# Patient Record
Sex: Female | Born: 1935 | Race: White | Hispanic: No | Smoking: Former smoker
Health system: Southern US, Community
[De-identification: ages and names within clinical notes are randomized; demographics above are authoritative.]

## PROBLEM LIST (undated history)

## (undated) DIAGNOSIS — Z853 Personal history of malignant neoplasm of breast: Secondary | ICD-10-CM

## (undated) DIAGNOSIS — F32A Depression, unspecified: Secondary | ICD-10-CM

## (undated) DIAGNOSIS — E785 Hyperlipidemia, unspecified: Secondary | ICD-10-CM

## (undated) DIAGNOSIS — F419 Anxiety disorder, unspecified: Secondary | ICD-10-CM

## (undated) DIAGNOSIS — H04123 Dry eye syndrome of bilateral lacrimal glands: Secondary | ICD-10-CM

## (undated) DIAGNOSIS — I7300 Raynaud's syndrome without gangrene: Secondary | ICD-10-CM

## (undated) DIAGNOSIS — D689 Coagulation defect, unspecified: Secondary | ICD-10-CM

## (undated) DIAGNOSIS — J439 Emphysema, unspecified: Secondary | ICD-10-CM

## (undated) DIAGNOSIS — I252 Old myocardial infarction: Secondary | ICD-10-CM

## (undated) DIAGNOSIS — R55 Syncope and collapse: Secondary | ICD-10-CM

## (undated) DIAGNOSIS — R0600 Dyspnea, unspecified: Secondary | ICD-10-CM

## (undated) DIAGNOSIS — Z955 Presence of coronary angioplasty implant and graft: Secondary | ICD-10-CM

## (undated) DIAGNOSIS — R911 Solitary pulmonary nodule: Secondary | ICD-10-CM

## (undated) DIAGNOSIS — D369 Benign neoplasm, unspecified site: Secondary | ICD-10-CM

## (undated) DIAGNOSIS — F329 Major depressive disorder, single episode, unspecified: Secondary | ICD-10-CM

## (undated) DIAGNOSIS — C801 Malignant (primary) neoplasm, unspecified: Secondary | ICD-10-CM

## (undated) DIAGNOSIS — M1990 Unspecified osteoarthritis, unspecified site: Secondary | ICD-10-CM

## (undated) DIAGNOSIS — I2510 Atherosclerotic heart disease of native coronary artery without angina pectoris: Secondary | ICD-10-CM

## (undated) DIAGNOSIS — I219 Acute myocardial infarction, unspecified: Secondary | ICD-10-CM

## (undated) DIAGNOSIS — I6529 Occlusion and stenosis of unspecified carotid artery: Secondary | ICD-10-CM

## (undated) DIAGNOSIS — S9304XA Dislocation of right ankle joint, initial encounter: Secondary | ICD-10-CM

## (undated) DIAGNOSIS — I10 Essential (primary) hypertension: Secondary | ICD-10-CM

## (undated) DIAGNOSIS — K5790 Diverticulosis of intestine, part unspecified, without perforation or abscess without bleeding: Secondary | ICD-10-CM

## (undated) HISTORY — PX: TYMPANOSTOMY TUBE PLACEMENT: SHX32

## (undated) HISTORY — PX: CATARACT EXTRACTION W/ INTRAOCULAR LENS  IMPLANT, BILATERAL: SHX1307

## (undated) HISTORY — PX: OTHER SURGICAL HISTORY: SHX169

## (undated) HISTORY — DX: Benign neoplasm, unspecified site: D36.9

## (undated) HISTORY — DX: Syncope and collapse: R55

## (undated) HISTORY — DX: Atherosclerotic heart disease of native coronary artery without angina pectoris: I25.10

## (undated) HISTORY — DX: Raynaud's syndrome without gangrene: I73.00

## (undated) HISTORY — DX: Depression, unspecified: F32.A

## (undated) HISTORY — DX: Major depressive disorder, single episode, unspecified: F32.9

## (undated) HISTORY — DX: Coagulation defect, unspecified: D68.9

## (undated) HISTORY — DX: Diverticulosis of intestine, part unspecified, without perforation or abscess without bleeding: K57.90

## (undated) HISTORY — DX: Hyperlipidemia, unspecified: E78.5

## (undated) HISTORY — DX: Unspecified osteoarthritis, unspecified site: M19.90

## (undated) HISTORY — DX: Acute myocardial infarction, unspecified: I21.9

## (undated) HISTORY — DX: Essential (primary) hypertension: I10

## (undated) HISTORY — PX: CORONARY ANGIOPLASTY WITH STENT PLACEMENT: SHX49

## (undated) HISTORY — DX: Malignant (primary) neoplasm, unspecified: C80.1

## (undated) HISTORY — PX: INTRAUTERINE DEVICE INSERTION: SHX323

---

## 2004-03-12 DIAGNOSIS — D369 Benign neoplasm, unspecified site: Secondary | ICD-10-CM

## 2004-03-12 HISTORY — DX: Benign neoplasm, unspecified site: D36.9

## 2007-01-11 DIAGNOSIS — I252 Old myocardial infarction: Secondary | ICD-10-CM

## 2007-01-11 HISTORY — DX: Old myocardial infarction: I25.2

## 2007-08-10 ENCOUNTER — Encounter (HOSPITAL_COMMUNITY): Admission: RE | Admit: 2007-08-10 | Discharge: 2007-11-08 | Payer: Self-pay | Admitting: Cardiology

## 2007-11-10 ENCOUNTER — Encounter (HOSPITAL_COMMUNITY): Admission: RE | Admit: 2007-11-10 | Discharge: 2008-02-08 | Payer: Self-pay | Admitting: Cardiology

## 2008-02-10 ENCOUNTER — Encounter (HOSPITAL_COMMUNITY): Admission: RE | Admit: 2008-02-10 | Discharge: 2008-05-08 | Payer: Self-pay | Admitting: Cardiology

## 2008-05-12 ENCOUNTER — Encounter (HOSPITAL_COMMUNITY): Admission: RE | Admit: 2008-05-12 | Discharge: 2008-08-10 | Payer: Self-pay | Admitting: Cardiology

## 2008-06-27 ENCOUNTER — Encounter: Admission: RE | Admit: 2008-06-27 | Discharge: 2008-06-27 | Payer: Self-pay | Admitting: Internal Medicine

## 2008-07-31 ENCOUNTER — Ambulatory Visit: Payer: Self-pay | Admitting: Surgery

## 2008-08-11 ENCOUNTER — Encounter (HOSPITAL_COMMUNITY): Admission: RE | Admit: 2008-08-11 | Discharge: 2008-11-09 | Payer: Self-pay | Admitting: Cardiology

## 2008-11-10 ENCOUNTER — Encounter (HOSPITAL_COMMUNITY): Admission: RE | Admit: 2008-11-10 | Discharge: 2009-02-08 | Payer: Self-pay | Admitting: Cardiology

## 2009-02-09 ENCOUNTER — Encounter (HOSPITAL_COMMUNITY): Admission: RE | Admit: 2009-02-09 | Discharge: 2009-05-09 | Payer: Self-pay | Admitting: Cardiology

## 2009-04-09 ENCOUNTER — Encounter: Admission: RE | Admit: 2009-04-09 | Discharge: 2009-05-11 | Payer: Self-pay | Admitting: Internal Medicine

## 2009-04-13 ENCOUNTER — Encounter: Admission: RE | Admit: 2009-04-13 | Discharge: 2009-04-13 | Payer: Self-pay | Admitting: Cardiology

## 2009-04-19 ENCOUNTER — Inpatient Hospital Stay (HOSPITAL_COMMUNITY): Admission: AD | Admit: 2009-04-19 | Discharge: 2009-04-20 | Payer: Self-pay | Admitting: Cardiology

## 2009-05-17 ENCOUNTER — Encounter: Admission: RE | Admit: 2009-05-17 | Discharge: 2009-06-04 | Payer: Self-pay | Admitting: Internal Medicine

## 2009-06-07 ENCOUNTER — Encounter (HOSPITAL_COMMUNITY): Admission: RE | Admit: 2009-06-07 | Discharge: 2009-09-05 | Payer: Self-pay | Admitting: Cardiology

## 2009-07-31 ENCOUNTER — Encounter: Admission: RE | Admit: 2009-07-31 | Discharge: 2009-07-31 | Payer: Self-pay | Admitting: Internal Medicine

## 2009-08-02 ENCOUNTER — Encounter: Admission: RE | Admit: 2009-08-02 | Discharge: 2009-08-02 | Payer: Self-pay | Admitting: Internal Medicine

## 2009-08-14 ENCOUNTER — Encounter: Admission: RE | Admit: 2009-08-14 | Discharge: 2009-08-14 | Payer: Self-pay | Admitting: Internal Medicine

## 2009-08-21 ENCOUNTER — Encounter: Admission: RE | Admit: 2009-08-21 | Discharge: 2009-08-21 | Payer: Self-pay | Admitting: Internal Medicine

## 2009-09-04 ENCOUNTER — Encounter: Admission: RE | Admit: 2009-09-04 | Discharge: 2009-09-04 | Payer: Self-pay | Admitting: General Surgery

## 2009-09-04 ENCOUNTER — Ambulatory Visit (HOSPITAL_COMMUNITY): Admission: RE | Admit: 2009-09-04 | Discharge: 2009-09-04 | Payer: Self-pay | Admitting: General Surgery

## 2009-09-04 HISTORY — PX: OTHER SURGICAL HISTORY: SHX169

## 2009-09-05 ENCOUNTER — Ambulatory Visit: Payer: Self-pay | Admitting: Oncology

## 2009-09-20 ENCOUNTER — Encounter: Payer: Self-pay | Admitting: Pulmonary Disease

## 2009-09-20 ENCOUNTER — Ambulatory Visit (HOSPITAL_COMMUNITY): Admission: RE | Admit: 2009-09-20 | Discharge: 2009-09-20 | Payer: Self-pay | Admitting: Oncology

## 2009-09-28 ENCOUNTER — Ambulatory Visit: Admission: RE | Admit: 2009-09-28 | Discharge: 2009-11-05 | Payer: Self-pay | Admitting: Radiation Oncology

## 2009-10-02 ENCOUNTER — Encounter: Payer: Self-pay | Admitting: Pulmonary Disease

## 2009-11-16 ENCOUNTER — Ambulatory Visit: Payer: Self-pay | Admitting: Pulmonary Disease

## 2009-11-16 DIAGNOSIS — I10 Essential (primary) hypertension: Secondary | ICD-10-CM | POA: Insufficient documentation

## 2009-11-16 DIAGNOSIS — C50919 Malignant neoplasm of unspecified site of unspecified female breast: Secondary | ICD-10-CM | POA: Insufficient documentation

## 2009-11-16 DIAGNOSIS — J309 Allergic rhinitis, unspecified: Secondary | ICD-10-CM | POA: Insufficient documentation

## 2009-11-16 DIAGNOSIS — J438 Other emphysema: Secondary | ICD-10-CM

## 2009-11-16 DIAGNOSIS — I219 Acute myocardial infarction, unspecified: Secondary | ICD-10-CM | POA: Insufficient documentation

## 2009-11-16 DIAGNOSIS — J984 Other disorders of lung: Secondary | ICD-10-CM

## 2009-11-16 DIAGNOSIS — E785 Hyperlipidemia, unspecified: Secondary | ICD-10-CM

## 2009-12-14 ENCOUNTER — Ambulatory Visit: Payer: Self-pay | Admitting: Oncology

## 2009-12-18 LAB — COMPREHENSIVE METABOLIC PANEL
ALT: 25 U/L (ref 0–35)
AST: 19 U/L (ref 0–37)
Albumin: 4.1 g/dL (ref 3.5–5.2)
BUN: 11 mg/dL (ref 6–23)
Calcium: 10.1 mg/dL (ref 8.4–10.5)
Creatinine, Ser: 1.01 mg/dL (ref 0.40–1.20)
Glucose, Bld: 121 mg/dL — ABNORMAL HIGH (ref 70–99)
Potassium: 4 mEq/L (ref 3.5–5.3)
Sodium: 142 mEq/L (ref 135–145)
Total Bilirubin: 0.4 mg/dL (ref 0.3–1.2)
Total Protein: 6.9 g/dL (ref 6.0–8.3)

## 2009-12-18 LAB — CBC WITH DIFFERENTIAL/PLATELET
BASO%: 0.9 % (ref 0.0–2.0)
Basophils Absolute: 0.1 10*3/uL (ref 0.0–0.1)
HGB: 14.4 g/dL (ref 11.6–15.9)
MCV: 90.8 fL (ref 79.5–101.0)
MONO%: 7.1 % (ref 0.0–14.0)
NEUT#: 3.8 10*3/uL (ref 1.5–6.5)
Platelets: 269 10*3/uL (ref 145–400)
lymph#: 1.3 10*3/uL (ref 0.9–3.3)

## 2009-12-25 ENCOUNTER — Encounter: Payer: Self-pay | Admitting: Pulmonary Disease

## 2010-01-09 ENCOUNTER — Encounter (HOSPITAL_COMMUNITY): Admission: RE | Admit: 2010-01-09 | Discharge: 2010-02-08 | Payer: Self-pay | Admitting: Pulmonary Disease

## 2010-01-29 ENCOUNTER — Ambulatory Visit: Payer: Self-pay | Admitting: Cardiology

## 2010-02-09 ENCOUNTER — Encounter (HOSPITAL_COMMUNITY)
Admission: RE | Admit: 2010-02-09 | Discharge: 2010-05-10 | Payer: Self-pay | Source: Home / Self Care | Attending: Pulmonary Disease | Admitting: Pulmonary Disease

## 2010-04-25 ENCOUNTER — Encounter: Payer: Self-pay | Admitting: Pulmonary Disease

## 2010-05-14 ENCOUNTER — Ambulatory Visit: Payer: Self-pay | Admitting: Cardiology

## 2010-05-15 ENCOUNTER — Ambulatory Visit
Admission: RE | Admit: 2010-05-15 | Discharge: 2010-05-15 | Payer: Self-pay | Source: Home / Self Care | Attending: Pulmonary Disease | Admitting: Pulmonary Disease

## 2010-05-20 ENCOUNTER — Encounter (HOSPITAL_COMMUNITY)
Admission: RE | Admit: 2010-05-20 | Discharge: 2010-06-11 | Payer: Self-pay | Source: Home / Self Care | Attending: Cardiology | Admitting: Cardiology

## 2010-06-11 NOTE — Letter (Signed)
Summary:  Cancer Center  [facility]   Imported By: Sherian Rein 01/28/2010 09:01:36  _____________________________________________________________________  External Attachment:    Type:   Image     Comment:   External Document

## 2010-06-11 NOTE — Letter (Signed)
Summary: [facility]  [facility]   Imported By: Lester [place] 01/01/2010 09:23:16  _____________________________________________________________________  External Attachment:    Type:   Image     Comment:   External Document

## 2010-06-11 NOTE — Assessment & Plan Note (Signed)
Summary: copd/apc   Visit Type:  Initial Consult Copy to:  pcp Primary Provider/Referring Provider:  Dr. Timothy Lasso  CC:  Pt here for pulmonary consult.  History of Present Illness: [name] ex smoker for evaluation of dyspnea. Accompanied by daughter & sister who report that she seems to be breathing hard/ pursed lip breathing on exertion over short distances. She seems to be able to walk in a store or inthe parking lot but cannot climb stairs. She smoked upto 2 PP for > 60 Pys before quitting in 2009 when she another MI. She denies wheezing, nocturnal dyspnea, frequent colds. Spirometry showed FEV1 of 65% c/w mild obstruction (surprisingly better than expected). CTc hest obtained by dr Darnelle Catalan on 09/20/09 was reviewed & shows  moderate hiatal hernia & scattered < 5 mm nodules which are indeterminate. She is maintained ona  regimen of spiriva & ventolin HFA - poor technique  Preventive Screening-Counseling & Management  Alcohol-Tobacco     Smoking Status: quit     Packs/Day: 2.0     Year Started: 1955     Year Quit: 2009  Current Medications (verified): 1)  Multivitamins   Tabs (Multiple Vitamin) .... Take 1 Tablet By Mouth Once A Day 2)  Ecotrin 325 Mg Tbec (Aspirin) .... Take 1 Tablet By Mouth Once A Day 3)  Diovan Hct 80-12.5 Mg Tabs (Valsartan-Hydrochlorothiazide) .... Take 1/2 Tablet By Mouth Once A Day 4)  Plavix 75 Mg Tabs (Clopidogrel Bisulfate) .... Take 1 Tablet By Mouth Once A Day 5)  Amlodipine Besylate 5 Mg Tabs (Amlodipine Besylate) .... Take 1 Tablet By Mouth Once A Day 6)  Metoprolol Succinate 50 Mg Xr24h-Tab (Metoprolol Succinate) .... Take 1 Tablet By Mouth Two Times A Day 7)  Spiriva Handihaler 18 Mcg Caps (Tiotropium Bromide Monohydrate) .... Inhale Contents of One Capsule By Mouth Once Daily 8)  Pletal 50 Mg Tabs (Cilostazol) .... Take 1 Tablet By Mouth Two Times A Day 9)  Fish Oil 1200 Mg Caps (Omega-3 Fatty Acids) .... Take 1 Tablet By Mouth Two Times A Day 10)  Lipitor  40 Mg Tabs (Atorvastatin Calcium) .... Take 1 Tab By Mouth At Bedtime 11)  Calcium Pyruvate 750 Mg Caps (Misc Natural Products) .... Take 1 Tablet By Mouth Once A Day 12)  Alprazolam 1 Mg Tabs (Alprazolam) .... 2.5mg  Daily 13)  Remeron 30 Mg Tabs (Mirtazapine) .... Take 1 Tablet By Mouth Once A Day 14)  Fosamax 70 Mg Tabs (Alendronate Sodium) .... Take 1 Tablet By Mouth Once A Week 15)  Ventolin Hfa 108 (90 Base) Mcg/act Aers (Albuterol Sulfate) .... As Needed 16)  Nitrostat 0.4 Mg Subl (Nitroglycerin) .... As Needed  Allergies (verified): 1)  ! Pcn 2)  ! Codeine 3)  ! 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 (quit 2009) Smoking Status:  quit Packs/Day:  2.0  Review of Systems       The patient complains of shortness of breath with activity, shortness of breath at rest, non-productive cough, irregular heartbeats, acid heartburn, indigestion, sneezing, and hand/feet swelling.  The patient denies productive cough, coughing up blood,  chest pain, loss of appetite, weight change, abdominal pain, difficulty swallowing, sore throat, tooth/dental problems, headaches, nasal congestion/difficulty breathing through nose, itching, ear ache, anxiety, depression, joint stiffness or pain, rash, change in color of mucus, and fever.    Vital Signs:  Patient profile:   77 year old female Height:      59 inches Weight:      145 pounds BMI:      29.39 O2 Sat:      98 % on Room air Temp:     98.2 degrees F oral Pulse rate:   68 / minute BP sitting:   130 / 88  (right arm) Cuff size:   regular  Vitals Entered By: Zackery Barefoot CMA (November 16, 2009 1:36 PM)  O2 Flow:  Room air  Serial Vital Signs/Assessments:  Comments: Ambulatory Pulse Oximetry  Resting; HR__72___    02 Sat__92%RA___  Lap1 (185 feet)   HR__92___   02 Sat__90%RA___ Lap2 (185 feet)   HR__94___   02 Sat__92%RA___    Lap3 (185 feet)   HR__92___   02 Sat__92%RA___  _x__Test Completed without Difficulty ___Test Stopped due to:  Zackery Barefoot CMA  November 16, 2009 3:29 PM    By: Zackery Barefoot CMA   CC: Pt here for pulmonary consult Comments Medications reviewed with patient Verified contact number and pharmacy with patient Zackery Barefoot CMA  November 16, 2009 1:37 PM    Physical Exam  Additional Exam:  Gen. Pleasant, well-nourished, in no distress, normal affect, pursed lip breathing ENT - no lesions, no post nasal drip Neck: No JVD, no thyromegaly, no carotid bruits Lungs: no use of accessory muscles, no dullness to percussion, decreased BL without rales or rhonchi  Cardiovascular: Rhythm regular, heart sounds  normal, no murmurs or gallops, no peripheral edema Abdomen: soft and non-tender, no hepatosplenomegaly, BS normal. Musculoskeletal: No deformities, no cyanosis or clubbing Neuro:  alert, non focal     Pulmonary Function Test Date: 11/16/2009 2:46 PM Gender: Female  Pre-Spirometry FVC    Value: 2.14 L/min   % Pred: 94.80 % FEV1    Value: 1.69 L     Pred: 1.69 L     % Pred: 99.80 % FEV1/FVC  Value: 78.59 %     % Pred: 104.40 %  Impression & Recommendations:  Problem # 1:  EMPHYSEMA (ICD-492.8) Dyspnea out of proportion to degree of airway obstruction likley reflects some level of deconditioning. She did not desaturate on exertion. Stay on spiriva Use MDI with spacer for rescue only PU[facility] rehab Orders: Rehabilitation Referral  (Rehab) Consultation Level IV (16109) Spirometry w/Graph (94010) HFA Instruction [phone])  Problem # 2:  PULMONARY NODULE (ICD-518.89) U screening CT in 6 months with special attention to the nodules noted in 5/11 Orders: Radiology Referral (Radiology) Consultation Level IV (09811) Spirometry w/Graph (94010) HFA Instruction [phone])  Medications Added to Medication List This Visit: 1)  Multivitamins Tabs (Multiple vitamin) .... Take 1 tablet by mouth once a day 2)  Ecotrin 325 Mg Tbec (Aspirin) .... Take 1 tablet by mouth once a day 3)  Diovan Hct 80-12.5 Mg Tabs (Valsartan-hydrochlorothiazide) .... Take 1/2 tablet by mouth once a day 4)  Plavix 75 Mg Tabs (Clopidogrel bisulfate) .... Take 1 tablet by mouth once a day 5)  Amlodipine Besylate 5 Mg Tabs (Amlodipine besylate) .... Take 1 tablet by mouth once a day 6)  Metoprolol Succinate 50 Mg Xr24h-tab (Metoprolol succinate) .... Take 1 tablet by mouth two times a  day 7)  Spiriva Handihaler 18 Mcg Caps (Tiotropium bromide monohydrate) .... Inhale contents of one capsule by mouth once daily 8)  Pletal 50 Mg Tabs (Cilostazol) .... Take 1 tablet by mouth two times a day 9)  Fish Oil 1200 Mg Caps (Omega-3 fatty acids) .... Take 1 tablet by mouth two times a day 10)  Lipitor 40 Mg Tabs (Atorvastatin calcium) .... Take 1 tab by mouth at bedtime 11)  Calcium Pyruvate 750 Mg Caps (Misc natural products) .... Take 1 tablet by mouth once a day 12)  Alprazolam 1 Mg Tabs (Alprazolam) .... 2.5mg  daily 13)  Remeron 30 Mg Tabs (Mirtazapine) .... Take 1 tablet by mouth once a day 14)  Fosamax 70 Mg Tabs (Alendronate sodium) .... Take 1 tablet by mouth once a week 15)  Ventolin Hfa 108 (90 Base) Mcg/act Aers (Albuterol sulfate) .... As needed 16)  Nitrostat 0.4 Mg Subl (Nitroglycerin) .... As needed 17)  Aerochamber Plus Misc (Spacer/aero-holding chambers) .... Use with inhaler as directed  Patient Instructions: 1)  Breathing test shows slight decrease  in lung function 2)  Rehab program is recommended 3)  Spacer to use with rescue inhaler 4)  Stay on spiriva 5)  Please schedule a follow-up appointment in 6 months after CT scan 6)  CO[facility] - dr Timothy Lasso Prescriptions: AEROCHAMBER PLUS  MISC (SPACER/AERO-HOLDING CHAMBERS) use with inhaler as directed  #1 x 0   Entered by:   Zackery Barefoot CMA   Authorized by:   Comer Locket. [name] MD   Signed by:   Zackery Barefoot CMA on 11/16/2009   Method used:   Printed then faxed to ...       [facility] [address] [phone]* (retail)       [address]       [place], [place]  [address]       Ph: [phone] or [phone]       Fax: [phone]   RxID:   [id number]    Immunization History:  Influenza Immunization History:    Influenza:  historical (01/15/2009)    CardioPerfect Spirometry  ID: [id number] Patient: NAOMII, KREGER DOB: 10-20-1933 Age: 77 Years Old Sex: Female Race: White Physician: Kelechi Orgeron Height: 59 Weight: 145 PPD: 2.0 Status: Unconfirmed Recorded: 11/16/2009 2:46 PM  Parameter  Measured Predicted %Predicted FVC     2.14        2.26        94.80 FEV1     1.69        1.69        99.80 FEV1%   78.59        75.30        104.40 PEF    4.40        4.63        95.10   Interpretation: mild airway obstruction. FEV1 65% on good effort. The documented FEV1 of 99% does not reflect a good effort.

## 2010-06-12 ENCOUNTER — Encounter (HOSPITAL_COMMUNITY): Payer: Self-pay | Attending: Cardiology

## 2010-06-12 DIAGNOSIS — J984 Other disorders of lung: Secondary | ICD-10-CM | POA: Insufficient documentation

## 2010-06-12 DIAGNOSIS — E785 Hyperlipidemia, unspecified: Secondary | ICD-10-CM | POA: Insufficient documentation

## 2010-06-12 DIAGNOSIS — R0989 Other specified symptoms and signs involving the circulatory and respiratory systems: Secondary | ICD-10-CM | POA: Insufficient documentation

## 2010-06-12 DIAGNOSIS — I10 Essential (primary) hypertension: Secondary | ICD-10-CM | POA: Insufficient documentation

## 2010-06-12 DIAGNOSIS — I252 Old myocardial infarction: Secondary | ICD-10-CM | POA: Insufficient documentation

## 2010-06-12 DIAGNOSIS — Z853 Personal history of malignant neoplasm of breast: Secondary | ICD-10-CM | POA: Insufficient documentation

## 2010-06-12 DIAGNOSIS — R0609 Other forms of dyspnea: Secondary | ICD-10-CM | POA: Insufficient documentation

## 2010-06-12 DIAGNOSIS — J438 Other emphysema: Secondary | ICD-10-CM | POA: Insufficient documentation

## 2010-06-12 DIAGNOSIS — Z5189 Encounter for other specified aftercare: Secondary | ICD-10-CM | POA: Insufficient documentation

## 2010-06-12 DIAGNOSIS — Z87891 Personal history of nicotine dependence: Secondary | ICD-10-CM | POA: Insufficient documentation

## 2010-06-13 NOTE — Assessment & Plan Note (Signed)
Summary: 6 months/apc   Copy to:  pcp Primary Provider/Referring Provider:  Dr. Timothy Lasso  CC:  6 month followup.  Pt states that her dyspnea is some improved since last seen and completed pulm rehab.  Still has occ DOE.  No new complaints today.Melinda Mora  History of Present Illness: [name], breast CA survivor, ex smoker for FU of gold stg 2 COPD & stable pulm nodules since may'11. Accompanied by daughter & sister who report that she seems to be breathing hard/ pursed lip breathing on exertion over short distances. She seems to be able to walk in a store or inthe parking lot but cannot climb stairs. She smoked upto 2 PP for > 60 Pys before quitting in 2009 when she another MI. She denies wheezing, nocturnal dyspnea, frequent colds. Spirometry showed FEV1 of 65% c/w mild obstruction (surprisingly better than expected). CTc hest obtained by dr Darnelle Catalan on 09/20/09 was reviewed & shows  moderate hiatal hernia & scattered < 5 mm nodules which are indeterminate. She is maintained on a  regimen of spiriva & ventolin HFA - poor technique  May 15, 2010 2:20 PM  Completed pulm rehab, breathing is better FU CT chest - stable sub-cm nodules.  Current Medications (verified): 1)  Multivitamins   Tabs (Multiple Vitamin) .... Take 1 Tablet By Mouth Once A Day 2)  Ecotrin 325 Mg Tbec (Aspirin) .... Take 1 Tablet By Mouth Once A Day 3)  Diovan Hct 80-12.5 Mg Tabs (Valsartan-Hydrochlorothiazide) .... Take 1/2 Tablet By Mouth Once A Day 4)  Plavix 75 Mg Tabs (Clopidogrel Bisulfate) .... Take 1 Tablet By Mouth Once A Day 5)  Amlodipine Besylate 5 Mg Tabs (Amlodipine Besylate) .... Take 1 Tablet By Mouth Once A Day 6)  Metoprolol Succinate 50 Mg Xr24h-Tab (Metoprolol Succinate) .... Take 1 Tablet By Mouth Two Times A Day 7)  Spiriva Handihaler 18 Mcg Caps (Tiotropium Bromide Monohydrate) .... Inhale Contents of One Capsule By Mouth Once Daily 8)  Pletal 50 Mg Tabs (Cilostazol) .... Take 1 Tablet By Mouth Two Times A  Day 9)  Fish Oil 1200 Mg Caps (Omega-3 Fatty Acids) .... Take 1 Tablet By Mouth Two Times A Day 10)  Lipitor 40 Mg Tabs (Atorvastatin Calcium) .... Take 1 Tab By Mouth At Bedtime 11)  Calcium Pyruvate 750 Mg Caps (Misc Natural Products) .... Take 1 Tablet By Mouth Once A Day 12)  Alprazolam 1 Mg Tabs (Alprazolam) .Melinda Mora.. 1 Two Times A Day As Needed 13)  Remeron 30 Mg Tabs (Mirtazapine) .... Take 1 Tablet By Mouth Once A Day 14)  Fosamax 70 Mg Tabs (Alendronate Sodium) .... Take 1 Tablet By Mouth Once A Week 15)  Ventolin Hfa 108 (90 Base) Mcg/act Aers (Albuterol Sulfate) .... As Needed 16)  Nitrostat 0.4 Mg Subl (Nitroglycerin) .... As Needed  Allergies (verified): 1)  ! Pcn 2)  ! Codeine 3)  ! 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 (quit 2009)  Review of Systems       The patient complains of dyspnea on exertion.  The patient denies anorexia, fever, weight loss, weight gain, vision loss, decreased hearing, hoarseness, chest pain, syncope, peripheral edema, prolonged cough, headaches, hemoptysis, abdominal pain, melena, hematochezia, severe indigestion/heartburn, hematuria,  muscle weakness, suspicious skin lesions, transient blindness, difficulty walking, depression, unusual weight change, abnormal bleeding, enlarged lymph nodes, and angioedema.    Vital Signs:  Patient profile:   77 year old female Weight:      153 pounds O2 Sat:      96 % on Room air Temp:     97.5 degrees F oral Pulse rate:   59 / minute BP sitting:   116 / 70  (left arm)  Vitals Entered By: Vernie Murders (May 15, 2010 1:59  PM)  O2 Flow:  Room air  Physical Exam  Additional Exam:  Gen. Pleasant, well-nourished, in no distress, normal affect, pursed lip breathing ENT - no lesions, no post nasal drip Neck: No JVD, no thyromegaly, no carotid bruits Lungs: no use of accessory muscles, no dullness to percussion, decreased BL without rales or rhonchi  Cardiovascular: Rhythm regular, heart sounds  normal, no murmurs or gallops, no peripheral edema Musculoskeletal: No deformities, no cyanosis or clubbing      Impression & Recommendations:  Problem # 1:  EMPHYSEMA (ICD-492.8)  stay on spiriva completed pulm rehab with good results can FU with dr Timothy Lasso from here on for this. She desires to cut down on her doctor visits.  Orders: Est. Patient Level III (16109)  Problem # 2:  PULMONARY NODULE (ICD-518.89) Needs FU CT in 6 mnths - can FU with dr Darnelle Catalan Orders: Radiology Referral (Radiology) Est. Patient Level III [phone])  Medications Added to Medication List This Visit: 1)  Alprazolam 1 Mg Tabs (Alprazolam) .Melinda Mora.. 1 two times a day as needed  Patient Instructions: 1)  Copy sent to: Dr Timothy Lasso, Dr Darnelle Catalan 2)  Please schedule a follow-up appointment in 6 months. 3)  A Chest CT WITHOUT Contrast has been recommended in July. Your imaging study may require preauthorization.  4)  Stay on spiriva   Immunization History:  Influenza Immunization History:    Influenza:  historical (03/12/2010)

## 2010-06-14 ENCOUNTER — Encounter (HOSPITAL_COMMUNITY): Payer: Self-pay

## 2010-06-17 ENCOUNTER — Encounter (HOSPITAL_COMMUNITY): Payer: Self-pay

## 2010-06-18 ENCOUNTER — Other Ambulatory Visit: Payer: Self-pay | Admitting: Oncology

## 2010-06-18 ENCOUNTER — Encounter (HOSPITAL_BASED_OUTPATIENT_CLINIC_OR_DEPARTMENT_OTHER): Payer: Medicare Other | Admitting: Oncology

## 2010-06-18 DIAGNOSIS — C50919 Malignant neoplasm of unspecified site of unspecified female breast: Secondary | ICD-10-CM

## 2010-06-18 DIAGNOSIS — I2510 Atherosclerotic heart disease of native coronary artery without angina pectoris: Secondary | ICD-10-CM

## 2010-06-18 DIAGNOSIS — M810 Age-related osteoporosis without current pathological fracture: Secondary | ICD-10-CM

## 2010-06-18 DIAGNOSIS — C50419 Malignant neoplasm of upper-outer quadrant of unspecified female breast: Secondary | ICD-10-CM

## 2010-06-18 LAB — COMPREHENSIVE METABOLIC PANEL
ALT: 27 U/L (ref 0–35)
AST: 25 U/L (ref 0–37)
Albumin: 3.6 g/dL (ref 3.5–5.2)
Alkaline Phosphatase: 104 U/L (ref 39–117)
Chloride: 107 mEq/L (ref 96–112)
Creatinine, Ser: 1.06 mg/dL (ref 0.40–1.20)
Total Bilirubin: 0.7 mg/dL (ref 0.3–1.2)

## 2010-06-18 LAB — CBC WITH DIFFERENTIAL/PLATELET
Basophils Absolute: 0 10*3/uL (ref 0.0–0.1)
HCT: 43.5 % (ref 34.8–46.6)
MCH: 30.6 pg (ref 25.1–34.0)
Platelets: 263 10*3/uL (ref 145–400)
RBC: 4.77 10*6/uL (ref 3.70–5.45)
RDW: 14.9 % — ABNORMAL HIGH (ref 11.2–14.5)
WBC: 6.8 10*3/uL (ref 3.9–10.3)
lymph#: 1.4 10*3/uL (ref 0.9–3.3)

## 2010-06-19 ENCOUNTER — Encounter (HOSPITAL_COMMUNITY): Payer: Self-pay

## 2010-06-21 ENCOUNTER — Encounter (HOSPITAL_COMMUNITY): Payer: Self-pay

## 2010-06-24 ENCOUNTER — Encounter (HOSPITAL_COMMUNITY): Payer: Self-pay

## 2010-06-25 ENCOUNTER — Encounter (HOSPITAL_BASED_OUTPATIENT_CLINIC_OR_DEPARTMENT_OTHER): Payer: Medicare Other | Admitting: Oncology

## 2010-06-25 ENCOUNTER — Encounter: Payer: Self-pay | Admitting: Pulmonary Disease

## 2010-06-25 DIAGNOSIS — C50119 Malignant neoplasm of central portion of unspecified female breast: Secondary | ICD-10-CM

## 2010-06-25 DIAGNOSIS — J984 Other disorders of lung: Secondary | ICD-10-CM

## 2010-06-25 DIAGNOSIS — Z171 Estrogen receptor negative status [ER-]: Secondary | ICD-10-CM

## 2010-06-26 ENCOUNTER — Ambulatory Visit: Payer: PRIVATE HEALTH INSURANCE | Admitting: Radiation Oncology

## 2010-06-26 ENCOUNTER — Encounter (HOSPITAL_COMMUNITY): Payer: Self-pay

## 2010-06-26 ENCOUNTER — Other Ambulatory Visit: Payer: Self-pay | Admitting: Oncology

## 2010-06-26 DIAGNOSIS — Z9889 Other specified postprocedural states: Secondary | ICD-10-CM

## 2010-06-27 ENCOUNTER — Ambulatory Visit: Payer: Self-pay | Attending: Radiation Oncology | Admitting: Radiation Oncology

## 2010-06-28 ENCOUNTER — Encounter (HOSPITAL_COMMUNITY): Payer: Self-pay

## 2010-07-01 ENCOUNTER — Encounter: Payer: Self-pay | Admitting: Pulmonary Disease

## 2010-07-01 ENCOUNTER — Encounter (HOSPITAL_COMMUNITY): Payer: Self-pay

## 2010-07-03 ENCOUNTER — Encounter (HOSPITAL_COMMUNITY): Payer: Self-pay

## 2010-07-05 ENCOUNTER — Encounter (HOSPITAL_COMMUNITY): Payer: Self-pay

## 2010-07-08 ENCOUNTER — Encounter (HOSPITAL_COMMUNITY): Payer: Self-pay

## 2010-07-10 ENCOUNTER — Encounter (HOSPITAL_COMMUNITY): Payer: Self-pay

## 2010-07-12 ENCOUNTER — Encounter (HOSPITAL_COMMUNITY): Payer: Self-pay | Attending: Cardiology

## 2010-07-12 DIAGNOSIS — Z853 Personal history of malignant neoplasm of breast: Secondary | ICD-10-CM | POA: Insufficient documentation

## 2010-07-12 DIAGNOSIS — E785 Hyperlipidemia, unspecified: Secondary | ICD-10-CM | POA: Insufficient documentation

## 2010-07-12 DIAGNOSIS — I252 Old myocardial infarction: Secondary | ICD-10-CM | POA: Insufficient documentation

## 2010-07-12 DIAGNOSIS — I10 Essential (primary) hypertension: Secondary | ICD-10-CM | POA: Insufficient documentation

## 2010-07-12 DIAGNOSIS — R0609 Other forms of dyspnea: Secondary | ICD-10-CM | POA: Insufficient documentation

## 2010-07-12 DIAGNOSIS — Z5189 Encounter for other specified aftercare: Secondary | ICD-10-CM | POA: Insufficient documentation

## 2010-07-12 DIAGNOSIS — R0989 Other specified symptoms and signs involving the circulatory and respiratory systems: Secondary | ICD-10-CM | POA: Insufficient documentation

## 2010-07-12 DIAGNOSIS — J438 Other emphysema: Secondary | ICD-10-CM | POA: Insufficient documentation

## 2010-07-12 DIAGNOSIS — J984 Other disorders of lung: Secondary | ICD-10-CM | POA: Insufficient documentation

## 2010-07-12 DIAGNOSIS — Z87891 Personal history of nicotine dependence: Secondary | ICD-10-CM | POA: Insufficient documentation

## 2010-07-15 ENCOUNTER — Encounter (HOSPITAL_COMMUNITY): Payer: Self-pay

## 2010-07-17 ENCOUNTER — Encounter (HOSPITAL_COMMUNITY): Payer: Self-pay

## 2010-07-18 NOTE — Procedures (Signed)
Summary: 6MWT/[place]  6MWT/[place]   Imported By: Lester [place] 07/11/2010 10:03:23  _____________________________________________________________________  External Attachment:    Type:   Image     Comment:   External Document

## 2010-07-18 NOTE — Miscellaneous (Signed)
Summary: Progress note/Pulmonary/[facility]  Progress note/Pulmonary/[place]   Imported By: Lester [place] 07/11/2010 10:01:51  _____________________________________________________________________  External Attachment:    Type:   Image     Comment:   External Document

## 2010-07-19 ENCOUNTER — Encounter (HOSPITAL_COMMUNITY): Payer: Self-pay

## 2010-07-22 ENCOUNTER — Encounter (HOSPITAL_COMMUNITY): Payer: Self-pay

## 2010-07-23 NOTE — Letter (Signed)
Summary: [facility]   [facility]   Imported By: Kassie [place] 07/19/2010 10:59:00  _____________________________________________________________________  External Attachment:    Type:   Image     Comment:   External Document

## 2010-07-24 ENCOUNTER — Encounter (HOSPITAL_COMMUNITY): Payer: Self-pay

## 2010-07-26 ENCOUNTER — Encounter (HOSPITAL_COMMUNITY): Payer: Self-pay

## 2010-07-29 ENCOUNTER — Encounter (HOSPITAL_COMMUNITY): Payer: Self-pay

## 2010-07-30 LAB — COMPREHENSIVE METABOLIC PANEL
ALT: 28 U/L (ref 0–35)
AST: 26 U/L (ref 0–37)
Albumin: 3.6 g/dL (ref 3.5–5.2)
Alkaline Phosphatase: 93 U/L (ref 39–117)
BUN: 15 mg/dL (ref 6–23)
CO2: 28 mEq/L (ref 19–32)
Calcium: 10.1 mg/dL (ref 8.4–10.5)
Chloride: 105 mEq/L (ref 96–112)
Creatinine, Ser: 0.88 mg/dL (ref 0.4–1.2)
GFR calc non Af Amer: >60 mL/min (ref >60)
Glucose, Bld: 85 mg/dL (ref 70–99)
Total Bilirubin: 0.5 mg/dL (ref 0.3–1.2)
Total Protein: 6.7 g/dL (ref 6.0–8.3)

## 2010-07-30 LAB — URINALYSIS, ROUTINE W REFLEX MICROSCOPIC
Glucose, UA: NEGATIVE mg/dL
Nitrite: NEGATIVE
Specific Gravity, Urine: 1.014 (ref 1.005–1.030)

## 2010-07-30 LAB — CBC: MCV: 90.6 fL (ref 78.0–100.0)

## 2010-07-30 LAB — DIFFERENTIAL
Eosinophils Absolute: 0.1 10*3/uL (ref 0.0–0.7)
Eosinophils Relative: 2 % (ref 0–5)
Neutrophils Relative %: 59 % (ref 43–77)

## 2010-07-30 LAB — URINE MICROSCOPIC-ADD ON

## 2010-07-30 LAB — BUN: BUN: 15 mg/dL (ref 6–23)

## 2010-07-31 ENCOUNTER — Encounter (HOSPITAL_COMMUNITY): Payer: Self-pay

## 2010-08-02 ENCOUNTER — Encounter (HOSPITAL_COMMUNITY): Payer: Self-pay

## 2010-08-05 ENCOUNTER — Encounter (HOSPITAL_COMMUNITY): Payer: Self-pay

## 2010-08-06 ENCOUNTER — Ambulatory Visit
Admission: RE | Admit: 2010-08-06 | Discharge: 2010-08-06 | Disposition: A | Discharge status: Home / Self Care | Payer: PRIVATE HEALTH INSURANCE | Source: Ambulatory Visit | Attending: Oncology | Admitting: Oncology

## 2010-08-06 DIAGNOSIS — Z9889 Other specified postprocedural states: Secondary | ICD-10-CM

## 2010-08-07 ENCOUNTER — Encounter (HOSPITAL_COMMUNITY): Payer: Self-pay

## 2010-08-09 ENCOUNTER — Encounter (HOSPITAL_COMMUNITY): Payer: Self-pay

## 2010-08-12 ENCOUNTER — Encounter (HOSPITAL_COMMUNITY): Payer: Self-pay | Attending: Cardiology

## 2010-08-12 DIAGNOSIS — I10 Essential (primary) hypertension: Secondary | ICD-10-CM | POA: Insufficient documentation

## 2010-08-12 DIAGNOSIS — E785 Hyperlipidemia, unspecified: Secondary | ICD-10-CM | POA: Insufficient documentation

## 2010-08-12 DIAGNOSIS — I252 Old myocardial infarction: Secondary | ICD-10-CM | POA: Insufficient documentation

## 2010-08-12 DIAGNOSIS — Z87891 Personal history of nicotine dependence: Secondary | ICD-10-CM | POA: Insufficient documentation

## 2010-08-12 DIAGNOSIS — R0989 Other specified symptoms and signs involving the circulatory and respiratory systems: Secondary | ICD-10-CM | POA: Insufficient documentation

## 2010-08-12 DIAGNOSIS — R0609 Other forms of dyspnea: Secondary | ICD-10-CM | POA: Insufficient documentation

## 2010-08-12 DIAGNOSIS — J438 Other emphysema: Secondary | ICD-10-CM | POA: Insufficient documentation

## 2010-08-12 DIAGNOSIS — Z853 Personal history of malignant neoplasm of breast: Secondary | ICD-10-CM | POA: Insufficient documentation

## 2010-08-12 DIAGNOSIS — J984 Other disorders of lung: Secondary | ICD-10-CM | POA: Insufficient documentation

## 2010-08-12 DIAGNOSIS — Z5189 Encounter for other specified aftercare: Secondary | ICD-10-CM | POA: Insufficient documentation

## 2010-08-13 LAB — BASIC METABOLIC PANEL
BUN: 8 mg/dL (ref 6–23)
CO2: 22 mEq/L (ref 19–32)
Chloride: 111 mEq/L (ref 96–112)
Creatinine, Ser: 0.79 mg/dL (ref 0.4–1.2)
GFR calc Af Amer: >60 mL/min (ref >60)
GFR calc non Af Amer: >60 mL/min (ref >60)

## 2010-08-13 LAB — CBC
HCT: 41.1 % (ref 36.0–46.0)
Hemoglobin: 14 g/dL (ref 12.0–15.0)
MCV: 91.3 fL (ref 78.0–100.0)
Platelets: 248 10*3/uL (ref 150–400)
RBC: 4.5 MIL/uL (ref 3.87–5.11)
WBC: 7.6 10*3/uL (ref 4.0–10.5)

## 2010-08-14 ENCOUNTER — Encounter (HOSPITAL_COMMUNITY): Payer: Self-pay

## 2010-08-16 ENCOUNTER — Encounter (HOSPITAL_COMMUNITY): Payer: Self-pay

## 2010-08-19 ENCOUNTER — Encounter (HOSPITAL_COMMUNITY): Payer: Self-pay

## 2010-08-21 ENCOUNTER — Encounter (HOSPITAL_COMMUNITY): Payer: Self-pay

## 2010-08-23 ENCOUNTER — Encounter (HOSPITAL_COMMUNITY): Payer: Self-pay

## 2010-08-26 ENCOUNTER — Encounter (HOSPITAL_COMMUNITY): Payer: Self-pay

## 2010-08-27 ENCOUNTER — Other Ambulatory Visit: Payer: Self-pay | Admitting: Internal Medicine

## 2010-08-28 ENCOUNTER — Encounter (HOSPITAL_COMMUNITY): Payer: Self-pay

## 2010-08-30 ENCOUNTER — Encounter (HOSPITAL_COMMUNITY): Payer: Self-pay

## 2010-08-30 ENCOUNTER — Ambulatory Visit
Admission: RE | Admit: 2010-08-30 | Discharge: 2010-08-30 | Disposition: A | Discharge status: Home / Self Care | Payer: Medicare Other | Source: Ambulatory Visit | Attending: Internal Medicine | Admitting: Internal Medicine

## 2010-08-31 ENCOUNTER — Other Ambulatory Visit: Payer: Self-pay | Admitting: Cardiology

## 2010-08-31 DIAGNOSIS — E7800 Pure hypercholesterolemia, unspecified: Secondary | ICD-10-CM

## 2010-09-02 ENCOUNTER — Encounter (HOSPITAL_COMMUNITY): Payer: Self-pay

## 2010-09-02 ENCOUNTER — Other Ambulatory Visit: Payer: Self-pay | Admitting: Cardiology

## 2010-09-02 NOTE — Telephone Encounter (Signed)
Needs a refill of lipitor but would like to get the generic version of it. I can't find the file.

## 2010-09-02 NOTE — Telephone Encounter (Signed)
escribe medication per fax request  

## 2010-09-02 NOTE — Telephone Encounter (Signed)
Called requesting refill on gen Lipitor. LM that I had received refill request this AM and would refill generic.

## 2010-09-04 ENCOUNTER — Encounter (HOSPITAL_COMMUNITY): Payer: Self-pay

## 2010-09-06 ENCOUNTER — Encounter (HOSPITAL_COMMUNITY): Payer: Self-pay

## 2010-09-09 ENCOUNTER — Encounter (HOSPITAL_COMMUNITY): Payer: Self-pay

## 2010-09-11 ENCOUNTER — Encounter (HOSPITAL_COMMUNITY): Payer: Self-pay | Attending: Cardiology

## 2010-09-11 DIAGNOSIS — Z5189 Encounter for other specified aftercare: Secondary | ICD-10-CM | POA: Insufficient documentation

## 2010-09-11 DIAGNOSIS — R0609 Other forms of dyspnea: Secondary | ICD-10-CM | POA: Insufficient documentation

## 2010-09-11 DIAGNOSIS — Z87891 Personal history of nicotine dependence: Secondary | ICD-10-CM | POA: Insufficient documentation

## 2010-09-11 DIAGNOSIS — Z853 Personal history of malignant neoplasm of breast: Secondary | ICD-10-CM | POA: Insufficient documentation

## 2010-09-11 DIAGNOSIS — R0989 Other specified symptoms and signs involving the circulatory and respiratory systems: Secondary | ICD-10-CM | POA: Insufficient documentation

## 2010-09-11 DIAGNOSIS — J438 Other emphysema: Secondary | ICD-10-CM | POA: Insufficient documentation

## 2010-09-11 DIAGNOSIS — E785 Hyperlipidemia, unspecified: Secondary | ICD-10-CM | POA: Insufficient documentation

## 2010-09-11 DIAGNOSIS — I252 Old myocardial infarction: Secondary | ICD-10-CM | POA: Insufficient documentation

## 2010-09-11 DIAGNOSIS — J984 Other disorders of lung: Secondary | ICD-10-CM | POA: Insufficient documentation

## 2010-09-11 DIAGNOSIS — I10 Essential (primary) hypertension: Secondary | ICD-10-CM | POA: Insufficient documentation

## 2010-09-13 ENCOUNTER — Encounter (HOSPITAL_COMMUNITY): Payer: Self-pay

## 2010-09-16 ENCOUNTER — Encounter (HOSPITAL_COMMUNITY): Payer: Self-pay

## 2010-09-18 ENCOUNTER — Encounter (HOSPITAL_COMMUNITY): Payer: Self-pay

## 2010-09-20 ENCOUNTER — Encounter (HOSPITAL_COMMUNITY): Payer: Self-pay

## 2010-09-23 ENCOUNTER — Encounter (HOSPITAL_COMMUNITY): Payer: Self-pay

## 2010-09-24 NOTE — Assessment & Plan Note (Signed)
OFFICE VISIT   Melinda, Mora  DOB:  16-Nov-1933                                       07/31/2008  OQHUT#:[id number]   REASON FOR VISIT:  Evaluate leg pain.   HISTORY:  This is a 77 year old female that I am seeing for evaluation  of bilateral leg pain.  The patient states for the past several months  she has been increasing cramping in her legs with walking.  Her left leg  is more affected than her right and this occurs at approximately 1/4-  mile.  She denies having any rest pain.  She has no ulceration.  Pain is  relieved by rest and aggravated by walking, she is not taking pain  medicine for this.   The patient has a history of hypertension, hypercholesterolemia and  coronary artery disease.  She used to smoke but quit in 2008.   REVIEW OF SYSTEMS:  GENERAL:  Negative for fevers, chills, weight gain,  weight loss.  CARDIAC:  Positive for shortness of breath with exertion.  PULMONARY:  Negative.  GI:  Negative.  GU:  Negative.  VASCULAR:  Has pain in legs with walking.  NEURO:  Positive for dizziness.  ORTHO:  Positive for arthritis.  PSYCH:  Negative.  ENT:  Positive for recent change in eyesight.  HEME:  Negative.   PAST MEDICAL HISTORY:  Positive for hypertension, hypercholesterolemia,  history of MI, coronary artery disease, COPD.   PAST SURGICAL HISTORY:  Cardiac stent and breast biopsy.   FAMILY HISTORY:  Positive for cardiovascular disease at an early age in  her mother and father.   SOCIAL HISTORY:  She is widowed with 1 child.  She is retired.  She does  not smoke.  She has a history smoking but quit in 2008.  She does not  drink alcohol.   MEDICATIONS:  Centrum, Baby Aspirin, Diovan, Plavix, amlodipine,  metoprolol, Spiriva inhaler, Lipitor, mirtazapine.   ALLERGIES:  Penicillin and codeine.   PHYSICAL EXAMINATION:  Vital Signs:  Blood pressure 160/85, pulse 76.  General:  She is well-appearing, no distress.  HEENT:   Normocephalic,  atraumatic.  Pupils equal.  Cardiovascular:  Regular rate and rhythm,  respirations nonlabored.  Extremities:  Warm and well-perfused, there is  no ulceration, pedal pulses are not palpable.   DIAGNOSTIC STUDIES:  Vascular lab studies were performed today.  This  reveals an ankle brachial index of 0.86 on the right and 0.75 on the  left.   ASSESSMENT/PLAN:  Bilateral claudication.   Plan:  I do not feel that the patient's symptoms are lifestyle limiting  at this time and, therefore, I recommend continue with medical  management.  We discussed an exercise program.  We also need to make  sure that she has excellent blood pressure and cholesterol control.  I  have started her on cilostazol, although she is a little bit skeptical  of starting a new medicine.  I have encouraged her to continue walking  and to try cilostazol.  I have scheduled her to come back to see me in 6  months.  I do not think she is a candidate for intervention at this  time.   Jorge Ny, MD  Electronically Signed   VWB/MEDQ  D:  07/31/2008  T:  08/01/2008  Job:  [id number]   cc:   Jonny Ruiz  Bo Mcclintock, MD

## 2010-09-25 ENCOUNTER — Encounter (HOSPITAL_COMMUNITY): Payer: Self-pay

## 2010-09-27 ENCOUNTER — Encounter (HOSPITAL_COMMUNITY): Payer: Self-pay

## 2010-09-30 ENCOUNTER — Encounter (HOSPITAL_COMMUNITY): Payer: Self-pay

## 2010-10-02 ENCOUNTER — Encounter (HOSPITAL_COMMUNITY): Payer: Self-pay

## 2010-10-04 ENCOUNTER — Encounter (HOSPITAL_COMMUNITY): Payer: Self-pay

## 2010-10-07 ENCOUNTER — Encounter (HOSPITAL_COMMUNITY): Payer: Self-pay

## 2010-10-09 ENCOUNTER — Encounter (HOSPITAL_COMMUNITY): Payer: Self-pay

## 2010-10-11 ENCOUNTER — Encounter (HOSPITAL_COMMUNITY): Payer: Self-pay | Attending: Cardiology

## 2010-10-11 DIAGNOSIS — Z853 Personal history of malignant neoplasm of breast: Secondary | ICD-10-CM | POA: Insufficient documentation

## 2010-10-11 DIAGNOSIS — J984 Other disorders of lung: Secondary | ICD-10-CM | POA: Insufficient documentation

## 2010-10-11 DIAGNOSIS — Z5189 Encounter for other specified aftercare: Secondary | ICD-10-CM | POA: Insufficient documentation

## 2010-10-11 DIAGNOSIS — R0989 Other specified symptoms and signs involving the circulatory and respiratory systems: Secondary | ICD-10-CM | POA: Insufficient documentation

## 2010-10-11 DIAGNOSIS — I10 Essential (primary) hypertension: Secondary | ICD-10-CM | POA: Insufficient documentation

## 2010-10-11 DIAGNOSIS — Z87891 Personal history of nicotine dependence: Secondary | ICD-10-CM | POA: Insufficient documentation

## 2010-10-11 DIAGNOSIS — R0609 Other forms of dyspnea: Secondary | ICD-10-CM | POA: Insufficient documentation

## 2010-10-11 DIAGNOSIS — J438 Other emphysema: Secondary | ICD-10-CM | POA: Insufficient documentation

## 2010-10-11 DIAGNOSIS — E785 Hyperlipidemia, unspecified: Secondary | ICD-10-CM | POA: Insufficient documentation

## 2010-10-11 DIAGNOSIS — I252 Old myocardial infarction: Secondary | ICD-10-CM | POA: Insufficient documentation

## 2010-10-14 ENCOUNTER — Encounter (HOSPITAL_COMMUNITY): Payer: Self-pay

## 2010-10-15 ENCOUNTER — Encounter: Payer: Self-pay | Admitting: Gastroenterology

## 2010-10-15 ENCOUNTER — Ambulatory Visit (INDEPENDENT_AMBULATORY_CARE_PROVIDER_SITE_OTHER): Payer: Medicare Other | Admitting: Gastroenterology

## 2010-10-15 VITALS — BP 122/68 | HR 80 | Ht 59.0 in | Wt 152.0 lb

## 2010-10-15 DIAGNOSIS — R197 Diarrhea, unspecified: Secondary | ICD-10-CM

## 2010-10-15 DIAGNOSIS — Z86010 Personal history of colonic polyps: Secondary | ICD-10-CM

## 2010-10-15 MED ORDER — PEG-KCL-NACL-NASULF-NA ASC-C 100 G PO SOLR: 1.0000 | Freq: Once | ORAL | Status: DC

## 2010-10-15 NOTE — Progress Notes (Signed)
History of Present Illness: This is a 77 year old female here today with her daughter. She relates 3 separate episodes of urgent, watery, nonbloody diarrhea that have occurred over the past few months. In between this she is having normal to mildly constipated bowel movements which is her typical bowel pattern. She cannot relate any particular food or activity occurring around the time of her urgent bowel movements. This did lead to incontinence on one occasion. In addition she has a history of adenomatous colon polyps with last colonoscopy in November 2005 showing an adenomatous polyp in the cecum. She denies change in stool caliber, melena, hematochezia, weight loss, nausea, vomiting. She is maintained on Plavix, Pletal and ASA for coronary artery disease. She has COPD, hyperlipidemia, hypertension.  Past Medical History  Diagnosis Date  . COPD (chronic obstructive pulmonary disease)   . Hyperlipidemia   . Hypertension   . Depression   . Osteoarthritis   . Allergic rhinitis   . Breast cancer     LT1bN0  . Lung nodule   . Raynaud disease   . Adenomatous polyp 03/2004  . Heart attack   . Diverticulosis    Past Surgical History  Procedure Date  . Breast lumpectomy   . Cataract extraction   . Tympanostomy tube placement   . Angioplasty 9/09, 12/10    reports that she quit smoking about 3 years ago. She does not have any smokeless tobacco history on file. She reports that she does not drink alcohol or use illicit drugs. family history includes Breast cancer in an unspecified family member; Coronary artery disease in her father; Gout in her father; Heart attack in her father; Hypertension in her sister; Osteoarthritis in her mother; and Uterine cancer in her sister.  There is no history of Colon cancer. Allergies  Allergen Reactions  . Codeine     REACTION: numbing  . Penicillins     REACTION: hives  . Shellfish Allergy    Outpatient Encounter Prescriptions as of 10/15/2010  Medication  Sig Dispense Refill  . alendronate (FOSAMAX) 70 MG tablet Take 70 mg by mouth every 7 (seven) days. Take with a full glass of water on an empty stomach.       . ALPRAZolam (XANAX) 0.25 MG tablet Take 0.25 mg by mouth 2 (two) times daily as needed.        Marland Kitchen amLODipine (NORVASC) 5 MG tablet Take 5 mg by mouth daily.        Marland Kitchen aspirin 325 MG tablet Take 325 mg by mouth daily.        Marland Kitchen atorvastatin (LIPITOR) 40 MG tablet TAKE 1 TABLET DAILY  90 tablet  3  . Calcium Lactate 750 MG TABS Take 1 tablet by mouth daily.        . cilostazol (PLETAL) 50 MG tablet Take 50 mg by mouth 2 (two) times daily.        . clopidogrel (PLAVIX) 75 MG tablet Take 75 mg by mouth daily.        . cycloSPORINE (RESTASIS) 0.05 % ophthalmic emulsion 1 drop 2 (two) times daily.        . fish oil-omega-3 fatty acids 1000 MG capsule Take 2 g by mouth daily.        . metoprolol (LOPRESSOR) 50 MG tablet Take 50 mg by mouth 2 (two) times daily.        . mirtazapine (REMERON) 30 MG tablet Take 30 mg by mouth at bedtime.        Marland Kitchen  Multiple Vitamin (MULTIVITAMIN) tablet Take 1 tablet by mouth daily.        . nitroGLYCERIN (NITROSTAT) 0.4 MG SL tablet Place 0.4 mg under the tongue every 5 (five) minutes as needed.        . tiotropium (SPIRIVA) 18 MCG inhalation capsule Place 18 mcg into inhaler and inhale daily.        . valsartan-hydrochlorothiazide (DIOVAN-HCT) 160-12.5 MG per tablet Take 1 tablet by mouth daily.        Marland Kitchen albuterol (VENTOLIN HFA) 108 (90 BASE) MCG/ACT inhaler Inhale 2 puffs into the lungs every 6 (six) hours as needed.        . peg 3350 powder (MOVIPREP) 100 G SOLR Take 1 kit (100 g total) by mouth once.  1 kit  0    Review of Systems: Pertinent positive and negative review of systems were noted in the above HPI section. All other review of systems were otherwise negative.  Physical Exam: General: Well developed , well nourished, no acute distress Head: Normocephalic and atraumatic Eyes:  sclerae anicteric,  EOMI Ears: Normal auditory acuity Mouth: No deformity or lesions Neck: Supple, no masses or thyromegaly Lungs: Clear throughout to auscultation Heart: Regular rate and rhythm; no murmurs, rubs or bruits Abdomen: Soft, non tender and non distended. No masses, hepatosplenomegaly or hernias noted. Normal Bowel sounds Rectal: Deferred to colonoscopy Musculoskeletal: Symmetrical with no gross deformities  Skin: No lesions on visible extremities Pulses:  Normal pulses noted Extremities: No clubbing, cyanosis, edema or deformities noted Neurological: Alert oriented x 4, grossly nonfocal Cervical Nodes:  No significant cervical adenopathy Inguinal Nodes: No significant inguinal adenopathy Psychological:  Alert and cooperative. Normal mood and affect  Assessment and Recommendations:  1. Episodic diarrhea over the past few months normal bowel habits in between. She may have had a self limited problem such as an infection or food intolerance. It is possible that she has an underlying gastrointestinal disorder leading to episodic diarrhea but this seems less likely. Rule out inflammatory bowel disease and colorectal neoplasms with colonoscopy. The risks, benefits, and alternatives to colonoscopy with possible biopsy and possible polypectomy were discussed with the patient and they consent to proceed.   2. Personal history of his colon polyps. Colonoscopy as above.  3. Coronary artery disease. She is asked to hold her Pletal and Plavix for at least 5-7 days prior to her colonoscopy. Obtain clearance from her prescribing physicians.

## 2010-10-15 NOTE — Patient Instructions (Signed)
You have been scheduled for a Colonoscopy. Separate instructions given to patient. Pick up your prep from your pharmacy.  We will contact regarding your Plavix and Pletal. cc: [name], MD

## 2010-10-16 ENCOUNTER — Encounter (HOSPITAL_COMMUNITY): Payer: Self-pay

## 2010-10-17 ENCOUNTER — Telehealth: Payer: Self-pay

## 2010-10-17 NOTE — Telephone Encounter (Signed)
Spoke with patient and informed her that Dr. Timothy Lasso informed us that patient can come off Plavix and Pletal 5 days prior to her procedure. Pt agreed and verbalized understanding.

## 2010-10-18 ENCOUNTER — Encounter (HOSPITAL_COMMUNITY): Payer: Self-pay

## 2010-10-21 ENCOUNTER — Encounter (HOSPITAL_COMMUNITY): Payer: Self-pay

## 2010-10-23 ENCOUNTER — Encounter (HOSPITAL_COMMUNITY): Payer: Self-pay

## 2010-10-25 ENCOUNTER — Encounter (HOSPITAL_COMMUNITY): Payer: Self-pay

## 2010-10-28 ENCOUNTER — Encounter (HOSPITAL_COMMUNITY): Payer: Self-pay

## 2010-10-30 ENCOUNTER — Encounter (HOSPITAL_COMMUNITY): Payer: Self-pay

## 2010-11-01 ENCOUNTER — Encounter (HOSPITAL_COMMUNITY): Payer: Self-pay

## 2010-11-04 ENCOUNTER — Encounter (HOSPITAL_COMMUNITY): Payer: Self-pay

## 2010-11-06 ENCOUNTER — Encounter (HOSPITAL_COMMUNITY): Payer: Self-pay

## 2010-11-08 ENCOUNTER — Encounter (HOSPITAL_COMMUNITY): Payer: Self-pay

## 2010-11-11 ENCOUNTER — Encounter (HOSPITAL_COMMUNITY): Payer: Self-pay | Attending: Cardiology

## 2010-11-11 DIAGNOSIS — J438 Other emphysema: Secondary | ICD-10-CM | POA: Insufficient documentation

## 2010-11-11 DIAGNOSIS — Z853 Personal history of malignant neoplasm of breast: Secondary | ICD-10-CM | POA: Insufficient documentation

## 2010-11-11 DIAGNOSIS — Z87891 Personal history of nicotine dependence: Secondary | ICD-10-CM | POA: Insufficient documentation

## 2010-11-11 DIAGNOSIS — R0609 Other forms of dyspnea: Secondary | ICD-10-CM | POA: Insufficient documentation

## 2010-11-11 DIAGNOSIS — E785 Hyperlipidemia, unspecified: Secondary | ICD-10-CM | POA: Insufficient documentation

## 2010-11-11 DIAGNOSIS — R0989 Other specified symptoms and signs involving the circulatory and respiratory systems: Secondary | ICD-10-CM | POA: Insufficient documentation

## 2010-11-11 DIAGNOSIS — J984 Other disorders of lung: Secondary | ICD-10-CM | POA: Insufficient documentation

## 2010-11-11 DIAGNOSIS — Z5189 Encounter for other specified aftercare: Secondary | ICD-10-CM | POA: Insufficient documentation

## 2010-11-11 DIAGNOSIS — I252 Old myocardial infarction: Secondary | ICD-10-CM | POA: Insufficient documentation

## 2010-11-11 DIAGNOSIS — I10 Essential (primary) hypertension: Secondary | ICD-10-CM | POA: Insufficient documentation

## 2010-11-13 ENCOUNTER — Encounter (HOSPITAL_COMMUNITY): Payer: Self-pay

## 2010-11-15 ENCOUNTER — Encounter (HOSPITAL_COMMUNITY): Payer: Self-pay

## 2010-11-18 ENCOUNTER — Encounter (HOSPITAL_COMMUNITY): Payer: Self-pay

## 2010-11-20 ENCOUNTER — Encounter (HOSPITAL_COMMUNITY): Payer: Self-pay

## 2010-11-21 ENCOUNTER — Telehealth: Payer: Self-pay | Admitting: Gastroenterology

## 2010-11-21 NOTE — Telephone Encounter (Signed)
Instructed pt to drink the second half of her prep tomorrow as the instructions tell her to do, 5 hours before procedure. Pt daughter verbalized understanding. Pt daughter also expressed concern about her mothers electrolytes becoming "out of whack." Instructed daughter to monitor pt closely and call MD on call if she feels there is a need to.

## 2010-11-22 ENCOUNTER — Encounter (HOSPITAL_COMMUNITY): Payer: Self-pay

## 2010-11-22 ENCOUNTER — Encounter: Payer: Self-pay | Admitting: Gastroenterology

## 2010-11-22 ENCOUNTER — Ambulatory Visit (AMBULATORY_SURGERY_CENTER): Payer: Medicare Other | Admitting: Gastroenterology

## 2010-11-22 DIAGNOSIS — R197 Diarrhea, unspecified: Secondary | ICD-10-CM

## 2010-11-22 DIAGNOSIS — Z860100 Personal history of colon polyps, unspecified: Secondary | ICD-10-CM

## 2010-11-22 DIAGNOSIS — D126 Benign neoplasm of colon, unspecified: Secondary | ICD-10-CM

## 2010-11-22 DIAGNOSIS — K621 Rectal polyp: Secondary | ICD-10-CM

## 2010-11-22 DIAGNOSIS — K620 Anal polyp: Secondary | ICD-10-CM

## 2010-11-22 MED ORDER — SODIUM CHLORIDE 0.9 % IV SOLN: 500.0000 mL | INTRAVENOUS | Status: DC

## 2010-11-22 NOTE — Progress Notes (Signed)
PT TOOK HER PLAVIX THIS AM- HAS NOT HELD PLAVIX  PT REQUESTS THAT A COLON BX BE DONE DUE TO HER DIARRHEA

## 2010-11-22 NOTE — Patient Instructions (Signed)
Please review discharge instructions  Continue all medications  Review information on polyps, diverticulosis and high fiber diets  Push fluids

## 2010-11-25 ENCOUNTER — Telehealth: Payer: Self-pay

## 2010-11-25 ENCOUNTER — Encounter (HOSPITAL_COMMUNITY): Payer: Self-pay

## 2010-11-25 NOTE — Telephone Encounter (Signed)

## 2010-11-27 ENCOUNTER — Encounter (HOSPITAL_COMMUNITY): Payer: Self-pay

## 2010-11-27 ENCOUNTER — Encounter: Payer: Self-pay | Admitting: Gastroenterology

## 2010-11-29 ENCOUNTER — Encounter (HOSPITAL_COMMUNITY): Payer: Self-pay

## 2010-12-02 ENCOUNTER — Encounter (HOSPITAL_COMMUNITY): Payer: Self-pay

## 2010-12-04 ENCOUNTER — Encounter (HOSPITAL_COMMUNITY): Payer: Self-pay

## 2010-12-06 ENCOUNTER — Encounter (HOSPITAL_COMMUNITY): Payer: Self-pay

## 2010-12-09 ENCOUNTER — Encounter (HOSPITAL_COMMUNITY): Payer: Self-pay

## 2010-12-11 ENCOUNTER — Encounter (HOSPITAL_COMMUNITY): Payer: Self-pay | Attending: Cardiology

## 2010-12-11 DIAGNOSIS — J438 Other emphysema: Secondary | ICD-10-CM | POA: Insufficient documentation

## 2010-12-11 DIAGNOSIS — E785 Hyperlipidemia, unspecified: Secondary | ICD-10-CM | POA: Insufficient documentation

## 2010-12-11 DIAGNOSIS — Z5189 Encounter for other specified aftercare: Secondary | ICD-10-CM | POA: Insufficient documentation

## 2010-12-11 DIAGNOSIS — Z853 Personal history of malignant neoplasm of breast: Secondary | ICD-10-CM | POA: Insufficient documentation

## 2010-12-11 DIAGNOSIS — R0609 Other forms of dyspnea: Secondary | ICD-10-CM | POA: Insufficient documentation

## 2010-12-11 DIAGNOSIS — R0989 Other specified symptoms and signs involving the circulatory and respiratory systems: Secondary | ICD-10-CM | POA: Insufficient documentation

## 2010-12-11 DIAGNOSIS — I10 Essential (primary) hypertension: Secondary | ICD-10-CM | POA: Insufficient documentation

## 2010-12-11 DIAGNOSIS — J984 Other disorders of lung: Secondary | ICD-10-CM | POA: Insufficient documentation

## 2010-12-11 DIAGNOSIS — I252 Old myocardial infarction: Secondary | ICD-10-CM | POA: Insufficient documentation

## 2010-12-11 DIAGNOSIS — Z87891 Personal history of nicotine dependence: Secondary | ICD-10-CM | POA: Insufficient documentation

## 2010-12-13 ENCOUNTER — Encounter (HOSPITAL_COMMUNITY): Payer: Self-pay

## 2010-12-16 ENCOUNTER — Encounter (HOSPITAL_COMMUNITY): Payer: Self-pay

## 2010-12-18 ENCOUNTER — Encounter (HOSPITAL_COMMUNITY): Payer: Self-pay

## 2010-12-20 ENCOUNTER — Encounter (HOSPITAL_COMMUNITY): Payer: Self-pay

## 2010-12-23 ENCOUNTER — Encounter (HOSPITAL_COMMUNITY): Payer: Self-pay

## 2010-12-25 ENCOUNTER — Encounter (HOSPITAL_COMMUNITY): Payer: Self-pay

## 2010-12-27 ENCOUNTER — Encounter (HOSPITAL_COMMUNITY): Payer: Self-pay

## 2010-12-30 ENCOUNTER — Encounter (HOSPITAL_COMMUNITY): Payer: Self-pay

## 2011-01-01 ENCOUNTER — Encounter (HOSPITAL_COMMUNITY): Payer: Self-pay

## 2011-01-03 ENCOUNTER — Encounter (HOSPITAL_COMMUNITY): Payer: Self-pay

## 2011-01-06 ENCOUNTER — Encounter (HOSPITAL_COMMUNITY): Payer: Self-pay

## 2011-01-08 ENCOUNTER — Encounter (HOSPITAL_COMMUNITY): Payer: Self-pay

## 2011-01-10 ENCOUNTER — Encounter (HOSPITAL_COMMUNITY): Payer: Self-pay

## 2011-01-13 ENCOUNTER — Encounter (HOSPITAL_COMMUNITY): Payer: Self-pay | Attending: Cardiology

## 2011-01-13 DIAGNOSIS — J438 Other emphysema: Secondary | ICD-10-CM | POA: Insufficient documentation

## 2011-01-13 DIAGNOSIS — Z853 Personal history of malignant neoplasm of breast: Secondary | ICD-10-CM | POA: Insufficient documentation

## 2011-01-13 DIAGNOSIS — R0989 Other specified symptoms and signs involving the circulatory and respiratory systems: Secondary | ICD-10-CM | POA: Insufficient documentation

## 2011-01-13 DIAGNOSIS — J984 Other disorders of lung: Secondary | ICD-10-CM | POA: Insufficient documentation

## 2011-01-13 DIAGNOSIS — R0609 Other forms of dyspnea: Secondary | ICD-10-CM | POA: Insufficient documentation

## 2011-01-13 DIAGNOSIS — I252 Old myocardial infarction: Secondary | ICD-10-CM | POA: Insufficient documentation

## 2011-01-13 DIAGNOSIS — I10 Essential (primary) hypertension: Secondary | ICD-10-CM | POA: Insufficient documentation

## 2011-01-13 DIAGNOSIS — E785 Hyperlipidemia, unspecified: Secondary | ICD-10-CM | POA: Insufficient documentation

## 2011-01-13 DIAGNOSIS — Z87891 Personal history of nicotine dependence: Secondary | ICD-10-CM | POA: Insufficient documentation

## 2011-01-13 DIAGNOSIS — Z5189 Encounter for other specified aftercare: Secondary | ICD-10-CM | POA: Insufficient documentation

## 2011-01-14 ENCOUNTER — Ambulatory Visit (INDEPENDENT_AMBULATORY_CARE_PROVIDER_SITE_OTHER): Payer: Medicare Other | Admitting: Cardiology

## 2011-01-14 ENCOUNTER — Other Ambulatory Visit: Payer: Self-pay | Admitting: Cardiology

## 2011-01-14 ENCOUNTER — Encounter: Payer: Self-pay | Admitting: Cardiology

## 2011-01-14 DIAGNOSIS — I219 Acute myocardial infarction, unspecified: Secondary | ICD-10-CM

## 2011-01-14 DIAGNOSIS — J449 Chronic obstructive pulmonary disease, unspecified: Secondary | ICD-10-CM

## 2011-01-14 DIAGNOSIS — E785 Hyperlipidemia, unspecified: Secondary | ICD-10-CM

## 2011-01-14 DIAGNOSIS — I2510 Atherosclerotic heart disease of native coronary artery without angina pectoris: Secondary | ICD-10-CM

## 2011-01-14 DIAGNOSIS — I10 Essential (primary) hypertension: Secondary | ICD-10-CM

## 2011-01-14 NOTE — Patient Instructions (Signed)
Reduce ASA to 81 mg daily.  Have Dr. Timothy Lasso send me a copy of your lab work.  I will see you again in 6 months.

## 2011-01-14 NOTE — Assessment & Plan Note (Signed)
I have congratulated her on her continued smoking cessation.

## 2011-01-14 NOTE — Progress Notes (Signed)
Melinda Mora Date of Birth: [birth year]   History of Present Illness: Melinda Mora is seen today for followup. She reports that she is doing well. She is still short of breath at times. She is no longer smoking. She denies any chest pain. She continues to participate in cardiac rehabilitation. She does report that she fatigues easily but this is unchanged.  Current Outpatient Prescriptions on File Prior to Visit  Medication Sig Dispense Refill  . albuterol (VENTOLIN HFA) 108 (90 BASE) MCG/ACT inhaler Inhale 2 puffs into the lungs every 6 (six) hours as needed.        Marland Kitchen alendronate (FOSAMAX) 70 MG tablet Take 70 mg by mouth every 7 (seven) days. Take with a full glass of water on an empty stomach.       . ALPRAZolam (XANAX) 0.25 MG tablet Take 0.25 mg by mouth 2 (two) times daily as needed.        Marland Kitchen amLODipine (NORVASC) 5 MG tablet Take 5 mg by mouth daily.        Marland Kitchen aspirin 325 MG tablet Take 325 mg by mouth daily.        Marland Kitchen atorvastatin (LIPITOR) 40 MG tablet TAKE 1 TABLET DAILY  90 tablet  3  . Calcium Lactate 750 MG TABS Take 1 tablet by mouth daily.        . cilostazol (PLETAL) 50 MG tablet Take 50 mg by mouth 2 (two) times daily.       . clopidogrel (PLAVIX) 75 MG tablet Take 75 mg by mouth daily.        . cycloSPORINE (RESTASIS) 0.05 % ophthalmic emulsion 1 drop 2 (two) times daily.        . fish oil-omega-3 fatty acids 1000 MG capsule Take 2 g by mouth daily. Taking Mega Red      . metoprolol (LOPRESSOR) 50 MG tablet Take 50 mg by mouth 2 (two) times daily.        . mirtazapine (REMERON) 30 MG tablet Take 30 mg by mouth at bedtime.        . Multiple Vitamin (MULTIVITAMIN) tablet Take 1 tablet by mouth daily.        . nitroGLYCERIN (NITROSTAT) 0.4 MG SL tablet Place 0.4 mg under the tongue every 5 (five) minutes as needed.        . tiotropium (SPIRIVA) 18 MCG inhalation capsule Place 18 mcg into inhaler and inhale daily.        . valsartan-hydrochlorothiazide (DIOVAN-HCT) 160-12.5 MG  per tablet Take 1 tablet by mouth daily.        Marland Kitchen ZYMAXID 0.5 % SOLN       . peg 3350 powder (MOVIPREP) 100 G SOLR Take 1 kit (100 g total) by mouth once.  1 kit  0   Current Facility-Administered Medications on File Prior to Visit  Medication Dose Route Frequency Provider Last Rate Last Dose  . 0.9 %  sodium chloride infusion  500 mL Intravenous Continuous Meryl Dare, MD,FACG        Allergies  Allergen Reactions  . Codeine     REACTION: numbing  . Penicillins     REACTION: hives  . Shellfish Allergy     Past Medical History  Diagnosis Date  . COPD (chronic obstructive pulmonary disease)   . Hyperlipidemia   . Hypertension   . Depression   . Osteoarthritis   . Allergic rhinitis   . Breast cancer     LT1bN0  . Lung  nodule   . Raynaud disease   . Adenomatous polyp 03/2004  . Heart attack   . Diverticulosis   . CAD (coronary artery disease)     Past Surgical History  Procedure Date  . Breast lumpectomy   . Cataract extraction   . Tympanostomy tube placement   . Angioplasty 9/09, 12/10  . Colonoscopy     History  Smoking status  . Former Smoker  . Quit date: 01/11/2007  Smokeless tobacco  . Not on file    History  Alcohol Use No    Family History  Problem Relation Age of Onset  . Coronary artery disease Father     & Mother  . Heart attack Father   . Gout Father   . Osteoarthritis Mother   . Heart disease Mother   . Hypertension Sister   . Heart disease Sister   . Breast cancer      Aunts  . Uterine cancer Sister   . Colon cancer Neg Hx     Review of Systems: The review of systems is positive for a colonoscopy which demonstrated benign polyps and diverticuli.  All other systems were reviewed and are negative.  Physical Exam: BP 140/78  Pulse 64  Ht 4' 8.5" (1.435 m)  Wt 150 lb 12.8 oz (68.402 kg)  BMI 33.21 kg/m2 She is a chronically ill-appearing white female in no acute distress. Her HEENT exam is unremarkable. She is normocephalic,  atraumatic. Pupils are equal round and reactive to light and accommodation. Oropharynx is clear. Neck is supple without JVD, adenopathy, thyromegaly, or bruits. Lungs reveal dry crackles bilaterally. Cardiac exam reveals a regular rate and rhythm without gallop, murmur, or click. Abdomen is soft and nontender. She has no masses or bruits. Extremities are without edema. She does have palpable pedal pulses. She is alert and oriented x3. Cranial nerves II through XII are intact. LABORATORY DATA:   Assessment / Plan:

## 2011-01-14 NOTE — Assessment & Plan Note (Signed)
She is having no significant anginal symptoms. She will continue on her current medical therapy.

## 2011-01-14 NOTE — Assessment & Plan Note (Signed)
A pressure is well-controlled on her current therapy.

## 2011-01-15 ENCOUNTER — Encounter (HOSPITAL_COMMUNITY): Payer: Self-pay

## 2011-01-15 NOTE — Telephone Encounter (Signed)
escribe medication per fax request  

## 2011-01-17 ENCOUNTER — Encounter (HOSPITAL_COMMUNITY): Payer: Self-pay

## 2011-01-20 ENCOUNTER — Encounter (HOSPITAL_COMMUNITY): Payer: Self-pay

## 2011-01-22 ENCOUNTER — Encounter (HOSPITAL_COMMUNITY): Payer: Self-pay

## 2011-01-24 ENCOUNTER — Encounter (HOSPITAL_COMMUNITY): Payer: Self-pay

## 2011-01-27 ENCOUNTER — Encounter (HOSPITAL_COMMUNITY): Payer: Self-pay

## 2011-01-29 ENCOUNTER — Encounter (HOSPITAL_COMMUNITY): Payer: Self-pay

## 2011-01-31 ENCOUNTER — Encounter (HOSPITAL_COMMUNITY): Payer: Self-pay

## 2011-02-03 ENCOUNTER — Encounter (HOSPITAL_COMMUNITY): Payer: Self-pay

## 2011-02-05 ENCOUNTER — Encounter (HOSPITAL_COMMUNITY): Payer: Self-pay

## 2011-02-07 ENCOUNTER — Encounter (HOSPITAL_COMMUNITY): Payer: Self-pay

## 2011-02-10 ENCOUNTER — Encounter (HOSPITAL_COMMUNITY): Payer: Self-pay | Attending: Cardiology

## 2011-02-10 DIAGNOSIS — E785 Hyperlipidemia, unspecified: Secondary | ICD-10-CM | POA: Insufficient documentation

## 2011-02-10 DIAGNOSIS — Z87891 Personal history of nicotine dependence: Secondary | ICD-10-CM | POA: Insufficient documentation

## 2011-02-10 DIAGNOSIS — R0989 Other specified symptoms and signs involving the circulatory and respiratory systems: Secondary | ICD-10-CM | POA: Insufficient documentation

## 2011-02-10 DIAGNOSIS — I10 Essential (primary) hypertension: Secondary | ICD-10-CM | POA: Insufficient documentation

## 2011-02-10 DIAGNOSIS — Z5189 Encounter for other specified aftercare: Secondary | ICD-10-CM | POA: Insufficient documentation

## 2011-02-10 DIAGNOSIS — J438 Other emphysema: Secondary | ICD-10-CM | POA: Insufficient documentation

## 2011-02-10 DIAGNOSIS — J984 Other disorders of lung: Secondary | ICD-10-CM | POA: Insufficient documentation

## 2011-02-10 DIAGNOSIS — Z853 Personal history of malignant neoplasm of breast: Secondary | ICD-10-CM | POA: Insufficient documentation

## 2011-02-10 DIAGNOSIS — I252 Old myocardial infarction: Secondary | ICD-10-CM | POA: Insufficient documentation

## 2011-02-10 DIAGNOSIS — R0609 Other forms of dyspnea: Secondary | ICD-10-CM | POA: Insufficient documentation

## 2011-02-12 ENCOUNTER — Encounter (HOSPITAL_COMMUNITY): Payer: Self-pay

## 2011-02-14 ENCOUNTER — Encounter (HOSPITAL_COMMUNITY): Payer: Self-pay

## 2011-02-17 ENCOUNTER — Encounter (HOSPITAL_COMMUNITY): Payer: Self-pay

## 2011-02-19 ENCOUNTER — Encounter (HOSPITAL_COMMUNITY): Payer: Self-pay

## 2011-02-21 ENCOUNTER — Encounter (HOSPITAL_COMMUNITY): Payer: Self-pay

## 2011-02-24 ENCOUNTER — Encounter (HOSPITAL_COMMUNITY): Payer: Self-pay

## 2011-02-26 ENCOUNTER — Encounter (HOSPITAL_COMMUNITY): Payer: Self-pay

## 2011-02-28 ENCOUNTER — Encounter (HOSPITAL_COMMUNITY): Payer: Self-pay

## 2011-03-03 ENCOUNTER — Encounter (HOSPITAL_COMMUNITY): Payer: Self-pay

## 2011-03-05 ENCOUNTER — Encounter (HOSPITAL_COMMUNITY): Payer: Self-pay

## 2011-03-07 ENCOUNTER — Encounter (HOSPITAL_COMMUNITY): Payer: Self-pay

## 2011-03-10 ENCOUNTER — Encounter (HOSPITAL_COMMUNITY): Payer: Self-pay

## 2011-03-12 ENCOUNTER — Encounter (HOSPITAL_COMMUNITY): Payer: Self-pay

## 2011-03-14 ENCOUNTER — Encounter (HOSPITAL_COMMUNITY): Payer: Self-pay

## 2011-03-14 DIAGNOSIS — I10 Essential (primary) hypertension: Secondary | ICD-10-CM | POA: Insufficient documentation

## 2011-03-14 DIAGNOSIS — J984 Other disorders of lung: Secondary | ICD-10-CM | POA: Insufficient documentation

## 2011-03-14 DIAGNOSIS — Z5189 Encounter for other specified aftercare: Secondary | ICD-10-CM | POA: Insufficient documentation

## 2011-03-14 DIAGNOSIS — R0609 Other forms of dyspnea: Secondary | ICD-10-CM | POA: Insufficient documentation

## 2011-03-14 DIAGNOSIS — I252 Old myocardial infarction: Secondary | ICD-10-CM | POA: Insufficient documentation

## 2011-03-14 DIAGNOSIS — Z87891 Personal history of nicotine dependence: Secondary | ICD-10-CM | POA: Insufficient documentation

## 2011-03-14 DIAGNOSIS — J438 Other emphysema: Secondary | ICD-10-CM | POA: Insufficient documentation

## 2011-03-14 DIAGNOSIS — E785 Hyperlipidemia, unspecified: Secondary | ICD-10-CM | POA: Insufficient documentation

## 2011-03-14 DIAGNOSIS — Z853 Personal history of malignant neoplasm of breast: Secondary | ICD-10-CM | POA: Insufficient documentation

## 2011-03-14 DIAGNOSIS — R0989 Other specified symptoms and signs involving the circulatory and respiratory systems: Secondary | ICD-10-CM | POA: Insufficient documentation

## 2011-03-17 ENCOUNTER — Encounter (HOSPITAL_COMMUNITY): Payer: Self-pay

## 2011-03-18 ENCOUNTER — Other Ambulatory Visit: Payer: Self-pay | Admitting: Oncology

## 2011-03-18 ENCOUNTER — Other Ambulatory Visit (HOSPITAL_BASED_OUTPATIENT_CLINIC_OR_DEPARTMENT_OTHER): Payer: Medicare Other | Admitting: Lab

## 2011-03-18 DIAGNOSIS — C50119 Malignant neoplasm of central portion of unspecified female breast: Secondary | ICD-10-CM

## 2011-03-18 LAB — CBC WITH DIFFERENTIAL/PLATELET
BASO%: 0.7 % (ref 0.0–2.0)
Basophils Absolute: 0.1 10*3/uL (ref 0.0–0.1)
EOS%: 1.6 % (ref 0.0–7.0)
HCT: 43.8 % (ref 34.8–46.6)
HGB: 14.8 g/dL (ref 11.6–15.9)
LYMPH%: 23.1 % (ref 14.0–49.7)
MCH: 30.9 pg (ref 25.1–34.0)
MCHC: 33.7 g/dL (ref 31.5–36.0)
MCV: 91.7 fL (ref 79.5–101.0)
NEUT%: 68 % (ref 38.4–76.8)
Platelets: 268 10*3/uL (ref 145–400)
lymph#: 1.9 10*3/uL (ref 0.9–3.3)

## 2011-03-18 LAB — COMPREHENSIVE METABOLIC PANEL
ALT: 20 U/L (ref 0–35)
AST: 20 U/L (ref 0–37)
Albumin: 4 g/dL (ref 3.5–5.2)
Calcium: 9.7 mg/dL (ref 8.4–10.5)
Chloride: 105 mEq/L (ref 96–112)
Potassium: 3.9 mEq/L (ref 3.5–5.3)
Sodium: 141 mEq/L (ref 135–145)

## 2011-03-19 ENCOUNTER — Encounter (HOSPITAL_COMMUNITY): Payer: Self-pay

## 2011-03-21 ENCOUNTER — Encounter (HOSPITAL_COMMUNITY): Payer: Self-pay

## 2011-03-24 ENCOUNTER — Encounter (HOSPITAL_COMMUNITY): Payer: Self-pay

## 2011-03-25 ENCOUNTER — Telehealth: Payer: Self-pay | Admitting: Oncology

## 2011-03-25 ENCOUNTER — Ambulatory Visit (HOSPITAL_BASED_OUTPATIENT_CLINIC_OR_DEPARTMENT_OTHER): Payer: Medicare Other | Admitting: Oncology

## 2011-03-25 VITALS — BP 155/81 | HR 97 | Temp 97.3°F | Ht 59.0 in | Wt 154.2 lb

## 2011-03-25 DIAGNOSIS — C50119 Malignant neoplasm of central portion of unspecified female breast: Secondary | ICD-10-CM

## 2011-03-25 DIAGNOSIS — C50919 Malignant neoplasm of unspecified site of unspecified female breast: Secondary | ICD-10-CM

## 2011-03-25 NOTE — Telephone Encounter (Signed)
gv pt appt for [id number] °

## 2011-03-25 NOTE — Progress Notes (Signed)
ID: Melinda Mora   Interval History:  Cecillia returns today for follow and does some other heavy work. She does shopping cooking and light cleaning as well as some laundry. She is not otherwise exercising regularly. up of her breast cancer the interval history generally is unremarkable she continues to go to rehabilitation she is reasonably active for her age there is "a diet" to who comes daily to do vacuuming and also at changes the sheet on the beds  ROS:  A detailed review of systems was otherwise not contributory she denies unusual headaches visual changes cough phlegm production pleurisy shortness of breath change in bowel or bladder habits problems with bleeding rash fever or worsening night sweats.  Medications: I have reviewed the patient's current medications.   Current Outpatient Prescriptions  Medication Sig Dispense Refill  . albuterol (VENTOLIN HFA) 108 (90 BASE) MCG/ACT inhaler Inhale 2 puffs into the lungs every 6 (six) hours as needed.        Marland Kitchen alendronate (FOSAMAX) 70 MG tablet Take 70 mg by mouth every 7 (seven) days. Take with a full glass of water on an empty stomach.       . ALPRAZolam (XANAX) 0.25 MG tablet Take 0.25 mg by mouth 2 (two) times daily as needed.        Marland Kitchen amLODipine (NORVASC) 5 MG tablet TAKE 1 TABLET DAILY  90 tablet  3  . aspirin 325 MG tablet Take 325 mg by mouth daily.        Marland Kitchen atorvastatin (LIPITOR) 40 MG tablet TAKE 1 TABLET DAILY  90 tablet  3  . Calcium Lactate 750 MG TABS Take 1 tablet by mouth daily.        . cilostazol (PLETAL) 50 MG tablet Take 50 mg by mouth 2 (two) times daily.       . clopidogrel (PLAVIX) 75 MG tablet Take 75 mg by mouth daily.        . cycloSPORINE (RESTASIS) 0.05 % ophthalmic emulsion 1 drop 2 (two) times daily.        . fish oil-omega-3 fatty acids 1000 MG capsule Take 2 g by mouth daily. Taking Mega Red      . memantine (NAMENDA) 5 MG tablet Take 5 mg by mouth daily.        . metoprolol (LOPRESSOR) 50 MG tablet Take 50 mg  by mouth 2 (two) times daily.        . mirtazapine (REMERON) 30 MG tablet Take 30 mg by mouth at bedtime.        . Multiple Vitamin (MULTIVITAMIN) tablet Take 1 tablet by mouth daily.        . nitroGLYCERIN (NITROSTAT) 0.4 MG SL tablet Place 0.4 mg under the tongue every 5 (five) minutes as needed.        . peg 3350 powder (MOVIPREP) 100 G SOLR Take 1 kit (100 g total) by mouth once.  1 kit  0  . tiotropium (SPIRIVA) 18 MCG inhalation capsule Place 18 mcg into inhaler and inhale daily.        . valsartan-hydrochlorothiazide (DIOVAN-HCT) 160-12.5 MG per tablet Take 1 tablet by mouth daily.        Marland Kitchen ZYMAXID 0.5 % SOLN        Current Facility-Administered Medications  Medication Dose Route Frequency Provider Last Rate Last Dose  . 0.9 %  sodium chloride infusion  500 mL Intravenous Continuous Eliezer Bottom., MD,FACG         Objective:  Vital signs in last 24 hours: BP 155/81  Pulse 97  Temp 97.3 F (36.3 C)  Ht 4\' 11"  (1.499 m)  Wt 154 lb 3.2 oz (69.945 kg)  BMI 31.14 kg/m2   Physical Exam:    Sclerae unicteric  Oropharynx clear  No peripheral adenopathy  Lungs clear -- no rales or rhonchi  Heart regular rate and rhythm  Abdomen benign  MSK no focal spinal tenderness, no peripheral edema  Neuro nonfocal  Breast exam: Right breast no suspicious findings left breast status post lumpectomy no evidence of local recurrence  Lab Results:   BMET    Component Value Date/Time   NA 141 03/18/2011 1356   NA 141 03/18/2011 1356   K 3.9 03/18/2011 1356   K 3.9 03/18/2011 1356   CL 105 03/18/2011 1356   CL 105 03/18/2011 1356   CO2 25 03/18/2011 1356   CO2 25 03/18/2011 1356   GLUCOSE 93 03/18/2011 1356   GLUCOSE 93 03/18/2011 1356   BUN 16 03/18/2011 1356   BUN 16 03/18/2011 1356   CREATININE 0.95 03/18/2011 1356   CREATININE 0.95 03/18/2011 1356   CALCIUM 9.7 03/18/2011 1356   CALCIUM 9.7 03/18/2011 1356   GFRNONAA >60 09/20/2009 1632   GFRAA  Value: >60        The eGFR has been  calculated using the MDRD equation. This calculation has not been validated in all clinical situations. eGFR's persistently <60 mL/min signify possible Chronic Kidney Disease. 09/20/2009 1632     CMP     Component Value Date/Time   NA 141 03/18/2011 1356   NA 141 03/18/2011 1356   K 3.9 03/18/2011 1356   K 3.9 03/18/2011 1356   CL 105 03/18/2011 1356   CL 105 03/18/2011 1356   CO2 25 03/18/2011 1356   CO2 25 03/18/2011 1356   GLUCOSE 93 03/18/2011 1356   GLUCOSE 93 03/18/2011 1356   BUN 16 03/18/2011 1356   BUN 16 03/18/2011 1356   CREATININE 0.95 03/18/2011 1356   CREATININE 0.95 03/18/2011 1356   CALCIUM 9.7 03/18/2011 1356   CALCIUM 9.7 03/18/2011 1356   PROT 6.8 03/18/2011 1356   PROT 6.8 03/18/2011 1356   ALBUMIN 4.0 03/18/2011 1356   ALBUMIN 4.0 03/18/2011 1356   AST 20 03/18/2011 1356   AST 20 03/18/2011 1356   ALT 20 03/18/2011 1356   ALT 20 03/18/2011 1356   ALKPHOS 119* 03/18/2011 1356   ALKPHOS 119* 03/18/2011 1356   BILITOT 0.3 03/18/2011 1356   BILITOT 0.3 03/18/2011 1356   GFRNONAA >60 09/20/2009 1632   GFRAA  Value: >60        The eGFR has been calculated using the MDRD equation. This calculation has not been validated in all clinical situations. eGFR's persistently <60 mL/min signify possible Chronic Kidney Disease. 09/20/2009 1632    CBC    Component Value Date/Time   WBC 6.6 09/03/2009 0917   RBC 4.83 09/03/2009 0917   HGB 14.8 03/18/2011 1356   HGB 15.1* 09/03/2009 0917   HCT 43.8 03/18/2011 1356   HCT 43.8 09/03/2009 0917   PLT 268 03/18/2011 1356   PLT 244 09/03/2009 0917   MCV 91.7 03/18/2011 1356   MCV 90.6 09/03/2009 0917   MCHC 34.5 09/03/2009 0917   RDW 14.5 09/03/2009 0917   LYMPHSABS 2.1 09/03/2009 0917   MONOABS 0.5 09/03/2009 0917   EOSABS 0.1 03/18/2011 1356   EOSABS 0.1 09/03/2009 0917   BASOSABS 0.1 03/18/2011 1356   BASOSABS  0.0 09/03/2009 1478        Studies/Results: Mammography March of this year was unremarkable  Assessment:  77 year old [place] woman  status post left lumpectomy and sentinel lymph node sampling April of 2011 for a 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 Derrell Lolling in 6 months, shortly after her mammography. She has a slight elevation in the alkaline phosphatase. So far there is no trend. This is likely due to fatty liver. We will repeated before the next visit here  Frederick Klinger C 03/25/2011

## 2011-03-26 ENCOUNTER — Encounter (HOSPITAL_COMMUNITY): Payer: Self-pay

## 2011-03-28 ENCOUNTER — Encounter (HOSPITAL_COMMUNITY)
Admission: RE | Admit: 2011-03-28 | Discharge: 2011-03-28 | Disposition: A | Discharge status: Home / Self Care | Payer: Self-pay | Source: Ambulatory Visit | Attending: Cardiology | Admitting: Cardiology

## 2011-03-31 ENCOUNTER — Encounter (HOSPITAL_COMMUNITY)
Admission: RE | Admit: 2011-03-31 | Discharge: 2011-03-31 | Disposition: A | Discharge status: Home / Self Care | Payer: Self-pay | Source: Ambulatory Visit | Attending: Cardiology | Admitting: Cardiology

## 2011-04-02 ENCOUNTER — Encounter (HOSPITAL_COMMUNITY)
Admission: RE | Admit: 2011-04-02 | Discharge: 2011-04-02 | Disposition: A | Discharge status: Home / Self Care | Payer: Self-pay | Source: Ambulatory Visit | Attending: Cardiology | Admitting: Cardiology

## 2011-04-04 ENCOUNTER — Encounter (HOSPITAL_COMMUNITY): Payer: Self-pay

## 2011-04-07 ENCOUNTER — Encounter (HOSPITAL_COMMUNITY)
Admission: RE | Admit: 2011-04-07 | Discharge: 2011-04-07 | Disposition: A | Discharge status: Home / Self Care | Payer: Self-pay | Source: Ambulatory Visit | Attending: Cardiology | Admitting: Cardiology

## 2011-04-09 ENCOUNTER — Encounter (HOSPITAL_COMMUNITY)
Admission: RE | Admit: 2011-04-09 | Discharge: 2011-04-09 | Disposition: A | Discharge status: Home / Self Care | Payer: Self-pay | Source: Ambulatory Visit | Attending: Cardiology | Admitting: Cardiology

## 2011-04-11 ENCOUNTER — Encounter (HOSPITAL_COMMUNITY)
Admission: RE | Admit: 2011-04-11 | Discharge: 2011-04-11 | Disposition: A | Discharge status: Home / Self Care | Payer: Self-pay | Source: Ambulatory Visit | Attending: Cardiology | Admitting: Cardiology

## 2011-04-14 ENCOUNTER — Encounter (HOSPITAL_COMMUNITY)
Admission: RE | Admit: 2011-04-14 | Discharge: 2011-04-14 | Disposition: A | Discharge status: Home / Self Care | Payer: Self-pay | Source: Ambulatory Visit | Attending: Cardiology | Admitting: Cardiology

## 2011-04-14 DIAGNOSIS — E785 Hyperlipidemia, unspecified: Secondary | ICD-10-CM | POA: Insufficient documentation

## 2011-04-14 DIAGNOSIS — I10 Essential (primary) hypertension: Secondary | ICD-10-CM | POA: Insufficient documentation

## 2011-04-14 DIAGNOSIS — I252 Old myocardial infarction: Secondary | ICD-10-CM | POA: Insufficient documentation

## 2011-04-14 DIAGNOSIS — J984 Other disorders of lung: Secondary | ICD-10-CM | POA: Insufficient documentation

## 2011-04-14 DIAGNOSIS — Z853 Personal history of malignant neoplasm of breast: Secondary | ICD-10-CM | POA: Insufficient documentation

## 2011-04-14 DIAGNOSIS — Z5189 Encounter for other specified aftercare: Secondary | ICD-10-CM | POA: Insufficient documentation

## 2011-04-14 DIAGNOSIS — R0609 Other forms of dyspnea: Secondary | ICD-10-CM | POA: Insufficient documentation

## 2011-04-14 DIAGNOSIS — Z87891 Personal history of nicotine dependence: Secondary | ICD-10-CM | POA: Insufficient documentation

## 2011-04-14 DIAGNOSIS — J438 Other emphysema: Secondary | ICD-10-CM | POA: Insufficient documentation

## 2011-04-14 DIAGNOSIS — R0989 Other specified symptoms and signs involving the circulatory and respiratory systems: Secondary | ICD-10-CM | POA: Insufficient documentation

## 2011-04-16 ENCOUNTER — Encounter (HOSPITAL_COMMUNITY)
Admission: RE | Admit: 2011-04-16 | Discharge: 2011-04-16 | Disposition: A | Discharge status: Home / Self Care | Payer: Self-pay | Source: Ambulatory Visit | Attending: Cardiology | Admitting: Cardiology

## 2011-04-18 ENCOUNTER — Encounter (HOSPITAL_COMMUNITY): Payer: Self-pay

## 2011-04-21 ENCOUNTER — Encounter (HOSPITAL_COMMUNITY)
Admission: RE | Admit: 2011-04-21 | Discharge: 2011-04-21 | Disposition: A | Discharge status: Home / Self Care | Payer: Self-pay | Source: Ambulatory Visit | Attending: Cardiology | Admitting: Cardiology

## 2011-04-23 ENCOUNTER — Encounter (HOSPITAL_COMMUNITY)
Admission: RE | Admit: 2011-04-23 | Discharge: 2011-04-23 | Disposition: A | Discharge status: Home / Self Care | Payer: Self-pay | Source: Ambulatory Visit | Attending: Cardiology | Admitting: Cardiology

## 2011-04-25 ENCOUNTER — Encounter (HOSPITAL_COMMUNITY)
Admission: RE | Admit: 2011-04-25 | Discharge: 2011-04-25 | Disposition: A | Discharge status: Home / Self Care | Payer: Self-pay | Source: Ambulatory Visit | Attending: Cardiology | Admitting: Cardiology

## 2011-04-28 ENCOUNTER — Encounter (HOSPITAL_COMMUNITY)
Admission: RE | Admit: 2011-04-28 | Discharge: 2011-04-28 | Disposition: A | Discharge status: Home / Self Care | Payer: Self-pay | Source: Ambulatory Visit | Attending: Cardiology | Admitting: Cardiology

## 2011-04-30 ENCOUNTER — Encounter (HOSPITAL_COMMUNITY)
Admission: RE | Admit: 2011-04-30 | Discharge: 2011-04-30 | Disposition: A | Discharge status: Home / Self Care | Payer: Self-pay | Source: Ambulatory Visit | Attending: Cardiology | Admitting: Cardiology

## 2011-05-02 ENCOUNTER — Encounter (HOSPITAL_COMMUNITY)
Admission: RE | Admit: 2011-05-02 | Discharge: 2011-05-02 | Disposition: A | Discharge status: Home / Self Care | Payer: Self-pay | Source: Ambulatory Visit | Attending: Cardiology | Admitting: Cardiology

## 2011-05-05 ENCOUNTER — Encounter (HOSPITAL_COMMUNITY): Payer: Self-pay

## 2011-05-07 ENCOUNTER — Encounter (HOSPITAL_COMMUNITY): Payer: Self-pay

## 2011-05-09 ENCOUNTER — Encounter (HOSPITAL_COMMUNITY): Payer: Self-pay

## 2011-05-12 ENCOUNTER — Encounter (HOSPITAL_COMMUNITY): Payer: Self-pay

## 2011-05-13 DIAGNOSIS — R55 Syncope and collapse: Secondary | ICD-10-CM

## 2011-05-13 HISTORY — DX: Syncope and collapse: R55

## 2011-05-14 ENCOUNTER — Encounter (HOSPITAL_COMMUNITY): Payer: Self-pay

## 2011-05-16 ENCOUNTER — Encounter (HOSPITAL_COMMUNITY)
Admission: RE | Admit: 2011-05-16 | Discharge: 2011-05-16 | Disposition: A | Discharge status: Home / Self Care | Payer: Self-pay | Source: Ambulatory Visit | Attending: Cardiology | Admitting: Cardiology

## 2011-05-16 DIAGNOSIS — R0609 Other forms of dyspnea: Secondary | ICD-10-CM | POA: Insufficient documentation

## 2011-05-16 DIAGNOSIS — J438 Other emphysema: Secondary | ICD-10-CM | POA: Insufficient documentation

## 2011-05-16 DIAGNOSIS — E785 Hyperlipidemia, unspecified: Secondary | ICD-10-CM | POA: Insufficient documentation

## 2011-05-16 DIAGNOSIS — Z853 Personal history of malignant neoplasm of breast: Secondary | ICD-10-CM | POA: Insufficient documentation

## 2011-05-16 DIAGNOSIS — J984 Other disorders of lung: Secondary | ICD-10-CM | POA: Insufficient documentation

## 2011-05-16 DIAGNOSIS — I10 Essential (primary) hypertension: Secondary | ICD-10-CM | POA: Insufficient documentation

## 2011-05-16 DIAGNOSIS — R0989 Other specified symptoms and signs involving the circulatory and respiratory systems: Secondary | ICD-10-CM | POA: Insufficient documentation

## 2011-05-16 DIAGNOSIS — I252 Old myocardial infarction: Secondary | ICD-10-CM | POA: Insufficient documentation

## 2011-05-16 DIAGNOSIS — Z87891 Personal history of nicotine dependence: Secondary | ICD-10-CM | POA: Insufficient documentation

## 2011-05-16 DIAGNOSIS — Z5189 Encounter for other specified aftercare: Secondary | ICD-10-CM | POA: Insufficient documentation

## 2011-05-19 ENCOUNTER — Encounter (HOSPITAL_COMMUNITY)
Admission: RE | Admit: 2011-05-19 | Discharge: 2011-05-19 | Disposition: A | Discharge status: Home / Self Care | Payer: Self-pay | Source: Ambulatory Visit | Attending: Cardiology | Admitting: Cardiology

## 2011-05-20 ENCOUNTER — Telehealth: Payer: Self-pay | Admitting: Cardiology

## 2011-05-20 NOTE — Telephone Encounter (Signed)
Patient called, stating she has had 2 syncopal episodes recently.States she had one the first of 12/12 and one the week before Christmas.States she had no warning signs,no dizziness,no chest pain,no fast heart beat.States she feels good.Spoke with Dr.Jordan he advised to make appointment with Norma Fredrickson NP.Appointment scheduled with Norma Fredrickson NP 05/22/11 at 10:30 am.

## 2011-05-20 NOTE — Telephone Encounter (Signed)
Pt had a couple of episodes of syncope, one about 2 weeks ago, and one just prior to that, felt fine right before and fine right after, no problems since then but dtr wanted her to call to see what she needs to do

## 2011-05-21 ENCOUNTER — Encounter: Payer: Self-pay | Admitting: Nurse Practitioner

## 2011-05-21 ENCOUNTER — Encounter (HOSPITAL_COMMUNITY)
Admission: RE | Admit: 2011-05-21 | Discharge: 2011-05-21 | Disposition: A | Discharge status: Home / Self Care | Payer: Self-pay | Source: Ambulatory Visit | Attending: Cardiology | Admitting: Cardiology

## 2011-05-22 ENCOUNTER — Encounter: Payer: Self-pay | Admitting: Nurse Practitioner

## 2011-05-22 ENCOUNTER — Ambulatory Visit (HOSPITAL_COMMUNITY): Payer: Medicare Other | Attending: Cardiology

## 2011-05-22 ENCOUNTER — Ambulatory Visit (INDEPENDENT_AMBULATORY_CARE_PROVIDER_SITE_OTHER)
Admission: RE | Admit: 2011-05-22 | Discharge: 2011-05-22 | Disposition: A | Discharge status: Home / Self Care | Payer: Medicare Other | Source: Ambulatory Visit | Attending: Nurse Practitioner | Admitting: Nurse Practitioner

## 2011-05-22 ENCOUNTER — Ambulatory Visit (INDEPENDENT_AMBULATORY_CARE_PROVIDER_SITE_OTHER): Payer: Medicare Other | Admitting: Nurse Practitioner

## 2011-05-22 VITALS — BP 110/72 | HR 70 | Ht 60.0 in | Wt 154.0 lb

## 2011-05-22 DIAGNOSIS — I2510 Atherosclerotic heart disease of native coronary artery without angina pectoris: Secondary | ICD-10-CM

## 2011-05-22 DIAGNOSIS — R55 Syncope and collapse: Secondary | ICD-10-CM | POA: Insufficient documentation

## 2011-05-22 DIAGNOSIS — R5381 Other malaise: Secondary | ICD-10-CM

## 2011-05-22 DIAGNOSIS — R5383 Other fatigue: Secondary | ICD-10-CM | POA: Insufficient documentation

## 2011-05-22 DIAGNOSIS — E785 Hyperlipidemia, unspecified: Secondary | ICD-10-CM | POA: Insufficient documentation

## 2011-05-22 DIAGNOSIS — I10 Essential (primary) hypertension: Secondary | ICD-10-CM | POA: Insufficient documentation

## 2011-05-22 HISTORY — PX: US ECHOCARDIOGRAPHY: HXRAD669

## 2011-05-22 LAB — BASIC METABOLIC PANEL
BUN: 18 mg/dL (ref 6–23)
CO2: 26 mEq/L (ref 19–32)
Calcium: 10 mg/dL (ref 8.4–10.5)
Chloride: 106 mEq/L (ref 96–112)
Creatinine, Ser: 1.1 mg/dL (ref 0.4–1.2)
GFR: 52 mL/min — ABNORMAL LOW (ref >60.00)
Glucose, Bld: 105 mg/dL — ABNORMAL HIGH (ref 70–99)
Potassium: 4.3 mEq/L (ref 3.5–5.1)
Sodium: 140 mEq/L (ref 135–145)

## 2011-05-22 LAB — CBC WITH DIFFERENTIAL/PLATELET
Basophils Absolute: 0 10*3/uL (ref 0.0–0.1)
Basophils Relative: 0.3 % (ref 0.0–3.0)
Eosinophils Absolute: 0.1 10*3/uL (ref 0.0–0.7)
Eosinophils Relative: 1.3 % (ref 0.0–5.0)
HCT: 44 % (ref 36.0–46.0)
Hemoglobin: 14.8 g/dL (ref 12.0–15.0)
Lymphocytes Relative: 20.9 % (ref 12.0–46.0)
Lymphs Abs: 1.7 10*3/uL (ref 0.7–4.0)
MCHC: 33.7 g/dL (ref 30.0–36.0)
MCV: 92.1 fl (ref 78.0–100.0)
Monocytes Absolute: 0.5 10*3/uL (ref 0.1–1.0)
Monocytes Relative: 6 % (ref 3.0–12.0)
Neutro Abs: 5.8 10*3/uL (ref 1.4–7.7)
Neutrophils Relative %: 71.5 % (ref 43.0–77.0)
Platelets: 270 10*3/uL (ref 150.0–400.0)
RBC: 4.77 Mil/uL (ref 3.87–5.11)
RDW: 15.5 % — ABNORMAL HIGH (ref 11.5–14.6)
WBC: 8.1 10*3/uL (ref 4.5–10.5)

## 2011-05-22 LAB — TSH: TSH: 1.02 u[IU]/mL (ref 0.35–5.50)

## 2011-05-22 NOTE — Patient Instructions (Addendum)
We are going to do several tests to evaluate your passing out spells. This will include labs, a scan on your head, an ultrasound of your heart, another stress test and wearing a monitor to look at your rhythm.   You need to not be driving while we are trying to find out why you passed out.   We will see you back in one month.  Call the [facility] office at [phone] if you have any questions, problems or concerns.

## 2011-05-22 NOTE — Assessment & Plan Note (Addendum)
Her spells are very concerning. She does need further evaluation. I have ordered labs today. We are placing an event monitor. We will arrange for carotid dopplers, CT of the head and echo. She does need stress testing as well. I have asked her to not drive. She has no warning with these spells. I have spoken with Dr. [place] and reviewed her case with him. He is in agreement with my plan. I will see her in a month. Patient is agreeable to this plan and will call if any problems develop in the interim.

## 2011-05-22 NOTE — Progress Notes (Signed)
[name] Date of Birth: Sep 26, 1933 Medical Record #[id number]  History of Present Illness: Ms. Melinda Mora is seen today for a work in visit. She is seen for Dr. Sw[place]il[place]. She is a 78 year old female who has a history of known CAD with prior PCI. She has multiple other health issues. She was last seen in the office in September of last year. She come in today after calling in to report 2 syncopal episodes that have recently occurred. She had her first episode on December the 12th. She had just gotten home from the grocery store and was standing at the bar in her house. The next thing she knew, she was on the floor. The second episode occurred about a week before Christmas. She had just returned from going out to dinner. She was walking up her walkway and "fell out again". She was incontinent of urine for this episode. No bowel incontinence. No tongue biting. No apparent seizure activity. The second episode was witnessed by her family. They did not note any other findings and she did not seek medical attention. She has had no further episodes. She had no warning. She is not having chest pain. She continues to have issues with her balance but these spells were not from being unsteady. She has not suffered any significant injury. She is still driving.   Current Outpatient Prescriptions on File Prior to Visit  Medication Sig Dispense Refill  . albuterol (VENTOLIN HFA) 108 (90 BASE) MCG/ACT inhaler Inhale 2 puffs into the lungs every 6 (six) hours as needed.        Marland Kitchen alendronate (FOSAMAX) 70 MG tablet Take 70 mg by mouth every 7 (seven) days. Take with a full glass of water on an empty stomach.       . ALPRAZolam (XANAX) 0.25 MG tablet Take 0.25 mg by mouth 2 (two) times daily as needed.        Marland Kitchen amLODipine (NORVASC) 5 MG tablet TAKE 1 TABLET DAILY  90 tablet  3  . aspirin 325 MG tablet Take 325 mg by mouth daily.        Marland Kitchen atorvastatin (LIPITOR) 40 MG tablet TAKE 1 TABLET DAILY  90 tablet  3  . Calcium  Lactate 750 MG TABS Take 1 tablet by mouth daily.        . cilostazol (PLETAL) 50 MG tablet Take 50 mg by mouth 2 (two) times daily.       . clopidogrel (PLAVIX) 75 MG tablet Take 75 mg by mouth daily.        . cycloSPORINE (RESTASIS) 0.05 % ophthalmic emulsion 1 drop 2 (two) times daily.        . fish oil-omega-3 fatty acids 1000 MG capsule Take 2 g by mouth daily. Taking Mega Red      . memantine (NAMENDA) 5 MG tablet Take 10 mg by mouth daily.       . metoprolol (TOPROL-XL) 50 MG 24 hr tablet Take 50 mg by mouth 2 (two) times daily.      . mirtazapine (REMERON) 30 MG tablet Take 30 mg by mouth at bedtime.        . Multiple Vitamin (MULTIVITAMIN) tablet Take 1 tablet by mouth daily.        . nitroGLYCERIN (NITROSTAT) 0.4 MG SL tablet Place 0.4 mg under the tongue every 5 (five) minutes as needed.        . tiotropium (SPIRIVA) 18 MCG inhalation capsule Place 18 mcg into inhaler and  inhale daily.        . valsartan-hydrochlorothiazide (DIOVAN-HCT) 160-12.5 MG per tablet Take 0.5 tablets by mouth daily.       Marland Kitchen DISCONTD: aspirin 81 MG tablet Take 81 mg by mouth daily.         Current Facility-Administered Medications on File Prior to Visit  Medication Dose Route Frequency Provider Last Rate Last Dose  . 0.9 %  sodium chloride infusion  500 mL Intravenous Continuous Eliezer Bottom., MD,FACG        Allergies  Allergen Reactions  . Codeine     REACTION: numbing  . Penicillins     REACTION: hives  . Shellfish Allergy     Past Medical History  Diagnosis Date  . COPD (chronic obstructive pulmonary disease)   . Hyperlipidemia   . Hypertension   . Depression   . Osteoarthritis   . Allergic rhinitis   . Breast cancer     LT1bN0  . Lung nodule     Had follow up CT in Jan 2012  . Raynaud disease   . Adenomatous polyp 03/2004  . Heart attack   . Diverticulosis   . CAD (coronary artery disease)     Remote inferior MI in 2008. Extensive stenting of the RCA, s/p repeat stenting of  the RCA in Dec 2010 due to restenosis    Past Surgical History  Procedure Date  . Breast lumpectomy   . Cataract extraction   . Tympanostomy tube placement   . Angioplasty 9/09, 12/10  . Colonoscopy   . Coronary stent placement 04/19/2009    EF 65%  . US echocardiography 04/29/2007    ef 60%  . Cardiovascular stress test 04/10/2009    EF 65%. MODERATE AREA OF MODERATE ISCHEMIA IN THE INFERIOR WALL    History  Smoking status  . Former Smoker  . Quit date: 01/11/2007  Smokeless tobacco  . Not on file    History  Alcohol Use No    Family History  Problem Relation Age of Onset  . Coronary artery disease Father     & Mother  . Heart attack Father   . Gout Father   . Osteoarthritis Mother   . Heart disease Mother   . Hypertension Sister   . Heart disease Sister   . Breast cancer      Aunts  . Uterine cancer Sister   . Colon cancer Neg Hx     Review of Systems: The review of systems is positive for fatigue. She has chronic shortness of breath. Has chronic issues with her balance.  She does fall frequently.  All other systems were reviewed and are negative.  Physical Exam: BP 110/72  Pulse 70  Ht 5' (1.524 m)  Wt 154 lb (69.854 kg)  BMI 30.08 kg/m2 Patient is very pleasant and in no acute distress. Skin is warm and dry. Color is normal.  HEENT is unremarkable. Normocephalic/atraumatic. PERRL. Sclera are nonicteric. Neck is supple. No masses. No JVD. Lungs are clear. Cardiac exam shows a regular rate and rhythm. Abdomen is soft. Extremities are without edema. Gait and ROM are intact. No gross neurologic deficits noted.  LABORATORY DATA: EKG shows sinus rhythm. She has inferolateral ST/T wave changes that appear unchanged.    Assessment / Plan:

## 2011-05-22 NOTE — Assessment & Plan Note (Signed)
She is currently not having any chest pain. Last PCI in 2010 following abnormal stress test.

## 2011-05-23 ENCOUNTER — Telehealth (HOSPITAL_COMMUNITY): Payer: Self-pay | Admitting: *Deleted

## 2011-05-23 ENCOUNTER — Encounter (HOSPITAL_COMMUNITY): Payer: Self-pay

## 2011-05-23 ENCOUNTER — Encounter: Payer: Self-pay | Admitting: Cardiology

## 2011-05-23 DIAGNOSIS — R55 Syncope and collapse: Secondary | ICD-10-CM

## 2011-05-23 NOTE — Telephone Encounter (Signed)
[phone] Returned call to patient this morning. Pt informed me that she would need to discharge from the program at this time per MD orders. Per pt she passed out twice over the holidays and is being evaluated for cause of syncopal episodes. Pt states she's been instructed to hold exercise for 30 days. Pt will d/c from the program at this time. Pt was informed of monthly fee already assessed for the month of January. Pt plans to re-enroll when cleared by her physician.

## 2011-05-23 NOTE — Telephone Encounter (Signed)
Error

## 2011-05-26 ENCOUNTER — Encounter (HOSPITAL_COMMUNITY): Payer: Self-pay

## 2011-05-26 NOTE — Telephone Encounter (Signed)
Patient's daughter was called and told patient's labs and echo done 05/22/11 are okay.

## 2011-05-26 NOTE — Telephone Encounter (Signed)
New Msg: pt daughter calling wanting to speak with nurse/MD regarding pt recent appt and upcoming appts/procedures/tests. Please return pt daughter call to discuss further.

## 2011-05-27 ENCOUNTER — Ambulatory Visit (HOSPITAL_COMMUNITY): Payer: Medicare Other | Attending: Cardiovascular Disease | Admitting: Radiology

## 2011-05-27 ENCOUNTER — Telehealth: Payer: Self-pay | Admitting: Cardiology

## 2011-05-27 DIAGNOSIS — R0602 Shortness of breath: Secondary | ICD-10-CM

## 2011-05-27 DIAGNOSIS — Z9861 Coronary angioplasty status: Secondary | ICD-10-CM | POA: Insufficient documentation

## 2011-05-27 DIAGNOSIS — I4949 Other premature depolarization: Secondary | ICD-10-CM

## 2011-05-27 DIAGNOSIS — J4489 Other specified chronic obstructive pulmonary disease: Secondary | ICD-10-CM | POA: Insufficient documentation

## 2011-05-27 DIAGNOSIS — E785 Hyperlipidemia, unspecified: Secondary | ICD-10-CM | POA: Insufficient documentation

## 2011-05-27 DIAGNOSIS — R5381 Other malaise: Secondary | ICD-10-CM | POA: Insufficient documentation

## 2011-05-27 DIAGNOSIS — I252 Old myocardial infarction: Secondary | ICD-10-CM | POA: Insufficient documentation

## 2011-05-27 DIAGNOSIS — R55 Syncope and collapse: Secondary | ICD-10-CM

## 2011-05-27 DIAGNOSIS — Z87891 Personal history of nicotine dependence: Secondary | ICD-10-CM | POA: Insufficient documentation

## 2011-05-27 DIAGNOSIS — J449 Chronic obstructive pulmonary disease, unspecified: Secondary | ICD-10-CM | POA: Insufficient documentation

## 2011-05-27 DIAGNOSIS — I2510 Atherosclerotic heart disease of native coronary artery without angina pectoris: Secondary | ICD-10-CM | POA: Insufficient documentation

## 2011-05-27 DIAGNOSIS — I10 Essential (primary) hypertension: Secondary | ICD-10-CM | POA: Insufficient documentation

## 2011-05-27 DIAGNOSIS — Z8249 Family history of ischemic heart disease and other diseases of the circulatory system: Secondary | ICD-10-CM | POA: Insufficient documentation

## 2011-05-27 MED ORDER — TECHNETIUM TC 99M TETROFOSMIN IV KIT
33.0000 | PACK | Freq: Once | INTRAVENOUS | Status: AC | PRN
  Administered 2011-05-27: 33 via INTRAVENOUS

## 2011-05-27 MED ORDER — TECHNETIUM TC 99M TETROFOSMIN IV KIT
11.0000 | PACK | Freq: Once | INTRAVENOUS | Status: AC | PRN
  Administered 2011-05-27: 11 via INTRAVENOUS

## 2011-05-27 MED ORDER — REGADENOSON 0.4 MG/5ML IV SOLN
0.4000 mg | Freq: Once | INTRAVENOUS | Status: AC
  Administered 2011-05-27: 0.4 mg via INTRAVENOUS

## 2011-05-27 NOTE — Telephone Encounter (Signed)
Fu call °Patient returning your call °

## 2011-05-27 NOTE — Progress Notes (Signed)
[facility] SITE 3 NUCLEAR MED [address] [place] [address] [phone]  Cardiology Nuclear Med Study  Melinda Mora is a 78 y.o. female [id number] 1933/06/29   Nuclear Med Background Indication for Stress Test:  Evaluation for Ischemia and Stent Patency History: 09'- Angioplasty, COPD,05/21/10- Echo EF 55-65%NML,10' Heart Catheterization EF 65%n/o CAD(RCA-90%),08' Myocardial Infarction-inf,10'- Myocardial Perfusion Study EF 65%:mod-area of isch. Inf wall and 08'/10/ Stents *RCA Cardiac Risk Factors: Family History - CAD, History of Smoking, Hypertension and Lipids  Symptoms:  Fatigue, SOB(chronic) and Syncope(x2 recent events)   Nuclear Pre-Procedure Caffeine/Decaff Intake:  None NPO After: 7:00pm   Lungs:  CLEAR IV 0.9% NS with Angio Cath:  22g  IV Site: R Wrist  IV Started by:  Cathlyn Parsons, RN  Chest Size (in):  34 Cup Size: A  Height: 5' (1.524 m)  Weight:  152 lb (68.947 kg)  BMI:  Body mass index is 29.69 kg/(m^2). Tech Comments:  Toprol held x 24 hrs    Nuclear Med Study 1 or 2 day study: 1 day  Stress Test Type:  Lexiscan  Reading MD: [name], MD  Order Authorizing Provider:  Vonna Drafts  Resting Radionuclide: Technetium 61m Tetrofosmin  Resting Radionuclide Dose: 11.0 mCi   Stress Radionuclide:  Technetium 54m Tetrofosmin  Stress Radionuclide Dose: 33.0 mCi           Stress Protocol Rest HR: 88 Stress HR: 121  Rest BP: 136/60 Stress BP: 157/63  Exercise Time (min): n/a METS: n/a   Predicted Max HR: 145 bpm % Max HR: 83.45 bpm Rate Pressure Product: 84696   Dose of Adenosine (mg):  n/a Dose of Lexiscan: 0.4 mg  Dose of Atropine (mg): n/a Dose of Dobutamine: n/a mcg/kg/min (at max HR)  Stress Test Technologist: Frederick Peers, EMT-P  Nuclear Technologist:  Domenic Polite, CNMT     Rest Procedure:  Myocardial perfusion imaging was performed at rest 45 minutes following the intravenous administration of Technetium 25m  Tetrofosmin. Rest ECG: NSR  Stress Procedure:  The patient received IV Lexiscan 0.4 mg over 15-seconds.  Technetium 31m Tetrofosmin injected at 30-seconds.  There were non specific ST changes/ multifocal PVCs/cuplets with Lexiscan. Quantitative spect images were obtained after a 45 minute delay. Stress ECG: No significant change from baseline ECG  QPS Raw Data Images:  Normal; no motion artifact; normal heart/lung ratio. Stress Images:  Normal homogeneous uptake in all areas of the myocardium. Rest Images:  Normal homogeneous uptake in all areas of the myocardium. Subtraction (SDS):  Normal Transient Ischemic Dilatation (Normal <1.22):  0.91 Lung/Heart Ratio (Normal <0.45):  0.29  Quantitative Gated Spect Images QGS EDV:  37 ml QGS ESV:  12 ml QGS cine images:  NL LV Function; NL Wall Motion QGS EF: 69%  Impression Exercise Capacity:  Lexiscan with no exercise. BP Response:  Normal blood pressure response. Clinical Symptoms:  There is dyspnea. ECG Impression:  Baseline ECG with significant ST/T wave changes Comparison with Prior Nuclear Study: No images to compare  Overall Impression:  Low risk stress nuclear study.  Horizontal images suggest small area of reversability in lateral wall not confirmed In short axis views.  SDS only 3  Baseline ECG with ST/T wave changes   [name]

## 2011-05-28 ENCOUNTER — Encounter (HOSPITAL_COMMUNITY): Payer: Self-pay

## 2011-05-28 HISTORY — PX: CARDIOVASCULAR STRESS TEST: SHX262

## 2011-05-28 NOTE — Telephone Encounter (Signed)
Patient was called and told CT head revealed no new abnormalities,myoview normal.

## 2011-05-29 ENCOUNTER — Encounter (INDEPENDENT_AMBULATORY_CARE_PROVIDER_SITE_OTHER): Payer: Medicare Other | Admitting: Cardiology

## 2011-05-29 DIAGNOSIS — R55 Syncope and collapse: Secondary | ICD-10-CM

## 2011-05-29 DIAGNOSIS — I2510 Atherosclerotic heart disease of native coronary artery without angina pectoris: Secondary | ICD-10-CM

## 2011-05-29 DIAGNOSIS — I6529 Occlusion and stenosis of unspecified carotid artery: Secondary | ICD-10-CM

## 2011-05-30 ENCOUNTER — Encounter (HOSPITAL_COMMUNITY): Payer: Self-pay

## 2011-06-02 ENCOUNTER — Encounter (HOSPITAL_COMMUNITY): Payer: Self-pay

## 2011-06-04 ENCOUNTER — Encounter (HOSPITAL_COMMUNITY): Payer: Self-pay

## 2011-06-06 ENCOUNTER — Encounter (HOSPITAL_COMMUNITY): Payer: Self-pay

## 2011-06-09 ENCOUNTER — Encounter (HOSPITAL_COMMUNITY): Payer: Self-pay

## 2011-06-11 ENCOUNTER — Encounter (HOSPITAL_COMMUNITY): Payer: Self-pay

## 2011-06-11 ENCOUNTER — Other Ambulatory Visit: Payer: Self-pay | Admitting: *Deleted

## 2011-06-11 DIAGNOSIS — E7800 Pure hypercholesterolemia, unspecified: Secondary | ICD-10-CM

## 2011-06-11 MED ORDER — AMLODIPINE BESYLATE 5 MG PO TABS: 5.0000 mg | ORAL_TABLET | Freq: Every day | ORAL | Status: DC

## 2011-06-11 MED ORDER — CLOPIDOGREL BISULFATE 75 MG PO TABS: 75.0000 mg | ORAL_TABLET | Freq: Every day | ORAL | Status: DC

## 2011-06-11 MED ORDER — ATORVASTATIN CALCIUM 40 MG PO TABS: 40.0000 mg | ORAL_TABLET | Freq: Every day | ORAL | Status: DC

## 2011-06-12 ENCOUNTER — Other Ambulatory Visit: Payer: Self-pay | Admitting: *Deleted

## 2011-06-12 ENCOUNTER — Telehealth: Payer: Self-pay | Admitting: Cardiology

## 2011-06-12 MED ORDER — METOPROLOL TARTRATE 50 MG PO TABS: 50.0000 mg | ORAL_TABLET | Freq: Two times a day (BID) | ORAL | Status: DC

## 2011-06-12 NOTE — Telephone Encounter (Signed)
Patient's daughter Olene Craven was called back.She stated she spoke with her mother this morning and was following up on a phone call her mother got from our office yesterday,Prescription request was faxed 06/11/11 and we needed to verify if patient was taking Metoprolol tartrate or succinate.Patient is taking metoprolol tartrate 50 mg twice a day.Presciption will be faxed back to [facility] today for metoprolol tartrate 50 mg twice a day.

## 2011-06-12 NOTE — Telephone Encounter (Signed)
FU Call: Pt daughter returning call to our office regarding mg of pt medication. Please return pt daughter call to discuss further.

## 2011-06-13 ENCOUNTER — Encounter (HOSPITAL_COMMUNITY): Payer: Medicare Other

## 2011-06-23 ENCOUNTER — Ambulatory Visit: Payer: Self-pay | Admitting: Nurse Practitioner

## 2011-06-23 ENCOUNTER — Ambulatory Visit (INDEPENDENT_AMBULATORY_CARE_PROVIDER_SITE_OTHER): Payer: Medicare Other | Admitting: Nurse Practitioner

## 2011-06-23 ENCOUNTER — Encounter: Payer: Self-pay | Admitting: Nurse Practitioner

## 2011-06-23 VITALS — BP 120/70 | HR 76 | Ht 59.0 in | Wt 154.0 lb

## 2011-06-23 DIAGNOSIS — R55 Syncope and collapse: Secondary | ICD-10-CM

## 2011-06-23 DIAGNOSIS — I472 Ventricular tachycardia: Secondary | ICD-10-CM

## 2011-06-23 NOTE — Assessment & Plan Note (Signed)
Her tests have been satisfactory except for the event monitor. She has had several runs of non sustained VT. We suspect this is the etiology for her syncope. I have discussed her case with Dr. Sw[place]. Will go ahead and refer to EP for further evaluation. She is already on beta blocker therapy. I have left her on her current regimen for now. Will continue to restrict her driving. Patient and daughter are agreeable to this plan and will call if any problems develop in the interim.

## 2011-06-23 NOTE — Progress Notes (Signed)
[name] Date of Birth: 05/20/1933 Medical Record #[id number]  History of Present Illness: Melinda Mora is seen back today for a one month visit. She is seen for Dr. [place]. She has had recurrent syncope. She had had 2 recent syncopal episodes prior to that visit. She has had an extensive work up which has included stress testing that is felt to be normal. She has had a normal echo. Her carotid dopplers showed just mild disease. CT of the head showed three old strokes, but nothing acute. Lab work was satisfactory.   Her event monitor however, has shown several runs of NSTVT.   She comes in today. She is here with her daughter. She has not had any spells over this past 30 days. She wants to go back to driving. No chest pain. Has stable shortness of breath. Balance remains an issue.   Current Outpatient Prescriptions on File Prior to Visit  Medication Sig Dispense Refill  . albuterol (VENTOLIN HFA) 108 (90 BASE) MCG/ACT inhaler Inhale 2 puffs into the lungs every 6 (six) hours as needed.        Marland Kitchen alendronate (FOSAMAX) 70 MG tablet Take 70 mg by mouth every 7 (seven) days. Take with a full glass of water on an empty stomach.       . ALPRAZolam (XANAX) 0.25 MG tablet Take 0.25 mg by mouth 2 (two) times daily as needed.        Marland Kitchen amLODipine (NORVASC) 5 MG tablet Take 1 tablet (5 mg total) by mouth daily.  90 tablet  3  . atorvastatin (LIPITOR) 40 MG tablet Take 1 tablet (40 mg total) by mouth daily.  90 tablet  3  . Calcium Lactate 750 MG TABS Take 1 tablet by mouth daily.        . cilostazol (PLETAL) 50 MG tablet Take 50 mg by mouth 2 (two) times daily.       . clopidogrel (PLAVIX) 75 MG tablet Take 1 tablet (75 mg total) by mouth daily.  90 tablet  3  . cycloSPORINE (RESTASIS) 0.05 % ophthalmic emulsion 1 drop 2 (two) times daily.        . fish oil-omega-3 fatty acids 1000 MG capsule Take 2 g by mouth daily. Taking Mega Red      . memantine (NAMENDA) 5 MG tablet Take 10 mg by mouth daily.       .  metoprolol (LOPRESSOR) 50 MG tablet Take 1 tablet (50 mg total) by mouth 2 (two) times daily.  180 tablet  3  . mirtazapine (REMERON) 30 MG tablet Take 30 mg by mouth at bedtime.        . Multiple Vitamin (MULTIVITAMIN) tablet Take 1 tablet by mouth daily.        . nitroGLYCERIN (NITROSTAT) 0.4 MG SL tablet Place 0.4 mg under the tongue every 5 (five) minutes as needed.        . tiotropium (SPIRIVA) 18 MCG inhalation capsule Place 18 mcg into inhaler and inhale daily.        . valsartan-hydrochlorothiazide (DIOVAN-HCT) 160-12.5 MG per tablet Take 0.5 tablets by mouth daily.       Marland Kitchen DISCONTD: aspirin 325 MG tablet Take 325 mg by mouth daily.         Current Facility-Administered Medications on File Prior to Visit  Medication Dose Route Frequency Provider Last Rate Last Dose  . 0.9 %  sodium chloride infusion  500 mL Intravenous Continuous Eliezer Bottom., MD,FACG  Allergies  Allergen Reactions  . Codeine     REACTION: numbing  . Penicillins     REACTION: hives  . Shellfish Allergy     Past Medical History  Diagnosis Date  . COPD (chronic obstructive pulmonary disease)   . Hyperlipidemia   . Hypertension   . Depression   . Osteoarthritis   . Allergic rhinitis   . Breast cancer     LT1bN0  . Lung nodule     Had follow up CT in Jan 2012  . Raynaud disease   . Adenomatous polyp 03/2004  . Heart attack 2008    Remote inferior MI  . Diverticulosis   . CAD (coronary artery disease)     Remote inferior MI in 2008. Extensive stenting of the RCA, s/p repeat stenting of the RCA in Dec 2010 due to restenosis  . Syncope Jan 2013    NSVT noted on event monitor    Past Surgical History  Procedure Date  . Breast lumpectomy   . Cataract extraction   . Tympanostomy tube placement   . Angioplasty 9/09, 12/10  . Colonoscopy   . Coronary stent placement 04/19/2009    EF 65%  . US echocardiography 04/29/2007    ef 60%  . Cardiovascular stress test 04/10/2009    EF 65%.  MODERATE AREA OF MODERATE ISCHEMIA IN THE INFERIOR WALL    History  Smoking status  . Former Smoker  . Quit date: 01/11/2007  Smokeless tobacco  . Not on file    History  Alcohol Use No    Family History  Problem Relation Age of Onset  . Coronary artery disease Father     & Mother  . Heart attack Father   . Gout Father   . Osteoarthritis Mother   . Heart disease Mother   . Hypertension Sister   . Heart disease Sister   . Breast cancer      Aunts  . Uterine cancer Sister   . Colon cancer Neg Hx     Review of Systems: The review of systems is per the HPI.  All other systems were reviewed and are negative.  Physical Exam: BP 120/70  Pulse 76  Ht 4\' 11"  (1.499 m)  Wt 154 lb (69.854 kg)  BMI 31.10 kg/m2 Patient is very pleasant and in no acute distress. Skin is warm and dry. Color is normal.  HEENT is unremarkable. Normocephalic/atraumatic. PERRL. Sclera are nonicteric. Neck is supple. No masses. No JVD. Lungs are clear. Cardiac exam shows a regular rate and rhythm. Abdomen is soft. Extremities are without edema. Gait and ROM are intact. No gross neurologic deficits noted.  LABORATORY DATA:   Assessment / Plan:

## 2011-06-23 NOTE — Patient Instructions (Signed)
Your monitor shows that you are having runs of non sustained ventricular tachycardia. This is probably why you are passing out.  Stay on your current medicines.  We are going to get you to see Dr. Ladona Ridgel or Dr. Johney Frame to discuss.  Do not drive.

## 2011-06-24 ENCOUNTER — Telehealth: Payer: Self-pay | Admitting: Cardiology

## 2011-06-24 NOTE — Telephone Encounter (Signed)
Patient's daughter was called,spoke to Norma Fredrickson NPadvised not to go to cardiac rehab.

## 2011-06-24 NOTE — Telephone Encounter (Signed)
New problem:  Patient daughter calling, has question regarding should patient continue with cardiac rehab. Patient was seen on yesterday by Norma Fredrickson.

## 2011-07-08 ENCOUNTER — Other Ambulatory Visit: Payer: Self-pay | Admitting: Internal Medicine

## 2011-07-08 DIAGNOSIS — Z853 Personal history of malignant neoplasm of breast: Secondary | ICD-10-CM

## 2011-07-16 ENCOUNTER — Encounter: Payer: Self-pay | Admitting: Internal Medicine

## 2011-07-16 ENCOUNTER — Ambulatory Visit (INDEPENDENT_AMBULATORY_CARE_PROVIDER_SITE_OTHER): Payer: Medicare Other | Admitting: Internal Medicine

## 2011-07-16 DIAGNOSIS — I10 Essential (primary) hypertension: Secondary | ICD-10-CM

## 2011-07-16 DIAGNOSIS — R55 Syncope and collapse: Secondary | ICD-10-CM

## 2011-07-16 DIAGNOSIS — I472 Ventricular tachycardia, unspecified: Secondary | ICD-10-CM

## 2011-07-16 MED ORDER — AMIODARONE HCL 200 MG PO TABS: 200.0000 mg | ORAL_TABLET | Freq: Every day | ORAL | Status: DC

## 2011-07-16 NOTE — Patient Instructions (Addendum)
Your physician recommends that you schedule a follow-up appointment in: 6 weeks with Dr Ladona Ridgel    Your physician has recommended you make the following change in your medication:  1) STOP Amlodipine 2) Start Amiodarone 200mg  daily

## 2011-07-16 NOTE — Progress Notes (Signed)
HPI Melinda Mora is referred today by Norma Fredrickson for evaluation of VT. she is a very pleasant 78 year old woman with hypertension, coronary disease, COPD, and a recent diagnosis of ventricular tachycardia made by wearing a cardiac monitor. The patient has a wide QRS tachycardia at approximately 150-60 beats per minute. I suspect she has right ventricular outflow tract ventricular tachycardia as she has preserved left ventricular systolic function and the morphology of the QRS is positive in the same way that her intrinsic sinus rate is positive. The patient does not have chest pain. She had an exercise test which demonstrated no cardiac ischemia. She has normal left ventricular systolic function. She has had 2 episodes of syncope. It is unclear whether these are related to her tachycardia or not. When she wore the cardiac monitor, she did not experience palpitations when she had VT. Allergies  Allergen Reactions  . Codeine     REACTION: numbing  . Penicillins     REACTION: hives  . Shellfish Allergy      Current Outpatient Prescriptions  Medication Sig Dispense Refill  . albuterol (VENTOLIN HFA) 108 (90 BASE) MCG/ACT inhaler Inhale 2 puffs into the lungs every 6 (six) hours as needed.        Marland Kitchen alendronate (FOSAMAX) 70 MG tablet Take 70 mg by mouth every 7 (seven) days. Take with a full glass of water on an empty stomach.       . ALPRAZolam (XANAX) 0.25 MG tablet Take 0.25 mg by mouth 2 (two) times daily as needed.        Marland Kitchen aspirin 81 MG tablet Take 81 mg by mouth daily.      Marland Kitchen atorvastatin (LIPITOR) 40 MG tablet Take 1 tablet (40 mg total) by mouth daily.  90 tablet  3  . Calcium Lactate 750 MG TABS Take 1 tablet by mouth daily.        . cilostazol (PLETAL) 50 MG tablet Take 50 mg by mouth 2 (two) times daily.       . clopidogrel (PLAVIX) 75 MG tablet Take 1 tablet (75 mg total) by mouth daily.  90 tablet  3  . cycloSPORINE (RESTASIS) 0.05 % ophthalmic emulsion 1 drop 2 (two) times daily.         . fish oil-omega-3 fatty acids 1000 MG capsule Take 2 g by mouth daily. Taking Mega Red      . memantine (NAMENDA) 5 MG tablet Take 10 mg by mouth 2 (two) times daily.       . metoprolol (LOPRESSOR) 50 MG tablet Take 1 tablet (50 mg total) by mouth 2 (two) times daily.  180 tablet  3  . mirtazapine (REMERON) 30 MG tablet Take 30 mg by mouth at bedtime.        . Multiple Vitamin (MULTIVITAMIN) tablet Take 1 tablet by mouth daily.        . nitroGLYCERIN (NITROSTAT) 0.4 MG SL tablet Place 0.4 mg under the tongue every 5 (five) minutes as needed.        . tiotropium (SPIRIVA) 18 MCG inhalation capsule Place 18 mcg into inhaler and inhale daily.        . valsartan-hydrochlorothiazide (DIOVAN-HCT) 160-12.5 MG per tablet Take 0.5 tablets by mouth daily.       Marland Kitchen amiodarone (PACERONE) 200 MG tablet Take 1 tablet (200 mg total) by mouth daily.  30 tablet  11   Current Facility-Administered Medications  Medication Dose Route Frequency Provider Last Rate Last Dose  . 0.9 %  sodium chloride infusion  500 mL Intravenous Continuous Eliezer Bottom., MD,FACG         Past Medical History  Diagnosis Date  . COPD (chronic obstructive pulmonary disease)   . Hyperlipidemia   . Hypertension   . Depression   . Osteoarthritis   . Allergic rhinitis   . Breast cancer     LT1bN0  . Lung nodule     Had follow up CT in Jan 2012  . Raynaud disease   . Adenomatous polyp 03/2004  . Heart attack 2008    Remote inferior MI  . Diverticulosis   . CAD (coronary artery disease)     Remote inferior MI in 2008. Extensive stenting of the RCA, s/p repeat stenting of the RCA in Dec 2010 due to restenosis  . Syncope Jan 2013    NSVT noted on event monitor    ROS:   All systems reviewed and negative except as noted in the HPI.   Past Surgical History  Procedure Date  . Breast lumpectomy   . Cataract extraction   . Tympanostomy tube placement   . Angioplasty 9/09, 12/10  . Colonoscopy   . Coronary stent  placement 04/19/2009    EF 65%  . US echocardiography 04/29/2007    ef 60%  . Cardiovascular stress test 04/10/2009    EF 65%. MODERATE AREA OF MODERATE ISCHEMIA IN THE INFERIOR WALL     Family History  Problem Relation Age of Onset  . Coronary artery disease Father     & Mother  . Heart attack Father   . Gout Father   . Osteoarthritis Mother   . Heart disease Mother   . Hypertension Sister   . Heart disease Sister   . Breast cancer      Aunts  . Uterine cancer Sister   . Colon cancer Neg Hx      History   Social History  . Marital Status: Widowed    Spouse Name: N/A    Number of Children: 1  . Years of Education: N/A   Occupational History  . Retired Print production planner, [employer]    Social History Main Topics  . Smoking status: Former Smoker    Quit date: 01/11/2007  . Smokeless tobacco: Not on file  . Alcohol Use: No  . Drug Use: No  . Sexually Active: No   Other Topics Concern  . Not on file   Social History Narrative   Daily caffeine 2-3 cups per day     BP 118/52  Pulse 70  Wt 72.031 kg (158 lb 12.8 oz)  Physical Exam:  Well appearing 78 yo woman, NAD HEENT: Unremarkable Neck:  No JVD, no thyromegally Lungs:  Clear with no wheezes, rales, or rhonchi. Breath sounds are somewhat decreased throughout. HEART:  Regular rate rhythm, no murmurs, no rubs, no clicks. Heart sounds are decreased. Abd:  soft, positive bowel sounds, no organomegally, no rebound, no guarding Ext:  2 plus pulses, no edema, no cyanosis, no clubbing Skin:  No rashes no nodules Neuro:  CN II through XII intact, motor grossly intact  EKG Normal sinus rhythm  Assess/Plan:

## 2011-07-16 NOTE — Assessment & Plan Note (Signed)
It is unclear whether or not the patient's syncope is related to VT. She will undergo a period of watchful waiting.

## 2011-07-16 NOTE — Assessment & Plan Note (Signed)
Today we discussed the treatment options. Because she has coronary disease, her treatment options are limited. She is not inclined to be admitted to the hospital for sotalol or dofetilide. I recommended that we start low dose amiodarone. I plan to stop her calcium channel blocker. We also discussed catheter ablation of her tachycardia. Because of right ventricular outflow tract ventricular tachycardia is notorious for not being inducible at EP testing, I have recommended that we try medical therapy first.

## 2011-07-16 NOTE — Assessment & Plan Note (Signed)
Her blood pressure appears to be well controlled. There is a small chance that she will have more hypertension with discontinuation of amlodipine. We will address this when she follows up with [name] in several weeks.

## 2011-08-12 ENCOUNTER — Ambulatory Visit
Admission: RE | Admit: 2011-08-12 | Discharge: 2011-08-12 | Disposition: A | Discharge status: Home / Self Care | Payer: Medicare Other | Source: Ambulatory Visit | Attending: Internal Medicine | Admitting: Internal Medicine

## 2011-08-12 DIAGNOSIS — Z853 Personal history of malignant neoplasm of breast: Secondary | ICD-10-CM

## 2011-08-13 ENCOUNTER — Telehealth: Payer: Self-pay | Admitting: Internal Medicine

## 2011-08-13 NOTE — Telephone Encounter (Signed)
Spoke with pharmacy and she is taking both

## 2011-08-13 NOTE — Telephone Encounter (Signed)
Pt taking amiodarone prescribed by dr Ladona Ridgel and insurance shows pt received rx for  metoprolol in February but not filled by [facility] as they do not have this med on file for her , if she is taking both they show a possible drug interaction, pls advise

## 2011-08-19 ENCOUNTER — Encounter: Payer: Self-pay | Admitting: Internal Medicine

## 2011-08-19 ENCOUNTER — Ambulatory Visit (INDEPENDENT_AMBULATORY_CARE_PROVIDER_SITE_OTHER): Payer: Medicare Other | Admitting: Internal Medicine

## 2011-08-19 ENCOUNTER — Encounter: Payer: Self-pay | Admitting: *Deleted

## 2011-08-19 VITALS — BP 120/60 | HR 62 | Ht 59.0 in | Wt 158.4 lb

## 2011-08-19 DIAGNOSIS — R002 Palpitations: Secondary | ICD-10-CM

## 2011-08-19 DIAGNOSIS — I10 Essential (primary) hypertension: Secondary | ICD-10-CM

## 2011-08-19 DIAGNOSIS — I472 Ventricular tachycardia: Secondary | ICD-10-CM

## 2011-08-19 DIAGNOSIS — R55 Syncope and collapse: Secondary | ICD-10-CM

## 2011-08-19 DIAGNOSIS — E785 Hyperlipidemia, unspecified: Secondary | ICD-10-CM

## 2011-08-19 LAB — HEPATIC FUNCTION PANEL
ALT: 25 U/L (ref 0–35)
Albumin: 3.6 g/dL (ref 3.5–5.2)
Bilirubin, Direct: 0.2 mg/dL (ref 0.0–0.3)
Total Protein: 6.5 g/dL (ref 6.0–8.3)

## 2011-08-19 LAB — SEDIMENTATION RATE: Sed Rate: 21 mm/hr (ref 0–22)

## 2011-08-19 LAB — TSH: TSH: 1.66 u[IU]/mL (ref 0.35–5.50)

## 2011-08-19 NOTE — Progress Notes (Signed)
HPI Melinda Mora returns today for followup. She is a 78 year old woman with a history of ventricular tachycardia, syncope, preserved left ventricular function, and dementia. She also has hypertension. When I saw the patient last, we discussed multiple treatment options to suppress her ventricular tachycardia. The patient ultimately decided on a trial of amiodarone. She was not interested in catheter ablation and did not want to be hospitalized for the initiation of sotalol or dofetilide. In the interim, she has had no syncope and no symptomatic ventricular tachycardia. She complains that at times her legs feel heavy. She has very subtle muscle aches. She denies cough or skin changes or vision changes. Allergies  Allergen Reactions  . Codeine     REACTION: numbing  . Penicillins     REACTION: hives  . Shellfish Allergy      Current Outpatient Prescriptions  Medication Sig Dispense Refill  . albuterol (VENTOLIN HFA) 108 (90 BASE) MCG/ACT inhaler Inhale 2 puffs into the lungs every 6 (six) hours as needed.        Marland Kitchen alendronate (FOSAMAX) 70 MG tablet Take 70 mg by mouth every 7 (seven) days. Take with a full glass of water on an empty stomach.       . ALPRAZolam (XANAX) 0.25 MG tablet Take 0.25 mg by mouth 2 (two) times daily as needed.        Marland Kitchen amiodarone (PACERONE) 200 MG tablet Take 1 tablet (200 mg total) by mouth daily.  30 tablet  11  . aspirin 81 MG tablet Take 81 mg by mouth daily.      Marland Kitchen atorvastatin (LIPITOR) 40 MG tablet Take 1 tablet (40 mg total) by mouth daily.  90 tablet  3  . Calcium Lactate 750 MG TABS Take 1 tablet by mouth daily.        . cilostazol (PLETAL) 50 MG tablet Take 50 mg by mouth 2 (two) times daily.       . clopidogrel (PLAVIX) 75 MG tablet Take 1 tablet (75 mg total) by mouth daily.  90 tablet  3  . cycloSPORINE (RESTASIS) 0.05 % ophthalmic emulsion 1 drop 2 (two) times daily.        . fish oil-omega-3 fatty acids 1000 MG capsule Take 2 g by mouth daily. Taking  Mega Red      . memantine (NAMENDA) 5 MG tablet Take 10 mg by mouth 2 (two) times daily.       . metoprolol (LOPRESSOR) 50 MG tablet Take 1 tablet (50 mg total) by mouth 2 (two) times daily.  180 tablet  3  . mirtazapine (REMERON) 30 MG tablet Take 30 mg by mouth at bedtime.        . Multiple Vitamin (MULTIVITAMIN) tablet Take 1 tablet by mouth daily.        . nitroGLYCERIN (NITROSTAT) 0.4 MG SL tablet Place 0.4 mg under the tongue every 5 (five) minutes as needed.        . tiotropium (SPIRIVA) 18 MCG inhalation capsule Place 18 mcg into inhaler and inhale daily.        . valsartan-hydrochlorothiazide (DIOVAN-HCT) 160-12.5 MG per tablet Take 0.5 tablets by mouth daily.        Current Facility-Administered Medications  Medication Dose Route Frequency Provider Last Rate Last Dose  . 0.9 %  sodium chloride infusion  500 mL Intravenous Continuous Meryl Dare, MD,FACG         Past Medical History  Diagnosis Date  . COPD (chronic obstructive pulmonary disease)   .  Hyperlipidemia   . Hypertension   . Depression   . Osteoarthritis   . Allergic rhinitis   . Breast cancer     [id number]  . Lung nodule     Had follow up CT in Jan 2012  . Raynaud disease   . Adenomatous polyp 03/2004  . Heart attack 2008    Remote inferior MI  . Diverticulosis   . CAD (coronary artery disease)     Remote inferior MI in 2008. Extensive stenting of the RCA, s/p repeat stenting of the RCA in Dec 2010 due to restenosis  . Syncope Jan 2013    NSVT noted on event monitor    ROS:   All systems reviewed and negative except as noted in the HPI.   Past Surgical History  Procedure Date  . Breast lumpectomy   . Cataract extraction   . Tympanostomy tube placement   . Angioplasty 9/09, 12/10  . Colonoscopy   . Coronary stent placement 04/19/2009    EF 65%  . US echocardiography 04/29/2007    ef 60%  . Cardiovascular stress test 04/10/2009    EF 65%. MODERATE AREA OF MODERATE ISCHEMIA IN THE INFERIOR WALL      Family History  Problem Relation Age of Onset  . Coronary artery disease Father     & Mother  . Heart attack Father   . Gout Father   . Osteoarthritis Mother   . Heart disease Mother   . Hypertension Sister   . Heart disease Sister   . Breast cancer      Aunts  . Uterine cancer Sister   . Colon cancer Neg Hx      History   Social History  . Marital Status: Widowed    Spouse Name: N/A    Number of Children: 1  . Years of Education: N/A   Occupational History  . Retired Print production planner, [employer]    Social History Main Topics  . Smoking status: Former Smoker    Quit date: 01/11/2007  . Smokeless tobacco: Not on file  . Alcohol Use: No  . Drug Use: No  . Sexually Active: No   Other Topics Concern  . Not on file   Social History Narrative   Daily caffeine 2-3 cups per day     BP 120/60  Pulse 62  Ht 4\' 11"  (1.499 m)  Wt 71.85 kg (158 lb 6.4 oz)  BMI 31.99 kg/m2  Physical Exam:  Well appearing 78 year old woman, NAD HEENT: Unremarkable Neck:  No JVD, no thyromegally Lungs:  Clear with no wheezes, rales, or rhonchi. HEART:  Regular rate rhythm, no murmurs, no rubs, no clicks Abd:  soft, positive bowel sounds, no organomegally, no rebound, no guarding Ext:  2 plus pulses, no edema, no cyanosis, no clubbing Skin:  No rashes no nodules Neuro:  CN II through XII intact, motor grossly intact   DEVICE  Normal device function.  See PaceArt for details.    Assess/Plan:

## 2011-08-19 NOTE — Patient Instructions (Addendum)
Your physician recommends that you schedule a follow-up appointment in: 3 months with Dr Ladona Ridgel.  Your physician recommends that you return for lab work in: today.   Your physician has recommended you make the following change in your medication:  1) Reduce Amiodarone to 100mg  daily

## 2011-08-19 NOTE — Assessment & Plan Note (Addendum)
She will continue her current medical therapy with Lipitor and maintain a low-fat diet. As she is also on amiodarone, we'll plan to check a liver panel.

## 2011-08-19 NOTE — Assessment & Plan Note (Signed)
She has had no recurrent episodes. I have released the patient to go back to driving but asked her to drive only as little as possible.

## 2011-08-19 NOTE — Assessment & Plan Note (Signed)
Her blood pressure appears to be well-controlled. She will continue her current medical therapy and maintain a low-sodium diet. 

## 2011-08-19 NOTE — Assessment & Plan Note (Signed)
Her symptoms are well controlled. At this point the patient's amiodarone may be causing side effects. I have asked her to reduce her dose to 100 mg daily. We will plan to check labs today to make sure that her thyroid or her muscle enzymes are not abnormal.

## 2011-08-25 ENCOUNTER — Encounter (HOSPITAL_COMMUNITY)
Admission: RE | Admit: 2011-08-25 | Discharge: 2011-08-25 | Disposition: A | Discharge status: Home / Self Care | Payer: Self-pay | Source: Ambulatory Visit | Attending: Cardiology | Admitting: Cardiology

## 2011-08-25 DIAGNOSIS — E785 Hyperlipidemia, unspecified: Secondary | ICD-10-CM | POA: Insufficient documentation

## 2011-08-25 DIAGNOSIS — Z5189 Encounter for other specified aftercare: Secondary | ICD-10-CM | POA: Insufficient documentation

## 2011-08-25 DIAGNOSIS — I10 Essential (primary) hypertension: Secondary | ICD-10-CM | POA: Insufficient documentation

## 2011-08-25 DIAGNOSIS — Z9861 Coronary angioplasty status: Secondary | ICD-10-CM | POA: Insufficient documentation

## 2011-08-25 DIAGNOSIS — J449 Chronic obstructive pulmonary disease, unspecified: Secondary | ICD-10-CM | POA: Insufficient documentation

## 2011-08-25 DIAGNOSIS — I252 Old myocardial infarction: Secondary | ICD-10-CM | POA: Insufficient documentation

## 2011-08-25 DIAGNOSIS — I2510 Atherosclerotic heart disease of native coronary artery without angina pectoris: Secondary | ICD-10-CM | POA: Insufficient documentation

## 2011-08-25 DIAGNOSIS — J4489 Other specified chronic obstructive pulmonary disease: Secondary | ICD-10-CM | POA: Insufficient documentation

## 2011-08-27 ENCOUNTER — Encounter (HOSPITAL_COMMUNITY)
Admission: RE | Admit: 2011-08-27 | Discharge: 2011-08-27 | Disposition: A | Discharge status: Home / Self Care | Payer: Self-pay | Source: Ambulatory Visit | Attending: Cardiology | Admitting: Cardiology

## 2011-08-29 ENCOUNTER — Encounter (HOSPITAL_COMMUNITY)
Admission: RE | Admit: 2011-08-29 | Discharge: 2011-08-29 | Disposition: A | Discharge status: Home / Self Care | Payer: Self-pay | Source: Ambulatory Visit | Attending: Cardiology | Admitting: Cardiology

## 2011-09-01 ENCOUNTER — Encounter (HOSPITAL_COMMUNITY)
Admission: RE | Admit: 2011-09-01 | Discharge: 2011-09-01 | Disposition: A | Discharge status: Home / Self Care | Payer: Self-pay | Source: Ambulatory Visit | Attending: Cardiology | Admitting: Cardiology

## 2011-09-03 ENCOUNTER — Encounter (HOSPITAL_COMMUNITY)
Admission: RE | Admit: 2011-09-03 | Discharge: 2011-09-03 | Disposition: A | Discharge status: Home / Self Care | Payer: Self-pay | Source: Ambulatory Visit | Attending: Cardiology | Admitting: Cardiology

## 2011-09-05 ENCOUNTER — Encounter (HOSPITAL_COMMUNITY)
Admission: RE | Admit: 2011-09-05 | Discharge: 2011-09-05 | Disposition: A | Discharge status: Home / Self Care | Payer: Self-pay | Source: Ambulatory Visit | Attending: Cardiology | Admitting: Cardiology

## 2011-09-08 ENCOUNTER — Encounter (HOSPITAL_COMMUNITY): Payer: Self-pay

## 2011-09-10 ENCOUNTER — Encounter (HOSPITAL_COMMUNITY)
Admission: RE | Admit: 2011-09-10 | Discharge: 2011-09-10 | Disposition: A | Discharge status: Home / Self Care | Payer: Self-pay | Source: Ambulatory Visit | Attending: Cardiology | Admitting: Cardiology

## 2011-09-10 DIAGNOSIS — E785 Hyperlipidemia, unspecified: Secondary | ICD-10-CM | POA: Insufficient documentation

## 2011-09-10 DIAGNOSIS — J4489 Other specified chronic obstructive pulmonary disease: Secondary | ICD-10-CM | POA: Insufficient documentation

## 2011-09-10 DIAGNOSIS — Z5189 Encounter for other specified aftercare: Secondary | ICD-10-CM | POA: Insufficient documentation

## 2011-09-10 DIAGNOSIS — I2510 Atherosclerotic heart disease of native coronary artery without angina pectoris: Secondary | ICD-10-CM | POA: Insufficient documentation

## 2011-09-10 DIAGNOSIS — I10 Essential (primary) hypertension: Secondary | ICD-10-CM | POA: Insufficient documentation

## 2011-09-10 DIAGNOSIS — Z9861 Coronary angioplasty status: Secondary | ICD-10-CM | POA: Insufficient documentation

## 2011-09-10 DIAGNOSIS — I252 Old myocardial infarction: Secondary | ICD-10-CM | POA: Insufficient documentation

## 2011-09-10 DIAGNOSIS — J449 Chronic obstructive pulmonary disease, unspecified: Secondary | ICD-10-CM | POA: Insufficient documentation

## 2011-09-10 NOTE — Progress Notes (Signed)
Pt c/o dizziness after one lap on track during warm-up. Per pt symptoms resolved with rest. No further symptoms with exercise today. Pt states she experienced dizziness at home this morning while getting prepared to come to Cardiac Rehab today as well. Advised pt to notify MD of symptoms.

## 2011-09-12 ENCOUNTER — Encounter (HOSPITAL_COMMUNITY)
Admission: RE | Admit: 2011-09-12 | Discharge: 2011-09-12 | Disposition: A | Discharge status: Home / Self Care | Payer: Self-pay | Source: Ambulatory Visit | Attending: Cardiology | Admitting: Cardiology

## 2011-09-15 ENCOUNTER — Encounter (HOSPITAL_COMMUNITY): Payer: Self-pay

## 2011-09-17 ENCOUNTER — Encounter (HOSPITAL_COMMUNITY)
Admission: RE | Admit: 2011-09-17 | Discharge: 2011-09-17 | Disposition: A | Discharge status: Home / Self Care | Payer: Self-pay | Source: Ambulatory Visit | Attending: Cardiology | Admitting: Cardiology

## 2011-09-19 ENCOUNTER — Encounter (HOSPITAL_COMMUNITY)
Admission: RE | Admit: 2011-09-19 | Discharge: 2011-09-19 | Disposition: A | Discharge status: Home / Self Care | Payer: Self-pay | Source: Ambulatory Visit | Attending: Cardiology | Admitting: Cardiology

## 2011-09-22 ENCOUNTER — Encounter (HOSPITAL_COMMUNITY)
Admission: RE | Admit: 2011-09-22 | Discharge: 2011-09-22 | Disposition: A | Discharge status: Home / Self Care | Payer: Self-pay | Source: Ambulatory Visit | Attending: Cardiology | Admitting: Cardiology

## 2011-09-23 ENCOUNTER — Other Ambulatory Visit: Payer: Self-pay | Admitting: Obstetrics and Gynecology

## 2011-09-24 ENCOUNTER — Encounter (HOSPITAL_COMMUNITY)
Admission: RE | Admit: 2011-09-24 | Discharge: 2011-09-24 | Disposition: A | Discharge status: Home / Self Care | Payer: Self-pay | Source: Ambulatory Visit | Attending: Cardiology | Admitting: Cardiology

## 2011-09-26 ENCOUNTER — Encounter (HOSPITAL_COMMUNITY)
Admission: RE | Admit: 2011-09-26 | Discharge: 2011-09-26 | Disposition: A | Discharge status: Home / Self Care | Payer: Self-pay | Source: Ambulatory Visit | Attending: Cardiology | Admitting: Cardiology

## 2011-09-29 ENCOUNTER — Encounter (HOSPITAL_COMMUNITY)
Admission: RE | Admit: 2011-09-29 | Discharge: 2011-09-29 | Disposition: A | Discharge status: Home / Self Care | Payer: Self-pay | Source: Ambulatory Visit | Attending: Cardiology | Admitting: Cardiology

## 2011-10-01 ENCOUNTER — Encounter (HOSPITAL_COMMUNITY)
Admission: RE | Admit: 2011-10-01 | Discharge: 2011-10-01 | Disposition: A | Discharge status: Home / Self Care | Payer: Self-pay | Source: Ambulatory Visit | Attending: Cardiology | Admitting: Cardiology

## 2011-10-03 ENCOUNTER — Encounter (HOSPITAL_COMMUNITY): Payer: Self-pay

## 2011-10-03 ENCOUNTER — Encounter (HOSPITAL_BASED_OUTPATIENT_CLINIC_OR_DEPARTMENT_OTHER): Payer: Self-pay | Admitting: *Deleted

## 2011-10-03 NOTE — Progress Notes (Signed)
SPOKE W/ PT AND DAUGHTER, DOTTI. NPO AFTER MN. ARRIVES AT 0600.  PRE-OP ORDER PENDING FROM DR [facility]. OTHERWISE, ISTAT ORDERED. CURRENT EKG AND CXR IN EPIC . PER DAUGHTER, DR MCCOMB STATED THAT PT DID NOT NEED TO STOP PLAVIX OR ASA. REVIEWED CHART W/ DR DENNENY MDA, OK TO PROCEED BUT HAVE DR EWELL MDA TO REVIEW SINCE HE IS MDA DOS.

## 2011-10-06 ENCOUNTER — Encounter (HOSPITAL_COMMUNITY): Payer: Medicare Other

## 2011-10-07 NOTE — H&P (Signed)
  Patient name  Havana, Baldwin DICTATION#  [id number] CSN# [id number]  Juluis Mire, MD 10/07/2011 12:35 PM

## 2011-10-08 ENCOUNTER — Encounter (HOSPITAL_COMMUNITY)
Admission: RE | Admit: 2011-10-08 | Discharge: 2011-10-08 | Disposition: A | Discharge status: Home / Self Care | Payer: Self-pay | Source: Ambulatory Visit | Attending: Cardiology | Admitting: Cardiology

## 2011-10-08 NOTE — H&P (Signed)
Melinda Mora, DOBESH                ACCOUNT NO.:  [id number]  MEDICAL RECORD NO.:  [id number]  LOCATION:                               FACILITY:  [facility]  PHYSICIAN:  Juluis Mire, M.D.   DATE OF BIRTH:  1934-04-20  DATE OF ADMISSION:  10/10/2011 DATE OF DISCHARGE:                             HISTORY & PHYSICAL   DATE OF SURGERY:  Oct 10, 2011.  HISTORY OF PRESENT ILLNESS:  The patient is a 78 year old, gravida 1, para 1, postmenopausal patient who presents for hysteroscopy with D and C.  In relation to the present admission, the patient was referred in for postmenopausal bleeding.  She had a saline-infusion ultrasound reveals several large polyps.  Endometrial sampling revealed complex hyperplasia, no atypia.  Because of our concern, she now presents for hysteroscopy and complete D and C.  ALLERGIES:  In terms of allergies, she is allergic to PENICILLIN, CODEINE, and SHELLFISH.  MEDICATIONS:  She is on Diovan, hydrochlorothiazide for management of hypertension.  She takes a low-dose aspirin.  She is on mirtazapine. She is on metoprolol extended release.  She is on Spiriva inhaler.  She is on cilostazol 50 mg a day.  She is on Namenda.  She also is on atorvastatin.  She takes Plavix 75 mg a day.  She is also on Fosamax once a week.  Uses Zantac once a day.  She is also on Ventolin inhaler and use nitroglycerin as needed.  PAST MEDICAL HISTORY:  Significant and has a history of coronary artery disease, is on medications as noted.  She has had history of ventricular tachycardia.  She has problems with hyperlipidemia as well as hypertension.  She is being managed for osteoarthritis.  PAST SURGICAL HISTORY:  She has had a history of a lumpectomy.  She has a triple-negative breast cancer.  She does have radiation therapy and chemotherapy.  She has had bilateral cataracts and laser.  She has had left knee chondromalacia with __________ arterial tear.  She has had a right ear tube.   She has had a colonic polyp removed.  She has had, in terms of obstetrical history, one vaginal delivery.  SOCIAL HISTORY:  Past history of tobacco use.  No alcohol use.  FAMILY HISTORY:  Noncontributory.  REVIEW OF SYSTEMS:  Noncontributory.  PHYSICAL EXAMINATION:  VITAL SIGNS:  The patient is afebrile.  Stable vital signs. HEENT:  The patient is normocephalic.  Pupils equal, round, reactive to light and accommodation.  Extraocular movements were intact.  Sclerae and conjunctivae are clear.  Oropharynx clear. NECK:  No thyromegaly. BREASTS:  Not examined. LUNGS:  Clear. CARDIOVASCULAR SYSTEM:  Regular rate without murmurs or gallops. ABDOMINAL:  Benign. PELVIC:  Normal external genitalia.  Vaginal mucosa is clear.  Cervix unremarkable.  Uterus normal in size, shape, and contour.  Adnexa free of mass or tenderness. EXTREMITIES:  Trace edema. NEUROLOGIC:  Grossly within normal limits.  IMPRESSION: 1. Postmenopausal bleeding with evidence of complex hyperplasia. 2. Coronary artery disease. 3. Past history of breast cancer. 4. Hyperlipidemia. 5. Hypertension.  PLAN:  The patient to undergo hysteroscopy with D and C.  The risks of surgery have been discussed including the  risk of infection, the risk of hemorrhage that could require transfusion with the risk of AIDS or hepatitis, excessive bleeding could require hysterectomy, risk of uterine perforation lead to injury to adjacent organs requiring further exploratory surgery, risk of deep venous thrombosis and pulmonary embolus.  The patient expressed understanding potential risks and indications and complications.     Juluis Mire, M.D.     JSM/MEDQ  D:  10/07/2011  T:  10/08/2011  Job:  [id number]

## 2011-10-10 ENCOUNTER — Encounter (HOSPITAL_BASED_OUTPATIENT_CLINIC_OR_DEPARTMENT_OTHER)
Admission: RE | Disposition: A | Discharge status: Home / Self Care | Payer: Self-pay | Source: Ambulatory Visit | Attending: Obstetrics and Gynecology

## 2011-10-10 ENCOUNTER — Encounter (HOSPITAL_BASED_OUTPATIENT_CLINIC_OR_DEPARTMENT_OTHER): Payer: Self-pay | Admitting: Anesthesiology

## 2011-10-10 ENCOUNTER — Encounter (HOSPITAL_BASED_OUTPATIENT_CLINIC_OR_DEPARTMENT_OTHER): Payer: Self-pay | Admitting: *Deleted

## 2011-10-10 ENCOUNTER — Ambulatory Visit (HOSPITAL_BASED_OUTPATIENT_CLINIC_OR_DEPARTMENT_OTHER)
Admission: RE | Admit: 2011-10-10 | Discharge: 2011-10-10 | Disposition: A | Discharge status: Home / Self Care | Payer: Medicare Other | Source: Ambulatory Visit | Attending: Obstetrics and Gynecology | Admitting: Obstetrics and Gynecology

## 2011-10-10 ENCOUNTER — Encounter (HOSPITAL_COMMUNITY)
Admission: RE | Admit: 2011-10-10 | Discharge: 2011-10-10 | Disposition: A | Discharge status: Home / Self Care | Payer: Self-pay | Source: Ambulatory Visit | Attending: Cardiology | Admitting: Cardiology

## 2011-10-10 ENCOUNTER — Ambulatory Visit (HOSPITAL_BASED_OUTPATIENT_CLINIC_OR_DEPARTMENT_OTHER): Payer: Medicare Other | Admitting: Anesthesiology

## 2011-10-10 DIAGNOSIS — N8501 Benign endometrial hyperplasia: Secondary | ICD-10-CM

## 2011-10-10 DIAGNOSIS — I2510 Atherosclerotic heart disease of native coronary artery without angina pectoris: Secondary | ICD-10-CM | POA: Insufficient documentation

## 2011-10-10 DIAGNOSIS — I219 Acute myocardial infarction, unspecified: Secondary | ICD-10-CM

## 2011-10-10 DIAGNOSIS — I10 Essential (primary) hypertension: Secondary | ICD-10-CM | POA: Insufficient documentation

## 2011-10-10 DIAGNOSIS — J438 Other emphysema: Secondary | ICD-10-CM

## 2011-10-10 DIAGNOSIS — E785 Hyperlipidemia, unspecified: Secondary | ICD-10-CM

## 2011-10-10 DIAGNOSIS — I472 Ventricular tachycardia: Secondary | ICD-10-CM

## 2011-10-10 DIAGNOSIS — I252 Old myocardial infarction: Secondary | ICD-10-CM | POA: Insufficient documentation

## 2011-10-10 DIAGNOSIS — J984 Other disorders of lung: Secondary | ICD-10-CM

## 2011-10-10 DIAGNOSIS — J4489 Other specified chronic obstructive pulmonary disease: Secondary | ICD-10-CM | POA: Insufficient documentation

## 2011-10-10 DIAGNOSIS — R55 Syncope and collapse: Secondary | ICD-10-CM

## 2011-10-10 DIAGNOSIS — J449 Chronic obstructive pulmonary disease, unspecified: Secondary | ICD-10-CM

## 2011-10-10 DIAGNOSIS — J309 Allergic rhinitis, unspecified: Secondary | ICD-10-CM

## 2011-10-10 DIAGNOSIS — C55 Malignant neoplasm of uterus, part unspecified: Secondary | ICD-10-CM | POA: Insufficient documentation

## 2011-10-10 HISTORY — PX: DILATION AND CURETTAGE OF UTERUS: SHX78

## 2011-10-10 HISTORY — DX: Personal history of malignant neoplasm of breast: Z85.3

## 2011-10-10 HISTORY — DX: Dry eye syndrome of bilateral lacrimal glands: H04.123

## 2011-10-10 HISTORY — DX: Solitary pulmonary nodule: R91.1

## 2011-10-10 HISTORY — DX: Old myocardial infarction: I25.2

## 2011-10-10 HISTORY — DX: Anxiety disorder, unspecified: F41.9

## 2011-10-10 HISTORY — DX: Dyspnea, unspecified: R06.00

## 2011-10-10 HISTORY — DX: Emphysema, unspecified: J43.9

## 2011-10-10 HISTORY — DX: Presence of coronary angioplasty implant and graft: Z95.5

## 2011-10-10 HISTORY — DX: Occlusion and stenosis of unspecified carotid artery: I65.29

## 2011-10-10 LAB — CBC
Hemoglobin: 14.6 g/dL (ref 12.0–15.0)
MCH: 30.4 pg (ref 26.0–34.0)
Platelets: 214 10*3/uL (ref 150–400)
RBC: 4.81 MIL/uL (ref 3.87–5.11)
WBC: 6.1 10*3/uL (ref 4.0–10.5)

## 2011-10-10 LAB — BASIC METABOLIC PANEL
CO2: 21 mEq/L (ref 19–32)
Chloride: 105 mEq/L (ref 96–112)
Glucose, Bld: 121 mg/dL — ABNORMAL HIGH (ref 70–99)
Sodium: 137 mEq/L (ref 135–145)

## 2011-10-10 SURGERY — DILATATION & CURETTAGE/HYSTEROSCOPY WITH RESECTOCOPE
Anesthesia: General | Site: Uterus | Wound class: Clean Contaminated

## 2011-10-10 MED ORDER — LIDOCAINE-EPINEPHRINE 1 %-1:100000 IJ SOLN
INTRAMUSCULAR | Status: DC | PRN
  Administered 2011-10-10: 18 mL

## 2011-10-10 MED ORDER — GLYCINE 1.5 % IR SOLN
Status: DC | PRN
  Administered 2011-10-10: 3000 mL

## 2011-10-10 MED ORDER — FENTANYL CITRATE 0.05 MG/ML IJ SOLN: 25.0000 ug | INTRAMUSCULAR | Status: DC | PRN

## 2011-10-10 MED ORDER — PROPOFOL 10 MG/ML IV EMUL
INTRAVENOUS | Status: DC | PRN
  Administered 2011-10-10: 100 mg via INTRAVENOUS

## 2011-10-10 MED ORDER — ONDANSETRON HCL 4 MG/2ML IJ SOLN
INTRAMUSCULAR | Status: DC | PRN
  Administered 2011-10-10: 4 mg via INTRAVENOUS

## 2011-10-10 MED ORDER — CIPROFLOXACIN IN D5W 400 MG/200ML IV SOLN: 400.0000 mg | INTRAVENOUS | Status: DC

## 2011-10-10 MED ORDER — CLINDAMYCIN PHOSPHATE 900 MG/50ML IV SOLN
900.0000 mg | INTRAVENOUS | Status: AC
  Administered 2011-10-10: 900 mg via INTRAVENOUS

## 2011-10-10 MED ORDER — KETOROLAC TROMETHAMINE 30 MG/ML IJ SOLN
INTRAMUSCULAR | Status: DC | PRN
  Administered 2011-10-10: 15 mg via INTRAVENOUS

## 2011-10-10 MED ORDER — LACTATED RINGERS IV SOLN
INTRAVENOUS | Status: DC
  Administered 2011-10-10: 07:00:00 via INTRAVENOUS

## 2011-10-10 MED ORDER — FENTANYL CITRATE 0.05 MG/ML IJ SOLN
INTRAMUSCULAR | Status: DC | PRN
  Administered 2011-10-10: 25 ug via INTRAVENOUS
  Administered 2011-10-10: 50 ug via INTRAVENOUS

## 2011-10-10 MED ORDER — LACTATED RINGERS IV SOLN: INTRAVENOUS | Status: DC

## 2011-10-10 MED ORDER — LIDOCAINE HCL (CARDIAC) 20 MG/ML IV SOLN
INTRAVENOUS | Status: DC | PRN
  Administered 2011-10-10: 50 mg via INTRAVENOUS

## 2011-10-10 SURGICAL SUPPLY — 27 items
CANISTER SUCTION 2500CC (MISCELLANEOUS) ×2 IMPLANT
CATH ROBINSON RED A/P 16FR (CATHETERS) IMPLANT
CLOTH BEACON ORANGE TIMEOUT ST (SAFETY) ×2 IMPLANT
CORD ACTIVE DISPOSABLE (ELECTRODE) ×1
CORD ELECTRO ACTIVE DISP (ELECTRODE) ×1 IMPLANT
COVER TABLE BACK 60X90 (DRAPES) ×2 IMPLANT
DRAPE CAMERA CLOSED 9X96 (DRAPES) ×2 IMPLANT
DRAPE LG THREE QUARTER DISP (DRAPES) ×2 IMPLANT
DRESSING TELFA 8X3 (GAUZE/BANDAGES/DRESSINGS) ×2 IMPLANT
ELECT LOOP GYNE PRO 24FR (CUTTING LOOP) ×2
ELECT REM PT RETURN 9FT ADLT (ELECTROSURGICAL) ×2
ELECT VAPORTRODE GRVD BAR (ELECTRODE) IMPLANT
ELECTRODE LOOP GYNE PRO 24FR (CUTTING LOOP) ×1 IMPLANT
ELECTRODE REM PT RTRN 9FT ADLT (ELECTROSURGICAL) ×1 IMPLANT
GLOVE BIO SURGEON STRL SZ 6.5 (GLOVE) ×2 IMPLANT
GLOVE BIO SURGEON STRL SZ7 (GLOVE) ×4 IMPLANT
GLOVE ECLIPSE 6.0 STRL STRAW (GLOVE) ×2 IMPLANT
GOWN PREVENTION PLUS LG XLONG (DISPOSABLE) IMPLANT
LEGGING LITHOTOMY PAIR STRL (DRAPES) ×2 IMPLANT
NEEDLE SPNL 22GX3.5 QUINCKE BK (NEEDLE) ×2 IMPLANT
PACK BASIN DAY SURGERY FS (CUSTOM PROCEDURE TRAY) ×2 IMPLANT
PAD OB MATERNITY 4.3X12.25 (PERSONAL CARE ITEMS) ×2 IMPLANT
PAD PREP 24X48 CUFFED NSTRL (MISCELLANEOUS) ×2 IMPLANT
SYR CONTROL 10ML LL (SYRINGE) ×2 IMPLANT
TOWEL OR 17X24 6PK STRL BLUE (TOWEL DISPOSABLE) IMPLANT
TUBING HYDROFLEX HYSTEROSCOPY (TUBING) ×2 IMPLANT
WATER STERILE IRR 500ML POUR (IV SOLUTION) ×2 IMPLANT

## 2011-10-10 NOTE — Transfer of Care (Signed)
Immediate Anesthesia Transfer of Care Note  Patient: Melinda Mora  Procedure(s) Performed: Procedure(s) (LRB): DILATATION & CURETTAGE/HYSTEROSCOPY WITH RESECTOCOPE (N/A)  Patient Location: PACU  Anesthesia Type: General  Level of Consciousness: awake, oriented, sedated and patient cooperative  Airway & Oxygen Therapy: Patient Spontanous Breathing and Patient connected to face mask oxygen  Post-op Assessment: Report given to PACU RN and Post -op Vital signs reviewed and stable  Post vital signs: Reviewed and stable  Complications: No apparent anesthesia complications

## 2011-10-10 NOTE — Anesthesia Procedure Notes (Signed)
Procedure Name: LMA Insertion Date/Time: 10/10/2011 7:29 AM Performed by: Renella Cunas D Pre-anesthesia Checklist: Patient identified, Emergency Drugs available, Suction available and Patient being monitored Patient Re-evaluated:Patient Re-evaluated prior to inductionOxygen Delivery Method: Circle System Utilized Preoxygenation: Pre-oxygenation with 100% oxygen Intubation Type: IV induction Ventilation: Mask ventilation without difficulty LMA: LMA inserted LMA Size: 4.0 Number of attempts: 1 Airway Equipment and Method: bite block Placement Confirmation: positive ETCO2 Tube secured with: Tape Dental Injury: Teeth and Oropharynx as per pre-operative assessment

## 2011-10-10 NOTE — Discharge Instructions (Signed)
  Post Anesthesia Home Care Instructions  Activity: Get plenty of rest for the remainder of the day. A responsible adult should stay with you for 24 hours following the procedure.  For the next 24 hours, DO NOT: -Drive a car -Advertising copywriter -Drink alcoholic beverages -Take any medication unless instructed by your physician -Make any legal decisions or sign important papers.  Meals: Start with liquid foods such as gelatin or soup. Progress to regular foods as tolerated. Avoid greasy, spicy, heavy foods. If nausea and/or vomiting occur, drink only clear liquids until the nausea and/or vomiting subsides. Call your physician if vomiting continues.  Special Instructions/Symptoms: Your throat may feel dry or sore from the anesthesia or the breathing tube placed in your throat during surgery. If this causes discomfort, gargle with warm salt water. The discomfort should disappear within 24 hours.    D & C Home care Instructions:   Personal hygiene:  Used sanitary napkins for vaginal drainage not tampons. Shower or tub bathe the day after your procedure. No douching until bleeding stops. Always wipe from front to back after  Elimination.  Activity: Do not drive or operate any equipment today. The effects of the anesthesia are still present and drowsiness may result. Rest today, not necessarily flat bed rest, just take it easy. You may resume your normal activity in one to 2 days.  Sexual activity: No intercourse for one week or as indicated by your physician  Diet: Eat a light diet as desired this evening. You may resume a regular diet tomorrow.  Return to work: One to 2 days.  General Expectations of your surgery: Vaginal bleeding should be no heavier than a normal period. Spotting may continue up to 10 days. Mild cramps may continue for a couple of days. You may have a regular period in 2-6 weeks.  Unexpected observations call your doctor if these occur: persistent or heavy bleeding.  Severe abdominal cramping or pain. Elevation of temperature greater than 100F.  Call for an appointment in one week.    Patient's Signature_______________________________________________________  Nurse's Signature________________________________________________________  Post Anesthesia Home Care Instructions  Activity: Get plenty of rest for the remainder of the day. A responsible adult should stay with you for 24 hours following the procedure.  For the next 24 hours, DO NOT: -Drive a car -Advertising copywriter -Drink alcoholic beverages -Take any medication unless instructed by your physician -Make any legal decisions or sign important papers.  Meals: Start with liquid foods such as gelatin or soup. Progress to regular foods as tolerated. Avoid greasy, spicy, heavy foods. If nausea and/or vomiting occur, drink only clear liquids until the nausea and/or vomiting subsides. Call your physician if vomiting continues.  Special Instructions/Symptoms: Your throat may feel dry or sore from the anesthesia or the breathing tube placed in your throat during surgery. If this causes discomfort, gargle with warm salt water. The discomfort should disappear within 24 hours.

## 2011-10-10 NOTE — Anesthesia Preprocedure Evaluation (Signed)
Anesthesia Evaluation  Patient identified by MRN, date of birth, ID band Patient awake    Reviewed: Allergy & Precautions, H&P , NPO status , Patient's Chart, lab work & pertinent test results, reviewed documented beta blocker date and time   Airway Mallampati: II TM Distance: >3 FB Neck ROM: full    Dental No notable dental hx. (+) Teeth Intact and Dental Advisory Given   Pulmonary neg pulmonary ROS, shortness of breath and with exertion, COPD COPD inhaler,  breath sounds clear to auscultation  Pulmonary exam normal       Cardiovascular Exercise Tolerance: Good hypertension, Pt. on home beta blockers + CAD, + Past MI and + Cardiac Stents negative cardio ROS  + dysrhythmias Supra Ventricular Tachycardia Rhythm:regular Rate:Normal  Inf MI 01/2007.  Stent prox. To mid RCA. restenting 2010 due to stenosis.  Last cardiologist visit 4/13.   Neuro/Psych Anxiety Depression Raynaud's negative neurological ROS  negative psych ROS   GI/Hepatic negative GI ROS, Neg liver ROS,   Endo/Other  negative endocrine ROS  Renal/GU negative Renal ROS  negative genitourinary   Musculoskeletal   Abdominal   Peds  Hematology negative hematology ROS (+)   Anesthesia Other Findings   Reproductive/Obstetrics negative OB ROS                           Anesthesia Physical Anesthesia Plan  ASA: III  Anesthesia Plan: General   Post-op Pain Management:    Induction: Intravenous  Airway Management Planned: LMA  Additional Equipment:   Intra-op Plan:   Post-operative Plan:   Informed Consent: I have reviewed the patients History and Physical, chart, labs and discussed the procedure including the risks, benefits and alternatives for the proposed anesthesia with the patient or authorized representative who has indicated his/her understanding and acceptance.   Dental Advisory Given  Plan Discussed with: CRNA and  Surgeon  Anesthesia Plan Comments:         Anesthesia Quick Evaluation

## 2011-10-10 NOTE — Op Note (Signed)
Patient name  Melinda Mora, Melinda Mora DICTATION#  [id number] CSN# [id number]  [facility], MD 10/10/2011 7:56 AM

## 2011-10-10 NOTE — Brief Op Note (Signed)
10/10/2011  7:53 AM  PATIENT:  Melinda Mora  78 y.o. female  PRE-OPERATIVE DIAGNOSIS:  ENDOMETRIAL HYPERPLASIA  POST-OPERATIVE DIAGNOSIS:  ENDOMETRIAL HYPERPLASIA  PROCEDURE:  Procedure(s) (LRB): DILATATION & CURETTAGE/HYSTEROSCOPY WITH RESECTOCOPE (N/A)  SURGEON:  Surgeon(s) and Role:    * Juluis Mire, MD - Primary  PHYSICIAN ASSISTANT:   ASSISTANTS: none   ANESTHESIA:   local and epidural  EBL:  Total I/O In: 100 [I.V.:100] Out: -   BLOOD ADMINISTERED:none  DRAINS: none   LOCAL MEDICATIONS USED:  XYLOCAINE   SPECIMEN:  Source of Specimen:  endometrial resections and curretting  DISPOSITION OF SPECIMEN:  PATHOLOGY  COUNTS:  YES  TOURNIQUET:  * No tourniquets in log *  DICTATION: .Other Dictation: Dictation Number [id number]  PLAN OF CARE: Discharge to home after PACU  PATIENT DISPOSITION:  PACU - hemodynamically stable.   Delay start of Pharmacological VTE agent (>24hrs) due to surgical blood loss or risk of bleeding: no

## 2011-10-10 NOTE — Anesthesia Postprocedure Evaluation (Signed)
  Anesthesia Post-op Note  Patient: Melinda Mora  Procedure(s) Performed: Procedure(s) (LRB): DILATATION & CURETTAGE/HYSTEROSCOPY WITH RESECTOCOPE (N/A)  Patient Location: PACU  Anesthesia Type: General  Level of Consciousness: awake and alert   Airway and Oxygen Therapy: Patient Spontanous Breathing  Post-op Pain: mild  Post-op Assessment: Post-op Vital signs reviewed, Patient's Cardiovascular Status Stable, Respiratory Function Stable, Patent Airway and No signs of Nausea or vomiting  Post-op Vital Signs: stable  Complications: No apparent anesthesia complications

## 2011-10-10 NOTE — H&P (Signed)
  History and physical exam unchanged 

## 2011-10-11 NOTE — Op Note (Signed)
Melinda Mora, Melinda Mora                ACCOUNT NO.:  [id number]  MEDICAL RECORD NO.:  [id number]  LOCATION:                               FACILITY:  [facility]  PHYSICIAN:  Juluis Mire, M.D.   DATE OF BIRTH:  02-08-1934  DATE OF PROCEDURE:  10/10/2011 DATE OF DISCHARGE:                              OPERATIVE REPORT   PREOPERATIVE DIAGNOSIS:  Complex endometrial hyperplasia, rule out endometrial cancer.  POSTOPERATIVE DIAGNOSIS:  Complex endometrial hyperplasia, rule out endometrial cancer.  OPERATIVE PROCEDURE:  Paracervical block.  Cervical dilatation. Hysteroscopy with resection of endometrium along with curettings.  SURGEON:  Juluis Mire, M.D.  ANESTHESIA:  Paracervical block as well as general.  ESTIMATED BLOOD LOSS:  Minimal.  PACKS AND DRAINS:  None.  INTRAOPERATIVE BLOOD PLACED:  None.  COMPLICATIONS:  None.  INDICATION:  As dictated in history and physical.  PROCEDURE IN DETAIL:  The patient was taken to the OR, placed in supine position.  After satisfactory level of general anesthesia was obtained, the patient was placed in dorsal position using the Allen stirrups. Perineum and vagina were prepped out with antiseptic solution, and draped in sterile field.  Spec was placed in vaginal vault.  The cervix was grasped single-tooth tenaculum.  Paracervical block was instituted using 1% Xylocaine with epinephrine.  Approximately 18 mL.  Uterus sounded to approximately 8 cm.  Cervix serially dilated to a size 31 Pratt dilator.  The resectoscope was introduced.  Intrauterine cavity was distended using glycine.  Visualization revealed several outgrowths from the endometrium on the posterior and anterior wall.  These were resected and sent for pathological review.  All abnormalities were completely resected.  We then obtained endometrial curettings.  Total deficit was 100 mL.  There was no signs of perforation or active bleeding at the end of the case.  Speculum and  single-tooth tenaculum removed.  The patient taken out of dorsal lithotomy position.  Once alert and extubated, transferred to the recovery in good condition.  Sponge, instrument, and needle count was correct by circulating nurse.     Juluis Mire, M.D.     JSM/MEDQ  D:  10/10/2011  T:  10/11/2011  Job:  [id number]

## 2011-10-13 ENCOUNTER — Encounter (HOSPITAL_COMMUNITY): Payer: Medicare Other

## 2011-10-13 DIAGNOSIS — I10 Essential (primary) hypertension: Secondary | ICD-10-CM | POA: Insufficient documentation

## 2011-10-13 DIAGNOSIS — J4489 Other specified chronic obstructive pulmonary disease: Secondary | ICD-10-CM | POA: Insufficient documentation

## 2011-10-13 DIAGNOSIS — Z5189 Encounter for other specified aftercare: Secondary | ICD-10-CM | POA: Insufficient documentation

## 2011-10-13 DIAGNOSIS — Z9861 Coronary angioplasty status: Secondary | ICD-10-CM | POA: Insufficient documentation

## 2011-10-13 DIAGNOSIS — J449 Chronic obstructive pulmonary disease, unspecified: Secondary | ICD-10-CM | POA: Insufficient documentation

## 2011-10-13 DIAGNOSIS — I2510 Atherosclerotic heart disease of native coronary artery without angina pectoris: Secondary | ICD-10-CM | POA: Insufficient documentation

## 2011-10-13 DIAGNOSIS — E785 Hyperlipidemia, unspecified: Secondary | ICD-10-CM | POA: Insufficient documentation

## 2011-10-13 DIAGNOSIS — I252 Old myocardial infarction: Secondary | ICD-10-CM | POA: Insufficient documentation

## 2011-10-15 ENCOUNTER — Encounter (HOSPITAL_COMMUNITY): Payer: Medicare Other

## 2011-10-17 ENCOUNTER — Encounter (HOSPITAL_COMMUNITY): Payer: Medicare Other

## 2011-10-18 ENCOUNTER — Inpatient Hospital Stay (HOSPITAL_COMMUNITY)
Admission: AD | Admit: 2011-10-18 | Discharge: 2011-10-19 | Disposition: A | Discharge status: Home / Self Care | Payer: Medicare Other | Source: Ambulatory Visit | Attending: Obstetrics and Gynecology | Admitting: Obstetrics and Gynecology

## 2011-10-18 ENCOUNTER — Encounter (HOSPITAL_COMMUNITY): Payer: Self-pay | Admitting: *Deleted

## 2011-10-18 DIAGNOSIS — Y838 Other surgical procedures as the cause of abnormal reaction of the patient, or of later complication, without mention of misadventure at the time of the procedure: Secondary | ICD-10-CM | POA: Insufficient documentation

## 2011-10-18 DIAGNOSIS — IMO0002 Reserved for concepts with insufficient information to code with codable children: Secondary | ICD-10-CM | POA: Insufficient documentation

## 2011-10-18 LAB — CBC
HCT: 37.4 % (ref 36.0–46.0)
Hemoglobin: 11.6 g/dL — ABNORMAL LOW (ref 12.0–15.0)
MCHC: 31 g/dL (ref 30.0–36.0)
RBC: 3.93 MIL/uL (ref 3.87–5.11)
WBC: 8.7 10*3/uL (ref 4.0–10.5)

## 2011-10-18 NOTE — MAU Note (Signed)
Pt states, " I had a hysteroscopy on May 31 by Dr Arelia Sneddon. He removed fibroids. I never stopped bleeding, but today I had two episodes of vaginal bleeding. It came in gushes with clots the size of quarters after I was sitting for a while. When I pass the clots, I have some cramping in my lower abdomen."

## 2011-10-19 ENCOUNTER — Inpatient Hospital Stay (HOSPITAL_COMMUNITY): Payer: Medicare Other

## 2011-10-19 DIAGNOSIS — IMO0002 Reserved for concepts with insufficient information to code with codable children: Secondary | ICD-10-CM

## 2011-10-19 LAB — COMPREHENSIVE METABOLIC PANEL
ALT: 23 U/L (ref 0–35)
Alkaline Phosphatase: 109 U/L (ref 39–117)
BUN: 18 mg/dL (ref 6–23)
CO2: 26 mEq/L (ref 19–32)
Chloride: 103 mEq/L (ref 96–112)
GFR calc Af Amer: 50 mL/min — ABNORMAL LOW (ref >90)
GFR calc non Af Amer: 43 mL/min — ABNORMAL LOW (ref >90)
Glucose, Bld: 97 mg/dL (ref 70–99)
Potassium: 3.9 mEq/L (ref 3.5–5.1)
Sodium: 140 mEq/L (ref 135–145)
Total Bilirubin: 0.3 mg/dL (ref 0.3–1.2)

## 2011-10-19 MED ORDER — INTEGRA F 125-1 MG PO CAPS: 1.0000 | ORAL_CAPSULE | Freq: Every day | ORAL | Status: DC

## 2011-10-19 NOTE — Discharge Instructions (Signed)
Postsurgical Bleeding You have developed bleeding after surgery. A little bleeding following surgery may be normal. Sometimes a second surgery to stop the bleeding is needed.  SYMPTOMS  The problems of bleeding following surgery depend on the amount and location of the bleeding:  Sometimes there is a little bleeding from a wound following surgery. This is extremely common. It is not usually problem.   Some surgeries will always have a small amount of bleeding following the surgery. An example of this would be a D & C (dilatation and curettage). This is a procedure where the inside of the uterus is scraped out. Because the surface is raw inside the uterus after the procedure, there is almost always some bleeding or oozing.   Occasionally there may be a small vessel that either breaks loose from a suture (stitch) or a vessel that was not bleeding during the procedure because of spasm in the vessel. Then when the spasm goes away after surgery, bleeding begins.   Bleeding into your brain after brain surgery happens occasionally and is very dangerous.  DIAGNOSIS  Your caregiver will often know what is wrong by examining you.  TREATMENT   The treatment of bleeding problems following surgery will depend on the amount and the location.   Your caregiver will decide if it is safe to watch this. If the problem does not get better, some additional surgery may be needed.   Worrisome bleeding may require faster action and more surgery. It may be necessary to take you immediately back to surgery if there is a sudden loss of blood pressure following surgery. There may not be time to consult with family, or even the patient, if the problems are sudden and severe.   A blood transfusion may be needed.  HOME CARE INSTRUCTIONS If you have questions about your care, discuss them with your caregiver. Make sure all of your questions are answered. SEEK MEDICAL CARE IF: Bleeding is increased, or there is increased  pain, swelling, heat or redness in the wound or you develop an unexplained temperature. Document Released: 07/18/2004 Document Revised: 04/17/2011 Document Reviewed: 03/25/2007 [facility] Patient Information 2012 [place], [place].Postsurgical Bleeding You have developed bleeding after surgery. A little bleeding following surgery may be normal. Sometimes a second surgery to stop the bleeding is needed.  SYMPTOMS  The problems of bleeding following surgery depend on the amount and location of the bleeding:  Sometimes there is a little bleeding from a wound following surgery. This is extremely common. It is not usually problem.   Some surgeries will always have a small amount of bleeding following the surgery. An example of this would be a D & C (dilatation and curettage). This is a procedure where the inside of the uterus is scraped out. Because the surface is raw inside the uterus after the procedure, there is almost always some bleeding or oozing.   Occasionally there may be a small vessel that either breaks loose from a suture (stitch) or a vessel that was not bleeding during the procedure because of spasm in the vessel. Then when the spasm goes away after surgery, bleeding begins.   Bleeding into your brain after brain surgery happens occasionally and is very dangerous.  DIAGNOSIS  Your caregiver will often know what is wrong by examining you.  TREATMENT   The treatment of bleeding problems following surgery will depend on the amount and the location.   Your caregiver will decide if it is safe to watch this. If the problem does  not get better, some additional surgery may be needed.   Worrisome bleeding may require faster action and more surgery. It may be necessary to take you immediately back to surgery if there is a sudden loss of blood pressure following surgery. There may not be time to consult with family, or even the patient, if the problems are sudden and severe.   A blood transfusion  may be needed.  HOME CARE INSTRUCTIONS If you have questions about your care, discuss them with your caregiver. Make sure all of your questions are answered. SEEK MEDICAL CARE IF: Bleeding is increased, or there is increased pain, swelling, heat or redness in the wound or you develop an unexplained temperature. Document Released: 07/18/2004 Document Revised: 04/17/2011 Document Reviewed: 03/25/2007 [facility] Patient Information 2012 [place], [place].

## 2011-10-19 NOTE — [facility] Provider Note (Signed)
Melinda Mora y.o.[id number]. Chief Complaint  Patient presents with  . Post-op Problem     First Provider Initiated Contact with Patient 10/19/11 0038      SUBJECTIVE  HPI: HPI: Melinda Mora is a 78 y.o. year old [id number] female who presents to [facility] reporting continuous moderate bleeding w/ occasional gushes of blood and passage of small clots since hysteroscopy and D&C 10/10/11. He also reports dizziness, but no syncope. She denies fever, chills, abd pain or passage of purulent discharge. She take Plavix and Aspirin for Hx of MI. He denies any  Other increase in bleeding or bruising over baseline.    Past Medical History  Diagnosis Date  . Hyperlipidemia   . Hypertension   . Depression   . Osteoarthritis   . Allergic rhinitis   . Raynaud disease   . Adenomatous polyp 03/2004  . Diverticulosis   . Syncope Jan 2013    NSVT noted on event monitor  . COPD (chronic obstructive pulmonary disease) with emphysema   . Pulmonary nodule, right x2  middle and lower lobes--  stable per follow-ip ct  jan 2012  . S/P primary angioplasty with coronary stent   . History of acute inferior wall myocardial infarction SEPT 2008      S/P STENTING PROXIMAL TO MID RCA  . Carotid stenosis, asymptomatic BILATERAL ICA --  0-39%  PER  DUPLEX 05-22-2011  . Endometrial hyperplasia   . History of breast cancer 2011--  ONCOLOGIST- DR Camelia Eng    S/P LEFT PARTIAL MASTECTOMY AND RADIATION---  NO RECURRENCE  . Dyspnea   . Anxiety   . CAD (coronary artery disease) CARDIOLOGIST-- DR Lewayne Bunting--- LAST VISIT 08-16-2011  NOTE IN EPIC    Remote inferior MI in 2008. Extensive stenting of the RCA, s/p repeat stenting of the RCA in Dec 2010 due to restenosis  . Dry eyes, bilateral    Past Surgical History  Procedure Date  . Tympanostomy tube placement   . Colonoscopy JULY 2013  . US echocardiography 05-22-2011    LVSF NORMAL/ EF 60-65%  . Left partial mastectomy w/ bx axillary lymph node  09-04-2009   INVASIVE MAMMARY CARCINOMA/  STAGE T1b NO  . Coronary angioplasty with stent placement 04-19-2009  DR PETER [place]    SINGLE VESSEL OBSTRUCTIVE DISEASE/  STENTING OF IN-STENT RESTENOSIS MID RCA/ NORMAL LVF  . Coronary angioplasty with stent placement SEPT 2008--  [facility]    STENTING PROXIMAL TO MID RCA  . Cardiovascular stress test 05-28-2011    LOW RISK STUDY  . Bilateral breast lumpectomy BEFORE 2008  . Dilation and curettage of uterus   . Cataract extraction w/ intraocular lens  implant, bilateral    History   Social History  . Marital Status: Widowed    Spouse Name: N/A    Number of Children: 1  . Years of Education: N/A   Occupational History  . Retired Print production planner, [employer]    Social History Main Topics  . Smoking status: Former Smoker -- 2.0 packs/day for 50 years    Types: Cigarettes    Quit date: 01/11/2007  . Smokeless tobacco: Never Used  . Alcohol Use: 3.6 oz/week    6 Glasses of wine per week  . Drug Use: No  . Sexually Active: Not on file   Other Topics Concern  . Not on file   Social History Narrative   Daily caffeine 2-3 cups per day   No current facility-administered medications on file  prior to encounter.   Current Outpatient Prescriptions on File Prior to Encounter  Medication Sig Dispense Refill  . albuterol (VENTOLIN HFA) 108 (90 BASE) MCG/ACT inhaler Inhale 2 puffs into the lungs every 6 (six) hours as needed.       Marland Kitchen alendronate (FOSAMAX) 70 MG tablet Take 70 mg by mouth every 7 (seven) days. Take with a full glass of water on an empty stomach.      . ALPRAZolam (XANAX) 0.25 MG tablet Take 0.25 mg by mouth 2 (two) times daily as needed.       Marland Kitchen amiodarone (PACERONE) 200 MG tablet Take 100 mg by mouth every morning.   30 tablet  11  . aspirin 81 MG tablet Take 81 mg by mouth daily.      Marland Kitchen atorvastatin (LIPITOR) 40 MG tablet Take 1 tablet (40 mg total) by mouth daily.  90 tablet  3  . Calcium Lactate 750 MG TABS Take  1 tablet by mouth daily.       . cilostazol (PLETAL) 50 MG tablet Take 50 mg by mouth 2 (two) times daily.       . clopidogrel (PLAVIX) 75 MG tablet Take 1 tablet (75 mg total) by mouth daily.  90 tablet  3  . cycloSPORINE (RESTASIS) 0.05 % ophthalmic emulsion Place 1 drop into both eyes 2 (two) times daily.       . fish oil-omega-3 fatty acids 1000 MG capsule Take 2 g by mouth daily. Taking Mega Red      . memantine (NAMENDA) 5 MG tablet Take 10 mg by mouth 2 (two) times daily.       . metoprolol (LOPRESSOR) 50 MG tablet Take 1 tablet (50 mg total) by mouth 2 (two) times daily.  180 tablet  3  . mirtazapine (REMERON) 30 MG tablet Take 30 mg by mouth at bedtime.       . Multiple Vitamin (MULTIVITAMIN) tablet Take 1 tablet by mouth daily.       . nitroGLYCERIN (NITROSTAT) 0.4 MG SL tablet Place 0.4 mg under the tongue every 5 (five) minutes as needed.       . tiotropium (SPIRIVA) 18 MCG inhalation capsule Place 18 mcg into inhaler and inhale every morning.       . valsartan-hydrochlorothiazide (DIOVAN-HCT) 160-12.5 MG per tablet Take 0.5 tablets by mouth daily.        Allergies  Allergen Reactions  . Codeine Hives       . Penicillins Hives  . Shellfish Allergy Other (See Comments)    Absent reflexes    ROS: Pertinent items in HPI  OBJECTIVE Blood pressure 145/61, pulse 73, temperature 97 F (36.1 C), temperature source Oral, resp. rate 16, height 4\' 11"  (1.499 m), weight 71.215 kg (157 lb).  GENERAL: Well-developed, well-nourished female in no acute distress.  HEENT: Normocephalic HEART: normal rate RESP: normal effort ABDOMEN: Soft, nontender EXTREMITIES: Nontender, no edema NEURO: Alert and oriented SPECULUM EXAM: NEFG, small-moderate amount of dark red blood noted in vault w/ oozing from os, cervix clean. No injury to vagina or cervix. Vaginal mucosa pale, non-rugated, atrophic.  BIMANUAL: cervix closed; uterus non-tender, unable to assess size due to body habitus; no adnexal  tenderness or masses  LAB RESULTS Results for orders placed during the hospital encounter of 10/18/11 (from the past 24 hour(s))  COMPREHENSIVE METABOLIC PANEL     Status: Abnormal   Collection Time   10/18/11 11:00 PM      Component Value Range  Sodium 140  135 - 145 (mEq/L)   Potassium 3.9  3.5 - 5.1 (mEq/L)   Chloride 103  96 - 112 (mEq/L)   CO2 26  19 - 32 (mEq/L)   Glucose, Bld 97  70 - 99 (mg/dL)   BUN 18  6 - 23 (mg/dL)   Creatinine, Ser 1.61 (*) 0.50 - 1.10 (mg/dL)   Calcium 9.2  8.4 - 09.6 (mg/dL)   Total Protein 6.0  6.0 - 8.3 (g/dL)   Albumin 3.2 (*) 3.5 - 5.2 (g/dL)   AST 26  0 - 37 (U/L)   ALT 23  0 - 35 (U/L)   Alkaline Phosphatase 109  39 - 117 (U/L)   Total Bilirubin 0.3  0.3 - 1.2 (mg/dL)   GFR calc non Af Amer 43 (*) >90 (mL/min)   GFR calc Af Amer 50 (*) >90 (mL/min)  CBC     Status: Abnormal   Collection Time   10/18/11 11:15 PM      Component Value Range   WBC 8.7  4.0 - 10.5 (K/uL)   RBC 3.93  3.87 - 5.11 (MIL/uL)   Hemoglobin 11.6 (*) 12.0 - 15.0 (g/dL)   HCT 04.5  40.9 - 81.1 (%)   MCV 95.2  78.0 - 100.0 (fL)   MCH 29.5  26.0 - 34.0 (pg)   MCHC 31.0  30.0 - 36.0 (g/dL)   RDW 91.4  78.2 - 95.6 (%)   Platelets 226  150 - 400 (K/uL)   IMAGING US Transvaginal Non-ob  10/19/2011  *RADIOLOGY REPORT*  Clinical Data: Bleeding, recent hysteroscopy and D&C.  TRANSABDOMINAL AND TRANSVAGINAL ULTRASOUND OF PELVIS Technique:  Both transabdominal and transvaginal ultrasound examinations of the pelvis were performed. Transabdominal technique was performed for global imaging of the pelvis including uterus, ovaries, adnexal regions, and pelvic cul-de-sac.  Comparison: None   It was necessary to proceed with endovaginal exam following the transabdominal exam to visualize the endometrium and adnexa.  Findings:  Uterus: Normal in size and appearance, measuring 6.1 x 3.5 x 4.7 cm.  Endometrium: Thickened and heterogeneous in echogenicity, measuring 19.7 mm.  Right ovary:  Not  visualized.  There is a simple appearing right adnexal cyst measuring 2.0 x 1.8 x 1.6 cm.  Left ovary: Not visualized  Other findings: No free fluid  IMPRESSION: Thickened/heterogeneous appearance to the endometrium is nonspecific. Given the recent history of hysteroscopy, may in part represent blood clot with underlying hyperplasia or mass. Recommend correlation with pathology results from recent procedure.  Original Report Authenticated By: Waneta Martins, M.D.   US Pelvis Complete  10/19/2011  *RADIOLOGY REPORT*  Clinical Data: Bleeding, recent hysteroscopy and D&C.  TRANSABDOMINAL AND TRANSVAGINAL ULTRASOUND OF PELVIS Technique:  Both transabdominal and transvaginal ultrasound examinations of the pelvis were performed. Transabdominal technique was performed for global imaging of the pelvis including uterus, ovaries, adnexal regions, and pelvic cul-de-sac.  Comparison: None   It was necessary to proceed with endovaginal exam following the transabdominal exam to visualize the endometrium and adnexa.  Findings:  Uterus: Normal in size and appearance, measuring 6.1 x 3.5 x 4.7 cm.  Endometrium: Thickened and heterogeneous in echogenicity, measuring 19.7 mm.  Right ovary:  Not visualized.  There is a simple appearing right adnexal cyst measuring 2.0 x 1.8 x 1.6 cm.  Left ovary: Not visualized  Other findings: No free fluid  IMPRESSION: Thickened/heterogeneous appearance to the endometrium is nonspecific. Given the recent history of hysteroscopy, may in part represent blood clot with underlying  hyperplasia or mass. Recommend correlation with pathology results from recent procedure.  Original Report Authenticated By: Waneta Martins, M.D.   ASSESSMENT 1. Post-op bleeding possibly due to Plavix and Aspirin use.   PLAN D/C home per consult w/ Dr. Henderson Cloud Bleeding precautions Change positions slowly. Increase fluids. Follow-up Information    Follow up with Juluis Mire, MD. Call on 10/20/2011.    Contact information:   [facility] [address] [place] [address] [phone]       Follow up with [facility]. (If symptoms worsen)    Contact information:   [address] [place] [address] [phone]        Medication List  As of 10/19/2011  3:37 AM   START taking these medications         INTEGRA F 125-1 MG Caps   Take 1 tablet by mouth daily.         CONTINUE taking these medications         ALPRAZolam 0.25 MG tablet   Commonly known as: XANAX      amiodarone 200 MG tablet   Commonly known as: PACERONE      aspirin 81 MG tablet      atorvastatin 40 MG tablet   Commonly known as: LIPITOR   Take 1 tablet (40 mg total) by mouth daily.      Calcium Lactate 750 MG Tabs      cilostazol 50 MG tablet   Commonly known as: PLETAL      clopidogrel 75 MG tablet   Commonly known as: PLAVIX   Take 1 tablet (75 mg total) by mouth daily.      cycloSPORINE 0.05 % ophthalmic emulsion   Commonly known as: RESTASIS      fish oil-omega-3 fatty acids 1000 MG capsule      FOSAMAX 70 MG tablet   Generic drug: alendronate      metoprolol 50 MG tablet   Commonly known as: LOPRESSOR   Take 1 tablet (50 mg total) by mouth 2 (two) times daily.      mirtazapine 30 MG tablet   Commonly known as: REMERON      multivitamin tablet      NAMENDA 5 MG tablet   Generic drug: memantine      nitroGLYCERIN 0.4 MG SL tablet   Commonly known as: NITROSTAT      tiotropium 18 MCG inhalation capsule   Commonly known as: SPIRIVA      valsartan-hydrochlorothiazide 160-12.5 MG per tablet   Commonly known as: DIOVAN-HCT      VENTOLIN HFA 108 (90 BASE) MCG/ACT inhaler   Generic drug: albuterol          Where to get your medications    These are the prescriptions that you need to pick up.   You may get these medications from any pharmacy.         INTEGRA F 125-1 MG Caps           Melinda Mora,  Melinda Mora 10/19/2011 2:29 AM

## 2011-10-20 ENCOUNTER — Encounter (HOSPITAL_COMMUNITY): Payer: Medicare Other

## 2011-10-22 ENCOUNTER — Encounter (HOSPITAL_COMMUNITY)
Admission: RE | Admit: 2011-10-22 | Discharge: 2011-10-22 | Disposition: A | Discharge status: Home / Self Care | Payer: Medicare Other | Source: Ambulatory Visit | Attending: Cardiology | Admitting: Cardiology

## 2011-10-23 ENCOUNTER — Encounter (INDEPENDENT_AMBULATORY_CARE_PROVIDER_SITE_OTHER): Payer: Self-pay | Admitting: General Surgery

## 2011-10-23 ENCOUNTER — Ambulatory Visit (INDEPENDENT_AMBULATORY_CARE_PROVIDER_SITE_OTHER): Payer: Medicare Other | Admitting: General Surgery

## 2011-10-23 VITALS — BP 124/78 | HR 72 | Temp 97.8°F | Resp 14 | Ht 59.0 in | Wt 153.4 lb

## 2011-10-23 DIAGNOSIS — C50919 Malignant neoplasm of unspecified site of unspecified female breast: Secondary | ICD-10-CM

## 2011-10-23 NOTE — Progress Notes (Signed)
Patient ID: Melinda Mora, female   DOB: 07/14/1933, 77 y.o.   MRN: [id number]  Chief Complaint  Patient presents with  . Breast Cancer Long Term Follow Up    HPI Melinda Mora is a 78 y.o. female.  She returns for long-term followup regarding her left breast cancer.  This woman has numerous comorbidities, including coronary artery disease, myocardial infarction 6 years ago, moderately severe COPD, hypertension. She has recently been diagnosed with uterine cancer and is going to see the [facility] GYN oncologist. She is generally followed by Richardean Chimera for GYN and [name] for primary care and Peter [place] for cardiology.  On September 04, 2009 she underwent left partial mastectomy sentinel node biopsy for a triple negative, T1 B., N0 tumor. She had postop radiation therapy and is also followed by Dr. Darnelle Catalan with surveillance.  She has no new complaints about her breast it she thinks things have remained stable. Bilateral mammograms performed on August 12, 2011 are normal, category 2. HPI  Past Medical History  Diagnosis Date  . Hyperlipidemia   . Hypertension   . Depression   . Osteoarthritis   . Allergic rhinitis   . Raynaud disease   . Adenomatous polyp 03/2004  . Diverticulosis   . Syncope Jan 2013    NSVT noted on event monitor  . COPD (chronic obstructive pulmonary disease) with emphysema   . Pulmonary nodule, right x2  middle and lower lobes--  stable per follow-ip ct  jan 2012  . S/P primary angioplasty with coronary stent   . History of acute inferior wall myocardial infarction SEPT 2008      S/P STENTING PROXIMAL TO MID RCA  . Carotid stenosis, asymptomatic BILATERAL ICA --  0-39%  PER  DUPLEX 05-22-2011  . Endometrial hyperplasia   . History of breast cancer 2011--  ONCOLOGIST- DR Camelia Eng    S/P LEFT PARTIAL MASTECTOMY AND RADIATION---  NO RECURRENCE  . Dyspnea   . Anxiety   . CAD (coronary artery disease) CARDIOLOGIST-- DR Lewayne Bunting--- LAST VISIT 08-16-2011  NOTE IN  EPIC    Remote inferior MI in 2008. Extensive stenting of the RCA, s/p repeat stenting of the RCA in Dec 2010 due to restenosis  . Dry eyes, bilateral   . Clotting disorder   . Heart attack   . Cancer     breast    Past Surgical History  Procedure Date  . Tympanostomy tube placement   . Colonoscopy JULY 2013  . US echocardiography 05-22-2011    LVSF NORMAL/ EF 60-65%  . Left partial mastectomy w/ bx axillary lymph node  09-04-2009    INVASIVE MAMMARY CARCINOMA/  STAGE T1b NO  . Coronary angioplasty with stent placement 04-19-2009  DR PETER [place]    SINGLE VESSEL OBSTRUCTIVE DISEASE/  STENTING OF IN-STENT RESTENOSIS MID RCA/ NORMAL LVF  . Coronary angioplasty with stent placement SEPT 2008--  [facility]    STENTING PROXIMAL TO MID RCA  . Cardiovascular stress test 05-28-2011    LOW RISK STUDY  . Bilateral breast lumpectomy BEFORE 2008  . Dilation and curettage of uterus 10/10/11  . Cataract extraction w/ intraocular lens  implant, bilateral     Family History  Problem Relation Age of Onset  . Coronary artery disease Father     & Mother  . Heart attack Father   . Gout Father   . Osteoarthritis Mother   . Heart disease Mother     congestive heart failure  .  Hypertension Sister   . Heart disease Sister   . Cancer Sister     breast  . Breast cancer      Aunts  . Uterine cancer Sister   . Colon cancer Neg Hx     Social History History  Substance Use Topics  . Smoking status: Former Smoker -- 2.0 packs/day for 50 years    Types: Cigarettes    Quit date: 01/11/2007  . Smokeless tobacco: Never Used  . Alcohol Use: 3.6 oz/week    6 Glasses of wine per week    Allergies  Allergen Reactions  . Codeine Hives       . Penicillins Hives  . Shellfish Allergy Other (See Comments)    Absent reflexes    Current Outpatient Prescriptions  Medication Sig Dispense Refill  . albuterol (VENTOLIN HFA) 108 (90 BASE) MCG/ACT inhaler Inhale 2 puffs into the lungs  every 6 (six) hours as needed.       Marland Kitchen alendronate (FOSAMAX) 70 MG tablet Take 70 mg by mouth every 7 (seven) days. Take with a full glass of water on an empty stomach.      . ALPRAZolam (XANAX) 0.25 MG tablet Take 0.25 mg by mouth 2 (two) times daily as needed.       Marland Kitchen amiodarone (PACERONE) 200 MG tablet Take 100 mg by mouth every morning.   30 tablet  11  . aspirin 81 MG tablet Take 81 mg by mouth daily.      Marland Kitchen atorvastatin (LIPITOR) 40 MG tablet Take 1 tablet (40 mg total) by mouth daily.  90 tablet  3  . Calcium Lactate 750 MG TABS Take 1 tablet by mouth daily.       . cilostazol (PLETAL) 50 MG tablet Take 50 mg by mouth 2 (two) times daily.       . clopidogrel (PLAVIX) 75 MG tablet Take 1 tablet (75 mg total) by mouth daily.  90 tablet  3  . cycloSPORINE (RESTASIS) 0.05 % ophthalmic emulsion Place 1 drop into both eyes 2 (two) times daily.       . Fe Fum-FePoly-FA-Vit C-Vit B3 (INTEGRA F) 125-1 MG CAPS Take 1 tablet by mouth daily.  30 capsule  1  . fish oil-omega-3 fatty acids 1000 MG capsule Take 2 g by mouth daily. Taking Mega Red      . memantine (NAMENDA) 5 MG tablet Take 10 mg by mouth 2 (two) times daily.       . metoprolol (LOPRESSOR) 50 MG tablet Take 1 tablet (50 mg total) by mouth 2 (two) times daily.  180 tablet  3  . mirtazapine (REMERON) 30 MG tablet Take 30 mg by mouth at bedtime.       . Multiple Vitamin (MULTIVITAMIN) tablet Take 1 tablet by mouth daily.       . nitroGLYCERIN (NITROSTAT) 0.4 MG SL tablet Place 0.4 mg under the tongue every 5 (five) minutes as needed.       . tiotropium (SPIRIVA) 18 MCG inhalation capsule Place 18 mcg into inhaler and inhale every morning.       . valsartan-hydrochlorothiazide (DIOVAN-HCT) 160-12.5 MG per tablet Take 0.5 tablets by mouth daily.         Review of Systems Review of Systems  Constitutional: Negative for fever, chills and unexpected weight change.  HENT: Negative for hearing loss, congestion, sore throat, trouble swallowing  and voice change.   Eyes: Negative for visual disturbance.  Respiratory: Positive for cough and shortness of  breath. Negative for wheezing.   Cardiovascular: Negative for chest pain, palpitations and leg swelling.  Gastrointestinal: Negative for nausea, vomiting, abdominal pain, diarrhea, constipation, blood in stool, abdominal distention and anal bleeding.  Genitourinary: Negative for hematuria, vaginal bleeding and difficulty urinating.  Musculoskeletal: Positive for arthralgias.  Skin: Negative for rash and wound.  Neurological: Negative for seizures, syncope and headaches.  Hematological: Negative for adenopathy. Does not bruise/bleed easily.  Psychiatric/Behavioral: Negative for confusion.    Blood pressure 124/78, pulse 72, temperature 97.8 F (36.6 C), temperature source Temporal, resp. rate 14, height 4\' 11"  (1.499 m), weight 153 lb 6 oz (69.57 kg).  Physical Exam Physical Exam  Constitutional: She is oriented to person, place, and time. She appears well-developed and well-nourished. No distress.  HENT:  Head: Normocephalic and atraumatic.  Nose: Nose normal.  Mouth/Throat: No oropharyngeal exudate.  Eyes: Conjunctivae and EOM are normal. Pupils are equal, round, and reactive to light. Left eye exhibits no discharge. No scleral icterus.  Neck: Neck supple. No JVD present. No tracheal deviation present. No thyromegaly present.  Cardiovascular: Normal rate, regular rhythm, normal heart sounds and intact distal pulses.   No murmur heard. Pulmonary/Chest: Effort normal and breath sounds normal. No respiratory distress. She has no wheezes. She has no rales. She exhibits no tenderness.       Breath sounds distant. No wheeze.  Left breast reveals well-healed transverse scar superiorly and well-healed axillary scar. There is no palpable mass in either breast. There is no other skin changes. There is no axillary adenopathy.  Abdominal: Soft. Bowel sounds are normal. She exhibits no  distension and no mass. There is no tenderness. There is no rebound and no guarding.  Musculoskeletal: She exhibits no edema and no tenderness.  Lymphadenopathy:    She has no cervical adenopathy.  Neurological: She is alert and oriented to person, place, and time. She exhibits normal muscle tone. Coordination normal.  Skin: Skin is warm. No rash noted. She is not diaphoretic. No erythema. No pallor.  Psychiatric: She has a normal mood and affect. Her behavior is normal. Judgment and thought content normal.       Slightly depressed affect.    Data Reviewed Mammograms. [facility] notes. Old records.  Assessment    Invasive mammary carcinoma left breast, upper outer quadrant, triple negative tumor, initial stage TI B., N0.  No evidence of recurrence 2 years following left partial mastectomy,  SLN biopsy and adjuvant radiation therapy.  Recent diagnosis uterine cancer  Coronary disease  COPD, moderately severe  Hypertension.     Plan    Continue close clinical followup with Dr. Darnelle Catalan and her other internal medicine and specialty physicians.  Repeat bilateral mammograms  in April 2014  Return to see me in one year.       Angelia Mould. Derrell Lolling, M.D., [facility] General and Minimally invasive Surgery Breast and Colorectal Surgery Office:   [phone] Pager:   [phone]  10/23/2011, 10:27 AM

## 2011-10-23 NOTE — Patient Instructions (Signed)
Your recent mammograms are normal. Your physical exam today is also normal. There is no evidence of breast cancer.  Keep your regular appointments with Dr. Marikay Alar Magrinat.  Return to see Dr. Derrell Lolling in one year  You  should already be scheduled for bilateral mammograms in April 2014.

## 2011-10-24 ENCOUNTER — Encounter (HOSPITAL_COMMUNITY)
Admission: RE | Admit: 2011-10-24 | Discharge: 2011-10-24 | Disposition: A | Discharge status: Home / Self Care | Payer: Medicare Other | Source: Ambulatory Visit | Attending: Cardiology | Admitting: Cardiology

## 2011-10-27 ENCOUNTER — Encounter (HOSPITAL_COMMUNITY): Payer: Medicare Other

## 2011-10-28 ENCOUNTER — Encounter: Payer: Self-pay | Admitting: Gynecologic Oncology

## 2011-10-28 ENCOUNTER — Telehealth: Payer: Self-pay | Admitting: Cardiology

## 2011-10-28 ENCOUNTER — Ambulatory Visit: Payer: Medicare Other | Attending: Gynecologic Oncology | Admitting: Gynecologic Oncology

## 2011-10-28 VITALS — BP 138/74 | HR 70 | Temp 97.7°F | Resp 22 | Ht 59.84 in | Wt 157.0 lb

## 2011-10-28 DIAGNOSIS — I2510 Atherosclerotic heart disease of native coronary artery without angina pectoris: Secondary | ICD-10-CM | POA: Insufficient documentation

## 2011-10-28 DIAGNOSIS — C549 Malignant neoplasm of corpus uteri, unspecified: Secondary | ICD-10-CM | POA: Insufficient documentation

## 2011-10-28 DIAGNOSIS — Z79899 Other long term (current) drug therapy: Secondary | ICD-10-CM | POA: Insufficient documentation

## 2011-10-28 DIAGNOSIS — C541 Malignant neoplasm of endometrium: Secondary | ICD-10-CM | POA: Insufficient documentation

## 2011-10-28 DIAGNOSIS — Z7982 Long term (current) use of aspirin: Secondary | ICD-10-CM | POA: Insufficient documentation

## 2011-10-28 DIAGNOSIS — E785 Hyperlipidemia, unspecified: Secondary | ICD-10-CM | POA: Insufficient documentation

## 2011-10-28 DIAGNOSIS — I10 Essential (primary) hypertension: Secondary | ICD-10-CM | POA: Insufficient documentation

## 2011-10-28 DIAGNOSIS — J438 Other emphysema: Secondary | ICD-10-CM | POA: Insufficient documentation

## 2011-10-28 DIAGNOSIS — I252 Old myocardial infarction: Secondary | ICD-10-CM | POA: Insufficient documentation

## 2011-10-28 DIAGNOSIS — Z853 Personal history of malignant neoplasm of breast: Secondary | ICD-10-CM | POA: Insufficient documentation

## 2011-10-28 DIAGNOSIS — Z87891 Personal history of nicotine dependence: Secondary | ICD-10-CM | POA: Insufficient documentation

## 2011-10-28 MED ORDER — MEGESTROL ACETATE 40 MG PO TABS: 40.0000 mg | ORAL_TABLET | Freq: Two times a day (BID) | ORAL | Status: DC

## 2011-10-28 NOTE — Telephone Encounter (Signed)
Patient's daughter called was told ok with Dr.Jordan for patient to have hysterectomy.Advised to hold plavix 5 days before surgery.

## 2011-10-28 NOTE — Telephone Encounter (Signed)
New msg Patient needs surgical clearance for  Hysterectomy. Please call her daughter back.

## 2011-10-28 NOTE — Patient Instructions (Addendum)
Megace 40 mg twice daily until cardiac evaluation is obtained. I have asked her to followup in July 3 at which time the treatment plan will be solidified.

## 2011-10-28 NOTE — Progress Notes (Signed)
Consult Note: Gyn-Onc  Consult was requested by Dr. Richardean Chimera for the evaluation of Melinda Mora 78 y.o. female  CC:  Chief Complaint  Patient presents with  . Endo ca    New pt    HPI:  This is a 78 y/o Gravida 1 Para 1  With a last normal menstrual period in her 67s. Patient reports intermittent uterine bleeding for approximately 4.5 years. In May 2013  the bleeding was quite heavy and she presented to Dr. Arelia Sneddon.  An endometrial biopsy was consistent with complex atypical hyperplasia. The ultrasound was suggestive of uterine polyps. She underwent hysteroscopy and D&C on May 31.   The final pathology was consistent with a FIGO grade 1 endometrial adenocarcinoma.   Interval History: Since the uterine hysteroscopy she's had minimal bleeding. The patient's history is notable for a left partial mastectomy in 2009. 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 She also has a significant cardiac history notable for myocardial infarction 2009 at which time 2 stents were placed. An additional stent was placed in 2010. In December of 2012 she underwent evaluation for ventricular tachycardia the patient states at that time she underwent a stress test that was negative.  She is currently on numerous cardiac meds inclusive of Plavix aspirin and nitroglycerin.  Performance status: The patient is currently in maintenance cardiac rehabilitation. She does not walk to her mailbox because it causes fatigue.  She is unable to climb a flight of stairs without shortness of breath. She does state that she is able to attend to her activities of daily living and this does include grocery shopping   Current Meds:  Outpatient Encounter Prescriptions as of 10/28/2011  Medication Sig Dispense Refill  . albuterol (VENTOLIN HFA) 108 (90 BASE) MCG/ACT inhaler Inhale 2 puffs into the lungs every 6 (six) hours as needed.       Marland Kitchen alendronate (FOSAMAX) 70 MG tablet  Take 70 mg by mouth every 7 (seven) days. Take with a full glass of water on an empty stomach.      . ALPRAZolam (XANAX) 0.25 MG tablet Take 0.25 mg by mouth 2 (two) times daily as needed.       Marland Kitchen amiodarone (PACERONE) 200 MG tablet Take 100 mg by mouth every morning.   30 tablet  11  . aspirin 81 MG tablet Take 81 mg by mouth daily.      Marland Kitchen atorvastatin (LIPITOR) 40 MG tablet Take 1 tablet (40 mg total) by mouth daily.  90 tablet  3  . Calcium Lactate 750 MG TABS Take 1 tablet by mouth daily.       . cilostazol (PLETAL) 50 MG tablet Take 50 mg by mouth 2 (two) times daily.       . clopidogrel (PLAVIX) 75 MG tablet Take 1 tablet (75 mg total) by mouth daily.  90 tablet  3  . Fe Fum-FePoly-FA-Vit C-Vit B3 (INTEGRA F) 125-1 MG CAPS Take 1 tablet by mouth daily.  30 capsule  1  . fish oil-omega-3 fatty acids 1000 MG capsule Take 2 g by mouth daily. Taking Mega Red      . megestrol (MEGACE) 40 MG tablet Take 1 tablet (40 mg total) by mouth 2 (two) times daily.  60 tablet  3  . memantine (NAMENDA) 5 MG tablet Take 10 mg by mouth 2 (two) times daily.       . metoprolol (LOPRESSOR) 50 MG tablet Take 1 tablet (  50 mg total) by mouth 2 (two) times daily.  180 tablet  3  . mirtazapine (REMERON) 30 MG tablet Take 30 mg by mouth at bedtime.       . Multiple Vitamin (MULTIVITAMIN) tablet Take 1 tablet by mouth daily.       . nitroGLYCERIN (NITROSTAT) 0.4 MG SL tablet Place 0.4 mg under the tongue every 5 (five) minutes as needed.       . tiotropium (SPIRIVA) 18 MCG inhalation capsule Place 18 mcg into inhaler and inhale every morning.       . valsartan-hydrochlorothiazide (DIOVAN-HCT) 160-12.5 MG per tablet Take 0.5 tablets by mouth daily.       Marland Kitchen DISCONTD: megestrol (MEGACE) 40 MG tablet Take 40 mg by mouth daily.      . cycloSPORINE (RESTASIS) 0.05 % ophthalmic emulsion Place 1 drop into both eyes 2 (two) times daily.         Allergy:  Allergies  Allergen Reactions  . Codeine Hives       . Penicillins  Hives  . Shellfish Allergy Other (See Comments)    Absent reflexes    Social Hx:   History   Social History  . Marital Status: Widowed    Spouse Name: N/A    Number of Children: 1  . Years of Education: N/A   Occupational History  . Retired Print production planner, [employer]    Social History Main Topics  . Smoking status: Former Smoker -- 2.0 packs/day for 50 years    Types: Cigarettes    Quit date: 01/11/2007  . Smokeless tobacco: Never Used  . Alcohol Use: 3.6 oz/week    6 Glasses of wine per week  . Drug Use: No  . Sexually Active: Not on file   Other Topics Concern  . Not on file   Social History Narrative   Daily caffeine 2-3 cups per day    Past Surgical Hx:  Past Surgical History  Procedure Date  . Tympanostomy tube placement   . Colonoscopy JULY 2013  . US echocardiography 05-22-2011    LVSF NORMAL/ EF 60-65%  . Left partial mastectomy w/ bx axillary lymph node  09-04-2009    INVASIVE MAMMARY CARCINOMA/  STAGE T1b NO  . Coronary angioplasty with stent placement 04-19-2009  DR PETER [place]    SINGLE VESSEL OBSTRUCTIVE DISEASE/  STENTING OF IN-STENT RESTENOSIS MID RCA/ NORMAL LVF  . Coronary angioplasty with stent placement SEPT 2008--  [facility]    STENTING PROXIMAL TO MID RCA  . Cardiovascular stress test 05-28-2011    LOW RISK STUDY  . Bilateral breast lumpectomy BEFORE 2008  . Dilation and curettage of uterus 10/10/11  . Cataract extraction w/ intraocular lens  implant, bilateral     Past Medical Hx:  Past Medical History  Diagnosis Date  . Hyperlipidemia   . Hypertension   . Depression   . Osteoarthritis   . Allergic rhinitis   . Raynaud disease   . Adenomatous polyp 03/2004  . Diverticulosis   . Syncope Jan 2013    NSVT noted on event monitor  . COPD (chronic obstructive pulmonary disease) with emphysema   . Pulmonary nodule, right x2  middle and lower lobes--  stable per follow-ip ct  jan 2012  . S/P primary angioplasty  with coronary stent   . History of acute inferior wall myocardial infarction SEPT 2008      S/P STENTING PROXIMAL TO MID RCA  . Carotid stenosis, asymptomatic BILATERAL ICA --  0-39%  PER  DUPLEX 05-22-2011  . Endometrial hyperplasia   . History of breast cancer 2011--  ONCOLOGIST- DR Camelia Eng    S/P LEFT PARTIAL MASTECTOMY AND RADIATION---  NO RECURRENCE  . Dyspnea   . Anxiety   . CAD (coronary artery disease) CARDIOLOGIST-- DR Lewayne Bunting--- LAST VISIT 08-16-2011  NOTE IN EPIC    Remote inferior MI in 2008. Extensive stenting of the RCA, s/p repeat stenting of the RCA in Dec 2010 due to restenosis  . Dry eyes, bilateral   . Clotting disorder   . Heart attack   . Cancer     breast    Past Gynecological History:  Gravida 1 para 1. Reports to prior D&Cs but denies any history of abnormal Pap tests. Menarche occurred at 35 with menopause in her 30s. She reports a diagnosis of uterine fibroids during her reproductive years. An ultrasound from 09/23/2011 is notable for a uterine volume of 5.31 cc a uterine with a 4.50 cm. The uterine length is unclear.  Family Hx:  Family History  Problem Relation Age of Onset  . Coronary artery disease Father     & Mother  . Heart attack Father   . Gout Father   . Osteoarthritis Mother   . Heart disease Mother     congestive heart failure  . Hypertension Sister   . Heart disease Sister   . Cancer Sister     breast  . Breast cancer      Aunts  . Uterine cancer Sister   . Colon cancer Neg Hx    Review of Systems:  Constitutional  Feels well,  Cardiovascular  No chest pain does not use nitroglycerin frequently. Reports shortness of breath with minimal exertion. Denies lower extremity edema  Pulmonary  No cough or wheeze.  Gastro Intestinal  No nausea, vomitting, or diarrhoea. No bright red blood per rectum, no abdominal pain, no change in bowel movement, or constipation.  Genito Urinary  No frequency, urgency, dysuria, reports some small  vaginal spotting Musculo Skeletal  No myalgia, arthralgia, joint swelling or pain  Neurologic  No weakness, numbness, change in gait,  Psychology  No depression, anxiety, insomnia.    Vitals:  Blood pressure 138/74, pulse 70, temperature 97.7 F (36.5 C), temperature source Oral, resp. rate 22, height 4' 11.84" (1.52 m), weight 157 lb (71.215 kg).  Physical Exam: WD in NAD Neck  Supple NROM, without any enlargements.  Lymph Node Survey No cervical supraclavicular or inguinal adenopathy Cardiovascular  Pulse normal rate, regularity and rhythm. S1 and S2 normal.  Lungs  Clear to auscultation bilateraly, without wheezes/crackles/rhonchi. Good air movement.  Skin  No rash/lesions/breakdown  Psychiatry  Alert and oriented to person, place, and time  Abdomen  Normoactive bowel sounds, abdomen soft, non-tender and obese. No palpable masses, no evidence of ascites. Back No CVA tenderness Genito Urinary  Vulva/vagina: Normal external female genitalia.  No lesions. Vaginal bleeding noted.   Bladder/urethra:  No lesions or masses, grade 1 cystocele  Vagina: Atrophic with a small amount of blood  Cervix: Normal appearing, approximately 3 cm no lesions.  Uterus: Mobile, no parametrial involvement or nodularity.  Unable to assess uterine size because of body habitus  Adnexa: No palpable masses. Rectal  Good tone, no masses no cul de sac nodularity.  Extremities  No bilateral cyanosis, clubbing or edema.   Assessment/Plan:  Ms. Melinda Mora  is a 78 y.o.  year old with grade 1 endometrial cancer. History is notable  for triple negative breast cancer diagnosed in 2009. The surgical options available to this patient are unclear because of her cardiac history. I've asked the patient and her daughter to followup with her cardiologist to coronary whether not this patient can be medically cleared because of her cardiac insufficiency history of arrhythmia and COPD. It would be necessary for  her to be off Plavix to have this procedure in am unclear that this is a safe course of action.  If surgical clearance is obtained the preferable route to be a robotic-assisted total laparoscopic hysterectomy bilateral salpingo-oophorectomy with possible lymph node dissection.  If the patient unable to tolerate Trendelenburg then the surgical approach would be either abdominal or vaginal.  The nonsurgical options for treatment of this uterine cancer are Megace, and or use of Mirena IUD. I started the patient on Megace 40 mg twice daily until cardiac evaluation is obtained. I have asked her to followup in November 12, 2011 after the cardiac evaluation has been completed at which time the treatment plan will be solidified.   Laurette Schimke, MD, PhD 10/28/2011, 10:08 AM

## 2011-10-29 ENCOUNTER — Encounter (HOSPITAL_COMMUNITY)
Admission: RE | Admit: 2011-10-29 | Discharge: 2011-10-29 | Disposition: A | Discharge status: Home / Self Care | Payer: Medicare Other | Source: Ambulatory Visit | Attending: Cardiology | Admitting: Cardiology

## 2011-10-29 ENCOUNTER — Telehealth: Payer: Self-pay | Admitting: Internal Medicine

## 2011-10-29 NOTE — Telephone Encounter (Signed)
Pt's dtr called yesterday to ok for pt to have hysterectomy, and to off plavix, pt will be inverted for about 3 hours and wants to know if heart ok to go through this, dr [place] ok'd it, now dtr wants dr taylor to ok it as well, pls call

## 2011-10-30 NOTE — Telephone Encounter (Signed)
Spoke with the daughter and let her know I will let Dr Ladona Ridgel look at this on Tues  Not set yet will probably do in July

## 2011-10-30 NOTE — Telephone Encounter (Signed)
F/u   Patient daughter Karen Kitchens calling for status, please return call on [phone].

## 2011-10-31 ENCOUNTER — Telehealth: Payer: Self-pay | Admitting: Gynecologic Oncology

## 2011-10-31 ENCOUNTER — Encounter (HOSPITAL_COMMUNITY)
Admission: RE | Admit: 2011-10-31 | Discharge: 2011-10-31 | Disposition: A | Discharge status: Home / Self Care | Payer: Medicare Other | Source: Ambulatory Visit | Attending: Cardiology | Admitting: Cardiology

## 2011-10-31 NOTE — Telephone Encounter (Signed)
Daughter informed of the plan for her mother's hysterectomy on July 23rd if cardiac clearance has been obtained.  Daughter verbalizing understanding.  Follow up appointment arranged for July 3rd for pre-op.  No questions or concerns voiced.

## 2011-11-03 ENCOUNTER — Encounter (HOSPITAL_COMMUNITY): Payer: Medicare Other

## 2011-11-05 ENCOUNTER — Encounter (HOSPITAL_COMMUNITY)
Admission: RE | Admit: 2011-11-05 | Discharge: 2011-11-05 | Disposition: A | Discharge status: Home / Self Care | Payer: Medicare Other | Source: Ambulatory Visit | Attending: Cardiology | Admitting: Cardiology

## 2011-11-05 NOTE — Telephone Encounter (Signed)
lmom for daughter that Dr Ladona Ridgel felt it is okay to proceed wit surgery

## 2011-11-07 ENCOUNTER — Encounter (HOSPITAL_COMMUNITY): Payer: Medicare Other

## 2011-11-10 ENCOUNTER — Encounter (HOSPITAL_COMMUNITY)
Admission: RE | Admit: 2011-11-10 | Discharge: 2011-11-10 | Disposition: A | Discharge status: Home / Self Care | Payer: Medicare Other | Source: Ambulatory Visit | Attending: Cardiology | Admitting: Cardiology

## 2011-11-10 DIAGNOSIS — J449 Chronic obstructive pulmonary disease, unspecified: Secondary | ICD-10-CM | POA: Insufficient documentation

## 2011-11-10 DIAGNOSIS — Z9861 Coronary angioplasty status: Secondary | ICD-10-CM | POA: Insufficient documentation

## 2011-11-10 DIAGNOSIS — I2510 Atherosclerotic heart disease of native coronary artery without angina pectoris: Secondary | ICD-10-CM | POA: Insufficient documentation

## 2011-11-10 DIAGNOSIS — J4489 Other specified chronic obstructive pulmonary disease: Secondary | ICD-10-CM | POA: Insufficient documentation

## 2011-11-10 DIAGNOSIS — E785 Hyperlipidemia, unspecified: Secondary | ICD-10-CM | POA: Insufficient documentation

## 2011-11-10 DIAGNOSIS — Z5189 Encounter for other specified aftercare: Secondary | ICD-10-CM | POA: Insufficient documentation

## 2011-11-10 DIAGNOSIS — I10 Essential (primary) hypertension: Secondary | ICD-10-CM | POA: Insufficient documentation

## 2011-11-10 DIAGNOSIS — I252 Old myocardial infarction: Secondary | ICD-10-CM | POA: Insufficient documentation

## 2011-11-10 HISTORY — PX: COLONOSCOPY: SHX174

## 2011-11-12 ENCOUNTER — Encounter: Payer: Self-pay | Admitting: Gynecologic Oncology

## 2011-11-12 ENCOUNTER — Ambulatory Visit: Payer: Medicare Other | Attending: Gynecologic Oncology | Admitting: Gynecologic Oncology

## 2011-11-12 ENCOUNTER — Encounter (HOSPITAL_COMMUNITY): Payer: Medicare Other

## 2011-11-12 VITALS — BP 128/68 | HR 62 | Temp 97.9°F | Resp 22 | Ht 59.84 in | Wt 158.0 lb

## 2011-11-12 DIAGNOSIS — I10 Essential (primary) hypertension: Secondary | ICD-10-CM | POA: Insufficient documentation

## 2011-11-12 DIAGNOSIS — C549 Malignant neoplasm of corpus uteri, unspecified: Secondary | ICD-10-CM | POA: Insufficient documentation

## 2011-11-12 DIAGNOSIS — Z853 Personal history of malignant neoplasm of breast: Secondary | ICD-10-CM | POA: Insufficient documentation

## 2011-11-12 DIAGNOSIS — J4489 Other specified chronic obstructive pulmonary disease: Secondary | ICD-10-CM | POA: Insufficient documentation

## 2011-11-12 DIAGNOSIS — I2510 Atherosclerotic heart disease of native coronary artery without angina pectoris: Secondary | ICD-10-CM | POA: Insufficient documentation

## 2011-11-12 DIAGNOSIS — J449 Chronic obstructive pulmonary disease, unspecified: Secondary | ICD-10-CM | POA: Insufficient documentation

## 2011-11-12 DIAGNOSIS — E785 Hyperlipidemia, unspecified: Secondary | ICD-10-CM | POA: Insufficient documentation

## 2011-11-12 DIAGNOSIS — C541 Malignant neoplasm of endometrium: Secondary | ICD-10-CM

## 2011-11-12 NOTE — Patient Instructions (Addendum)
  The nonsurgical options for treatment of this uterine cancer are Megace, and or use of Mirena IUD. My recommendation is for a Mirena IUD with interval EMB.  A CT of the abdomen and pelvis will be obtained to assess for extrauterine disease.  If metastatic disease is identified will discuss chemotherapy with you.  The options are placement of the IUD intraoperatively on 12/02/2011 or placement of the IUD by Dr. Arelia Sneddon in the office.  Dr. Arelia Sneddon has graciously agreed to perform this procedure.    F/U in 3 months.  Thank you very much Ms. Melinda Mora for allowing me to provide care for you today.  I appreciate your confidence in choosing our Gynecologic Oncology team.  If you have any questions about your visit today please call our office and we will get back to you as soon as possible.  Maryclare Labrador. Lynnita Somma MD., PhD Gynecologic Oncology

## 2011-11-12 NOTE — Progress Notes (Signed)
Office Note: Gyn-Onc   Melinda Mora 78 y.o. female with grade 1 endometrial cancer presents for discussion regarding treatment options.  CC:  Chief Complaint  Patient presents with  . Endometrial cancer    HPI:  This is a 78 y/o Gravida 1 Para 1  With a last normal menstrual period in her 77s. Patient reports intermittent uterine bleeding for approximately 4.5 years. In May 2013  the bleeding was quite heavy and she presented to Dr. Arelia Sneddon.  An endometrial biopsy was consistent with complex atypical hyperplasia. The ultrasound was suggestive of uterine polyps. She underwent hysteroscopy and D&C on May 31.   The final pathology was consistent with a FIGO grade 1 endometrial adenocarcinoma.   Interval History: Since the uterine hysteroscopy she's had minimal bleeding. The patient's history is notable for a left partial mastectomy in 2009. 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 She also has a significant cardiac history notable for myocardial infarction 2009 at which time 2 stents were placed. An additional stent was placed in 2010. In December of 2012 she underwent evaluation for ventricular tachycardia the patient states at that time she underwent a stress test that was negative.  She is currently on numerous cardiac meds inclusive of Plavix aspirin and nitroglycerin.   The patient is currently in maintenance cardiac rehabilitation. She does not walk to her mailbox because it causes fatigue.  She is unable to climb a flight of stairs without shortness of breath. She does state that she is able to attend to her activities of daily living and this does include grocery shopping.  A telephone note from LPN at Dr. Lubertha Basque office advised discontinuation of Plavix for 5 days preop but did not provide an assessment of the cardiac risk.     Current Meds:  See Medications  Allergy:  Allergies  Allergen Reactions  . Codeine Hives       . Penicillins Hives  . Shellfish Allergy Other (See Comments)    Absent reflexes    Social Hx:    Unchanged  Past Surgical Hx:  Past Surgical History  Procedure Date  . Tympanostomy tube placement   . Colonoscopy JULY 2013  . US echocardiography 05-22-2011    LVSF NORMAL/ EF 60-65%  . Left partial mastectomy w/ bx axillary lymph node  09-04-2009    INVASIVE MAMMARY CARCINOMA/  STAGE T1b NO  . Coronary angioplasty with stent placement 04-19-2009  DR PETER [place]    SINGLE VESSEL OBSTRUCTIVE DISEASE/  STENTING OF IN-STENT RESTENOSIS MID RCA/ NORMAL LVF  . Coronary angioplasty with stent placement SEPT 2008--  [facility]    STENTING PROXIMAL TO MID RCA  . Cardiovascular stress test 05-28-2011    LOW RISK STUDY  . Bilateral breast lumpectomy BEFORE 2008  . Dilation and curettage of uterus 10/10/11  . Cataract extraction w/ intraocular lens  implant, bilateral     Past Medical Hx:  Past Medical History  Diagnosis Date  . Hyperlipidemia   . Hypertension   . Depression   . Osteoarthritis   . Allergic rhinitis   . Raynaud disease   . Adenomatous polyp 03/2004  . Diverticulosis   . Syncope Jan 2013    NSVT noted on event monitor  . COPD (chronic obstructive pulmonary disease) with emphysema   . Pulmonary nodule, right x2  middle and lower lobes--  stable per follow-ip ct  jan 2012  . S/P primary angioplasty with coronary  stent   . History of acute inferior wall myocardial infarction SEPT 2008      S/P STENTING PROXIMAL TO MID RCA  . Carotid stenosis, asymptomatic BILATERAL ICA --  0-39%  PER  DUPLEX 05-22-2011  . Endometrial hyperplasia   . History of breast cancer 2011--  ONCOLOGIST- DR Camelia Eng    S/P LEFT PARTIAL MASTECTOMY AND RADIATION---  NO RECURRENCE  . Dyspnea   . Anxiety   . CAD (coronary artery disease) CARDIOLOGIST-- DR Lewayne Bunting--- LAST VISIT 08-16-2011  NOTE IN EPIC    Remote inferior MI in 2008. Extensive stenting of the RCA, s/p repeat  stenting of the RCA in Dec 2010 due to restenosis  . Dry eyes, bilateral   . Clotting disorder   . Heart attack   . Cancer     breast    Past Gynecological History:  Gravida 1 para 1. Reports to prior D&Cs but denies any history of abnormal Pap tests. Menarche occurred at 21 with menopause in her 56s. She reports a diagnosis of uterine fibroids during her reproductive years. An ultrasound from 09/23/2011 is notable for a uterine volume of 5.31 cc a uterine with a 4.50 cm. The uterine length is unclear.  Family Hx:  Family History  Problem Relation Age of Onset  . Coronary artery disease Father     & Mother  . Heart attack Father   . Gout Father   . Osteoarthritis Mother   . Heart disease Mother     congestive heart failure  . Hypertension Sister   . Heart disease Sister   . Cancer Sister     breast  . Breast cancer      Aunts  . Uterine cancer Sister   . Colon cancer Neg Hx    Vitals:  Blood pressure 128/68, pulse 62, temperature 97.9 F (36.6 C), temperature source Oral, resp. rate 22, height 4' 11.84" (1.52 m), weight 158 lb (71.668 kg).  Physical Exam: WD in NAD   Assessment/Plan:  Melinda Mora  is a 78 y.o.  year old with grade 1 endometrial cancer. History is notable for triple negative breast cancer diagnosed in 2009.  The stress test and cardiac status of the patient was reviewed by anesthesiology Dr. Ty Hilts who believes that the patient is as optimized as is feasible.  He states that her risk of a perioperative event is significant.  A  discussion was held with the patient and her family with regard to to her endometrial cancer diagnosis and nonsurgical options.    The nonsurgical options for treatment of this uterine cancer are Megace, and or use of Mirena IUD. I started the patient on Megace 40 mg twice daily.  My recommendation is for a Mirena IUD with interval EMB.  A CT of the abdomen and pelvis will be obtained to assess for extrauterine disease.  If  metastatic disease is identified will discuss chemotherapy with the patient.  The options are placement of the IUD intraoperatively on 12/02/2011 or placement of the IUD by Dr. Arelia Sneddon in the office.  Dr. Arelia Sneddon has graciously agreed to perform this procedure.  All of her questions were answered to her satisfaction.   Greater than 30 minutes were spent with the patient and her family in discussion of the operative risk and non surgical options.  F/U in 3 months.   Laurette Schimke, MD, PhD 11/12/2011, 3:39 PM

## 2011-11-14 ENCOUNTER — Encounter (HOSPITAL_COMMUNITY): Payer: Medicare Other

## 2011-11-17 ENCOUNTER — Encounter (HOSPITAL_COMMUNITY): Payer: Medicare Other

## 2011-11-18 ENCOUNTER — Encounter: Payer: Self-pay | Admitting: Internal Medicine

## 2011-11-18 ENCOUNTER — Other Ambulatory Visit (HOSPITAL_COMMUNITY): Payer: Self-pay | Admitting: Internal Medicine

## 2011-11-18 ENCOUNTER — Ambulatory Visit (INDEPENDENT_AMBULATORY_CARE_PROVIDER_SITE_OTHER): Payer: Medicare Other | Admitting: Internal Medicine

## 2011-11-18 VITALS — BP 122/76 | HR 68 | Ht 59.0 in | Wt 154.1 lb

## 2011-11-18 DIAGNOSIS — I472 Ventricular tachycardia, unspecified: Secondary | ICD-10-CM

## 2011-11-18 DIAGNOSIS — J449 Chronic obstructive pulmonary disease, unspecified: Secondary | ICD-10-CM

## 2011-11-18 DIAGNOSIS — J4489 Other specified chronic obstructive pulmonary disease: Secondary | ICD-10-CM

## 2011-11-18 DIAGNOSIS — I2510 Atherosclerotic heart disease of native coronary artery without angina pectoris: Secondary | ICD-10-CM

## 2011-11-18 DIAGNOSIS — R0689 Other abnormalities of breathing: Secondary | ICD-10-CM

## 2011-11-18 NOTE — Patient Instructions (Addendum)
Your physician wants you to follow-up in: 6 months with Dr Taylor You will receive a reminder letter in the mail two months in advance. If you don't receive a letter, please call our office to schedule the follow-up appointment.  

## 2011-11-18 NOTE — Assessment & Plan Note (Signed)
She has chronic lung disease and may well need home oxygen. She is not coughing. I am reluctant to stop amiodarone at low dose at this point.

## 2011-11-18 NOTE — Assessment & Plan Note (Signed)
She denies anginal symptoms. She will continue her current medical therapy. 

## 2011-11-18 NOTE — Progress Notes (Signed)
HPI Melinda Mora returns today for followup. She is a 78 year old woman with a history of ventricular tachycardia, as well as coronary disease with preserved left ventricular function. The patient has had problems with dyspnea. She was seen by her primary doctor, and wore pulse oximetry, which demonstrated oxygen desaturation. She is scheduled for an oxygen saturation nocturnal study in several days. In addition , the patient has been diagnosed with endometrial cancer. She denies cough or hemoptysis. No fever or chills. Palpitations are present well-controlled.  Allergies  Allergen Reactions  . Codeine Hives       . Penicillins Hives  . Shellfish Allergy Other (See Comments)    Absent reflexes     Current Outpatient Prescriptions  Medication Sig Dispense Refill  . albuterol (VENTOLIN HFA) 108 (90 BASE) MCG/ACT inhaler Inhale 2 puffs into the lungs every 6 (six) hours as needed.       Marland Kitchen alendronate (FOSAMAX) 70 MG tablet Take 70 mg by mouth every 7 (seven) days. Take with a full glass of water on an empty stomach.      . ALPRAZolam (XANAX) 0.25 MG tablet Take 0.25 mg by mouth 2 (two) times daily as needed.       Marland Kitchen amiodarone (PACERONE) 200 MG tablet Take 100 mg by mouth every morning.   30 tablet  11  . aspirin 81 MG tablet Take 81 mg by mouth daily.      Marland Kitchen atorvastatin (LIPITOR) 40 MG tablet Take 1 tablet (40 mg total) by mouth daily.  90 tablet  3  . Calcium Lactate 750 MG TABS Take 1 tablet by mouth daily.       . cilostazol (PLETAL) 50 MG tablet Take 50 mg by mouth 2 (two) times daily.       . clopidogrel (PLAVIX) 75 MG tablet Take 1 tablet (75 mg total) by mouth daily.  90 tablet  3  . Ferrous Sulfate (IRON) 90 (18 FE) MG TABS Take 18 mg by mouth daily.      . megestrol (MEGACE) 40 MG tablet Take 1 tablet (40 mg total) by mouth 2 (two) times daily.  60 tablet  3  . memantine (NAMENDA) 5 MG tablet Take 10 mg by mouth 2 (two) times daily.       . metoprolol (LOPRESSOR) 50 MG tablet Take 1  tablet (50 mg total) by mouth 2 (two) times daily.  180 tablet  3  . mirtazapine (REMERON) 30 MG tablet Take 30 mg by mouth at bedtime.       . Multiple Vitamin (MULTIVITAMIN) tablet Take 1 tablet by mouth daily.       . nitroGLYCERIN (NITROSTAT) 0.4 MG SL tablet Place 0.4 mg under the tongue every 5 (five) minutes as needed.       . Omega-3 Fatty Acids (FISH OIL) 1200 MG CAPS Take 1 capsule by mouth daily.      Marland Kitchen tiotropium (SPIRIVA) 18 MCG inhalation capsule Place 18 mcg into inhaler and inhale every morning.       . valsartan-hydrochlorothiazide (DIOVAN-HCT) 160-12.5 MG per tablet Take 0.5 tablets by mouth daily.          Past Medical History  Diagnosis Date  . Hyperlipidemia   . Hypertension   . Depression   . Osteoarthritis   . Allergic rhinitis   . Raynaud disease   . Adenomatous polyp 03/2004  . Diverticulosis   . Syncope Jan 2013    NSVT noted on event monitor  . COPD (chronic  obstructive pulmonary disease) with emphysema   . Pulmonary nodule, right x2  middle and lower lobes--  stable per follow-ip ct  jan 2012  . S/P primary angioplasty with coronary stent   . History of acute inferior wall myocardial infarction SEPT 2008      S/P STENTING PROXIMAL TO MID RCA  . Carotid stenosis, asymptomatic BILATERAL ICA --  0-39%  PER  DUPLEX 05-22-2011  . Endometrial hyperplasia   . History of breast cancer 2011--  ONCOLOGIST- DR Camelia Eng    S/P LEFT PARTIAL MASTECTOMY AND RADIATION---  NO RECURRENCE  . Dyspnea   . Anxiety   . CAD (coronary artery disease) CARDIOLOGIST-- DR Lewayne Bunting--- LAST VISIT 08-16-2011  NOTE IN EPIC    Remote inferior MI in 2008. Extensive stenting of the RCA, s/p repeat stenting of the RCA in Dec 2010 due to restenosis  . Dry eyes, bilateral   . Clotting disorder   . Heart attack   . Cancer     breast    ROS:   All systems reviewed and negative except as noted in the HPI.   Past Surgical History  Procedure Date  . Tympanostomy tube placement     . Colonoscopy JULY 2013  . US echocardiography 05-22-2011    LVSF NORMAL/ EF 60-65%  . Left partial mastectomy w/ bx axillary lymph node  09-04-2009    INVASIVE MAMMARY CARCINOMA/  STAGE T1b NO  . Coronary angioplasty with stent placement 04-19-2009  DR PETER [place]    SINGLE VESSEL OBSTRUCTIVE DISEASE/  STENTING OF IN-STENT RESTENOSIS MID RCA/ NORMAL LVF  . Coronary angioplasty with stent placement SEPT 2008--  [facility]    STENTING PROXIMAL TO MID RCA  . Cardiovascular stress test 05-28-2011    LOW RISK STUDY  . Bilateral breast lumpectomy BEFORE 2008  . Dilation and curettage of uterus 10/10/11  . Cataract extraction w/ intraocular lens  implant, bilateral      Family History  Problem Relation Age of Onset  . Coronary artery disease Father     & Mother  . Heart attack Father   . Gout Father   . Osteoarthritis Mother   . Heart disease Mother     congestive heart failure  . Hypertension Sister   . Heart disease Sister   . Cancer Sister     breast  . Breast cancer      Aunts  . Uterine cancer Sister   . Colon cancer Neg Hx      History   Social History  . Marital Status: Widowed    Spouse Name: N/A    Number of Children: 1  . Years of Education: N/A   Occupational History  . Retired Print production planner, [employer]    Social History Main Topics  . Smoking status: Former Smoker -- 2.0 packs/day for 50 years    Types: Cigarettes    Quit date: 01/11/2007  . Smokeless tobacco: Never Used  . Alcohol Use: 3.6 oz/week    6 Glasses of wine per week  . Drug Use: No  . Sexually Active: Not on file   Other Topics Concern  . Not on file   Social History Narrative   Daily caffeine 2-3 cups per day     BP 122/76  Pulse 68  Ht 4\' 11"  (1.499 m)  Wt 154 lb 1.9 oz (69.908 kg)  BMI 31.13 kg/m2  Physical Exam:  Well appearing  78 year old woman, NAD HEENT: Unremarkable Neck:  No  JVD, no thyromegally Lungs:  ClearWith decreased breath sounds  throughout. HEART:  Regular rate rhythm, no murmurs, no rubs, no clicks Abd:  soft, positive bowel sounds, no organomegally, no rebound, no guarding Ext:  2 plus pulses, no edema, no cyanosis, no clubbing Skin:  No rashes no nodules Neuro:  CN II through XII intact, motor grossly intact  EKG nonsustained ventricular tachycardia is present on cardiac monitoring  Assess/Plan:

## 2011-11-18 NOTE — Assessment & Plan Note (Signed)
She has a difficult constellation of problems. She has symptomatic ventricular tachycardia. She is recently well controlled on low-dose amiodarone. I am reluctant to increase the dose because of her underlying lung condition. She will undergo watchful waiting.

## 2011-11-19 ENCOUNTER — Encounter (HOSPITAL_COMMUNITY): Payer: Medicare Other

## 2011-11-21 ENCOUNTER — Ambulatory Visit (HOSPITAL_COMMUNITY)
Admission: RE | Admit: 2011-11-21 | Discharge: 2011-11-21 | Disposition: A | Discharge status: Home / Self Care | Payer: Medicare Other | Source: Ambulatory Visit | Attending: Gynecologic Oncology | Admitting: Gynecologic Oncology

## 2011-11-21 ENCOUNTER — Encounter (HOSPITAL_COMMUNITY): Payer: Medicare Other

## 2011-11-21 ENCOUNTER — Other Ambulatory Visit (HOSPITAL_COMMUNITY): Payer: Self-pay

## 2011-11-21 DIAGNOSIS — R0689 Other abnormalities of breathing: Secondary | ICD-10-CM

## 2011-11-21 DIAGNOSIS — R0609 Other forms of dyspnea: Secondary | ICD-10-CM | POA: Insufficient documentation

## 2011-11-21 DIAGNOSIS — C549 Malignant neoplasm of corpus uteri, unspecified: Secondary | ICD-10-CM | POA: Insufficient documentation

## 2011-11-21 DIAGNOSIS — C541 Malignant neoplasm of endometrium: Secondary | ICD-10-CM

## 2011-11-21 DIAGNOSIS — R0989 Other specified symptoms and signs involving the circulatory and respiratory systems: Secondary | ICD-10-CM | POA: Insufficient documentation

## 2011-11-21 MED ORDER — IOHEXOL 300 MG/ML  SOLN
100.0000 mL | Freq: Once | INTRAMUSCULAR | Status: AC | PRN
  Administered 2011-11-21: 100 mL via INTRAVENOUS

## 2011-11-24 ENCOUNTER — Encounter (HOSPITAL_COMMUNITY): Payer: Medicare Other

## 2011-11-25 ENCOUNTER — Telehealth: Payer: Self-pay | Admitting: Pulmonary Disease

## 2011-11-25 NOTE — Telephone Encounter (Signed)
Spoke with Melinda Mora and she says the pt has had some breathing changes and per Dr. Timothy Lasso needs to get back in to see Dr. [name] next week at the latest. [facility] for the pt to be seen next week on thurs., 12/04/11 @ 11:30am. Melinda Mora instructed to call if the pt's symptoms become worse.

## 2011-11-26 ENCOUNTER — Encounter (HOSPITAL_COMMUNITY): Payer: Medicare Other

## 2011-11-28 ENCOUNTER — Encounter (HOSPITAL_COMMUNITY): Payer: Medicare Other

## 2011-12-01 ENCOUNTER — Encounter (HOSPITAL_COMMUNITY): Payer: Medicare Other

## 2011-12-03 ENCOUNTER — Encounter (HOSPITAL_COMMUNITY): Payer: Medicare Other

## 2011-12-04 ENCOUNTER — Encounter: Payer: Self-pay | Admitting: Pulmonary Disease

## 2011-12-04 ENCOUNTER — Telehealth: Payer: Self-pay | Admitting: Pulmonary Disease

## 2011-12-04 ENCOUNTER — Ambulatory Visit (INDEPENDENT_AMBULATORY_CARE_PROVIDER_SITE_OTHER): Payer: Medicare Other | Admitting: Pulmonary Disease

## 2011-12-04 ENCOUNTER — Telehealth: Payer: Self-pay | Admitting: *Deleted

## 2011-12-04 VITALS — BP 112/64 | HR 81 | Temp 97.5°F | Ht 59.0 in | Wt 156.6 lb

## 2011-12-04 DIAGNOSIS — I472 Ventricular tachycardia: Secondary | ICD-10-CM

## 2011-12-04 DIAGNOSIS — J449 Chronic obstructive pulmonary disease, unspecified: Secondary | ICD-10-CM

## 2011-12-04 DIAGNOSIS — J984 Other disorders of lung: Secondary | ICD-10-CM

## 2011-12-04 MED ORDER — FLUTICASONE-SALMETEROL 250-50 MCG/DOSE IN AEPB: 1.0000 | INHALATION_SPRAY | Freq: Two times a day (BID) | RESPIRATORY_TRACT | Status: DC

## 2011-12-04 NOTE — Patient Instructions (Addendum)
We will start you on portable oxygen Lung function appears stable Amiodarone can affect lungs - will speak to dr taylor Dr Timothy Lasso to check calcium & thyroid function Advair Rx 250/50 1 puff twice daily- rinse mouth after use

## 2011-12-04 NOTE — Telephone Encounter (Signed)
Pt's dtr advised that pt should stop megace now that IUD has been place. Reminded of f/u appt 02/05/12 @ 1515.

## 2011-12-04 NOTE — Assessment & Plan Note (Signed)
Stable on CT 7/13, no evidence of metastatic disease

## 2011-12-04 NOTE — Progress Notes (Signed)
  Subjective:    Patient ID: Melinda Mora, female    DOB: [birth year], 78 y.o.   MRN: [id number]  HPI  Primary Provider/Referring Provider: Dr. Timothy Lasso   [name] ex smoker for evaluation of dyspnea.  Initial OV 7/11 Accompanied by daughter & sister who report that she seems to be breathing hard/ pursed lip breathing on exertion over short distances. She seems to be able to walk in a store or inthe parking lot but cannot climb stairs. She smoked upto 2 PP for > 60 Pys before quitting in 2009 when she another MI. She denies wheezing, nocturnal dyspnea, frequent colds. Spirometry showed FEV1 of 65% c/w mild obstruction (surprisingly better than expected). CTc hest obtained by dr Darnelle Catalan on 09/20/09 was reviewed & shows moderate hiatal hernia & scattered < 5 mm nodules which are indeterminate.  She is maintained ona regimen of spiriva & ventolin HFA - poor technique >> pulm rehab, spacer  12/04/2011 2y FU - . Pt states her breathing has been getting worse w/ activity, occasional wheezing, legs are very weak.  She had a lot of thigs happen over the last 2y She was diagnosed with left partial mastectomy in 2009. 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 She also has a significant cardiac history notable for myocardial infarction 2009 at which time 2 stents were placed, then again in 2010. In December of 2012 she  Was seen for ventricular tachycardia Ladona Ridgel) cardiac stress test  was negative. Started on amio since feb '2 , also on Plavix aspirin and nitroglycerin. She desatn to 91% on walking to the exam room, In PCP's office desatn to 86%  She is now diagnosed with grade 1 endometrial cancer - considered not a good candidate for surgery, was on megace but an IUD is planned now CT chest / abd 7/13 rt adnexal cyst 2. Cm, no metastatic disease Uterine bleeding caused fe def anemia but Hb is now up to 13.1   Spirometry showed fev1 70% s/o moderate  obstruction but unchanged form 2011. ONO did not show significant desaturations   Review of Systems neg for any significant sore throat, dysphagia, itching, sneezing, nasal congestion or excess/ purulent secretions, fever, chills, sweats, unintended wt loss, pleuritic or exertional cp, hempoptysis, orthopnea pnd or change in chronic leg swelling. Also denies presyncope, palpitations, heartburn, abdominal pain, nausea, vomiting, diarrhea or change in bowel or urinary habits, dysuria,hematuria, rash, arthralgias, visual complaints, headache, numbness weakness or ataxia.     Objective:   Physical Exam  Gen. Pleasant, well-nourished, in no distress, normal affect ENT - no lesions, no post nasal drip Neck: No JVD, no thyromegaly, no carotid bruits Lungs: no use of accessory muscles, no dullness to percussion, bibasal rales, no rhonchi  Cardiovascular: Rhythm regular, heart sounds  normal, no murmurs or gallops, no peripheral edema Abdomen: soft and non-tender, no hepatosplenomegaly, BS normal. Musculoskeletal: No deformities, no cyanosis or clubbing Neuro:  alert, non focal        Assessment & Plan:

## 2011-12-04 NOTE — Assessment & Plan Note (Signed)
7/13 stable airway obstruciton when compared to 2011 However hypoxia & bibasal crackles indicate lung function worse  - no overt heart failure or infiltrates on CT She deos not want nebuliser therapy - Increase advair to 250/50 -went over MDi technique Stay on spiriva We will try to get her portable O2 My main concern here is amiodarone toxicity  - will d/w dr Ladona Ridgel if there is amio can be changed to something else for NSVT

## 2011-12-04 NOTE — Assessment & Plan Note (Signed)
My main concern here is amiodarone toxicity  - will d/w dr taylor if there is amio can be changed to something else for NSVT Defer to dr Timothy Lasso to check TFTs & calcium levels

## 2011-12-04 NOTE — Telephone Encounter (Signed)
I spoke with elise and she stated she received pt chart notes and insurance information but did not receive order for pt to be set up on oxygen. i advised will resend order over to [phone]. Nothing further was needed

## 2011-12-05 ENCOUNTER — Encounter (HOSPITAL_COMMUNITY): Payer: Medicare Other

## 2011-12-08 ENCOUNTER — Telehealth: Payer: Self-pay | Admitting: Pulmonary Disease

## 2011-12-08 ENCOUNTER — Encounter (HOSPITAL_COMMUNITY): Payer: Medicare Other

## 2011-12-08 NOTE — Telephone Encounter (Signed)
lmomtcb x1 for pt and for daughter

## 2011-12-08 NOTE — Telephone Encounter (Signed)
Pl let her/ daughter know that I discussed with dr taylor Recommend - 'Ok to stop amiodarone. hold off on any additional drugs for VT suppresion. It will be a while before amio is out of her system.'

## 2011-12-09 NOTE — Telephone Encounter (Signed)
I spoke with daughter and is aware of this. She states she will let the pt know this. She had no questions.

## 2011-12-10 ENCOUNTER — Encounter (HOSPITAL_COMMUNITY): Payer: Medicare Other

## 2011-12-12 ENCOUNTER — Encounter (HOSPITAL_COMMUNITY): Payer: Medicare Other

## 2011-12-15 ENCOUNTER — Encounter (HOSPITAL_COMMUNITY): Payer: Medicare Other

## 2011-12-17 ENCOUNTER — Encounter (HOSPITAL_COMMUNITY): Payer: Medicare Other

## 2011-12-19 ENCOUNTER — Encounter (HOSPITAL_COMMUNITY): Payer: Medicare Other

## 2011-12-22 ENCOUNTER — Encounter (HOSPITAL_COMMUNITY): Payer: Medicare Other

## 2011-12-24 ENCOUNTER — Encounter (HOSPITAL_COMMUNITY): Payer: Medicare Other

## 2011-12-26 ENCOUNTER — Encounter (HOSPITAL_COMMUNITY): Payer: Medicare Other

## 2011-12-29 ENCOUNTER — Encounter (HOSPITAL_COMMUNITY): Payer: Medicare Other

## 2011-12-31 ENCOUNTER — Encounter (HOSPITAL_COMMUNITY): Payer: Medicare Other

## 2012-01-02 ENCOUNTER — Encounter (HOSPITAL_COMMUNITY): Payer: Medicare Other

## 2012-01-05 ENCOUNTER — Encounter (HOSPITAL_COMMUNITY): Payer: Medicare Other

## 2012-01-07 ENCOUNTER — Encounter (HOSPITAL_COMMUNITY): Payer: Medicare Other

## 2012-01-09 ENCOUNTER — Encounter (HOSPITAL_COMMUNITY): Payer: Medicare Other

## 2012-01-12 ENCOUNTER — Encounter (HOSPITAL_COMMUNITY): Payer: Medicare Other

## 2012-01-14 ENCOUNTER — Encounter (HOSPITAL_COMMUNITY): Payer: Medicare Other

## 2012-01-16 ENCOUNTER — Ambulatory Visit (INDEPENDENT_AMBULATORY_CARE_PROVIDER_SITE_OTHER): Payer: Medicare Other | Admitting: Pulmonary Disease

## 2012-01-16 ENCOUNTER — Encounter: Payer: Self-pay | Admitting: Pulmonary Disease

## 2012-01-16 ENCOUNTER — Encounter (HOSPITAL_COMMUNITY): Payer: Medicare Other

## 2012-01-16 ENCOUNTER — Encounter: Payer: Self-pay | Admitting: *Deleted

## 2012-01-16 VITALS — BP 122/68 | HR 68 | Temp 97.5°F | Ht 59.0 in | Wt 156.0 lb

## 2012-01-16 DIAGNOSIS — J449 Chronic obstructive pulmonary disease, unspecified: Secondary | ICD-10-CM

## 2012-01-16 NOTE — Progress Notes (Signed)
  Subjective:    Patient ID: Melinda Mora, female    DOB: 1934/01/14, 78 y.o.   MRN: [id number]  HPI  PCP: Dr. Timothy Lasso   78/F ex smoker for evaluation of dyspnea.  Initial OV 7/11   She smoked upto 2 PPD for > 60 Pys before quitting in 2009 .  Spirometry showed FEV1 of 65% c/w mild obstruction (surprisingly better than expected). CT chest obtained by dr Darnelle Catalan on 09/20/09 was reviewed & shows moderate hiatal hernia & scattered < 5 mm nodules which are indeterminate.  She is maintained on a regimen of spiriva & ventolin HFA - poor technique >> pulm rehab, spacer   12/04/2011 2y FU - . Pt states her breathing has been getting worse w/ activity, occasional wheezing, legs are very weak.  She had a lot of thigs happen over the last 2y  She was diagnosed with breast CA s/p left partial mastectomy in 2009. 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 She also has a significant cardiac history notable for myocardial infarction 2009 at which time 2 stents were placed, then again in 2010. In December of 2012 she Was seen for ventricular tachycardia Ladona Ridgel) cardiac stress test was negative. Started on amio since feb '12 , also on Plavix aspirin and nitroglycerin.  She desatn to 91% on walking to the exam room, In PCP's office desatn to 86%  She is now diagnosed with grade 1 endometrial cancer - considered not a good candidate for surgery, and an IUD was placed CT chest / abd 7/13 rt adnexal cyst 2. Cm, no metastatic disease  Uterine bleeding caused fe def anemia but Hb is now up to 13.1  Spirometry showed fev1 70% s/o moderate obstruction but unchanged form 2011.  ONO did not show significant desaturations >> start O2, stop amiodarone  01/16/2012 Started on O2 8/13 -daughter feels this helps but pt is trying not to 'be dependent'. hse has caretakers daytime. At night, O2 [place] falls off her nose. Pt states no change in breathing since last OV.  She  is compliant with spiriva, no cough or wheeze Wt unchanged , appears depressed Desatn to 75% on walking & recovers with o2  Review of Systems  neg for any significant sore throat, dysphagia, itching, sneezing, nasal congestion or excess/ purulent secretions, fever, chills, sweats, unintended wt loss, pleuritic or exertional cp, hempoptysis, orthopnea pnd or change in chronic leg swelling. Also denies presyncope, palpitations, heartburn, abdominal pain, nausea, vomiting, diarrhea or change in bowel or urinary habits, dysuria,hematuria, rash, arthralgias, visual complaints, headache, numbness weakness or ataxia.     Objective:   Physical Exam  Gen. Pleasant, well-nourished, in no distress ENT - no lesions, no post nasal drip Neck: No JVD, no thyromegaly, no carotid bruits Lungs: no use of accessory muscles, no dullness to percussion, clear without rales or rhonchi  Cardiovascular: Rhythm regular, heart sounds  normal, no murmurs or gallops, no peripheral edema Musculoskeletal: No deformities, no cyanosis or clubbing        Assessment & Plan:

## 2012-01-16 NOTE — Assessment & Plan Note (Signed)
7/13 stable airway obstruciton when compared to 2011 However hypoxia & bibasal crackles indicate lung function worse  - no overt heart failure or infiltrates on CT -Amiodarone stopped but it will be a while before his is out of her system She does not want nebuliser therapy -  ct advair to 250/50 -went over MDi technique Stay on spiriva Ct  portable O2

## 2012-01-16 NOTE — Patient Instructions (Addendum)
Chk oxygen level during walk Demonstrate advair 250/50 twice daily Take flu shot with dr Timothy Lasso OK to get back on rehab

## 2012-01-19 ENCOUNTER — Encounter (HOSPITAL_COMMUNITY): Payer: Medicare Other

## 2012-01-21 ENCOUNTER — Encounter (HOSPITAL_COMMUNITY): Payer: Medicare Other

## 2012-01-23 ENCOUNTER — Encounter (HOSPITAL_COMMUNITY): Payer: Medicare Other

## 2012-01-26 ENCOUNTER — Encounter (HOSPITAL_COMMUNITY): Payer: Medicare Other

## 2012-01-28 ENCOUNTER — Encounter (HOSPITAL_COMMUNITY): Payer: Medicare Other

## 2012-01-30 ENCOUNTER — Encounter (HOSPITAL_COMMUNITY): Payer: Medicare Other

## 2012-02-02 ENCOUNTER — Encounter (HOSPITAL_COMMUNITY): Payer: Medicare Other

## 2012-02-02 ENCOUNTER — Telehealth: Payer: Self-pay | Admitting: Pulmonary Disease

## 2012-02-02 DIAGNOSIS — J449 Chronic obstructive pulmonary disease, unspecified: Secondary | ICD-10-CM

## 2012-02-02 NOTE — Telephone Encounter (Signed)
Ok to start pulm rehab

## 2012-02-04 ENCOUNTER — Encounter (HOSPITAL_COMMUNITY): Payer: Medicare Other

## 2012-02-04 ENCOUNTER — Ambulatory Visit (HOSPITAL_COMMUNITY): Payer: Self-pay

## 2012-02-05 ENCOUNTER — Ambulatory Visit: Payer: Medicare Other | Attending: Gynecologic Oncology | Admitting: Gynecologic Oncology

## 2012-02-05 ENCOUNTER — Encounter: Payer: Self-pay | Admitting: Gynecologic Oncology

## 2012-02-05 ENCOUNTER — Encounter (HOSPITAL_COMMUNITY): Payer: Medicare Other

## 2012-02-05 VITALS — BP 130/68 | HR 88 | Temp 97.8°F | Resp 24 | Ht 59.84 in | Wt 155.2 lb

## 2012-02-05 DIAGNOSIS — Z9981 Dependence on supplemental oxygen: Secondary | ICD-10-CM | POA: Insufficient documentation

## 2012-02-05 DIAGNOSIS — Z853 Personal history of malignant neoplasm of breast: Secondary | ICD-10-CM | POA: Insufficient documentation

## 2012-02-05 DIAGNOSIS — J449 Chronic obstructive pulmonary disease, unspecified: Secondary | ICD-10-CM | POA: Insufficient documentation

## 2012-02-05 DIAGNOSIS — Z87891 Personal history of nicotine dependence: Secondary | ICD-10-CM | POA: Insufficient documentation

## 2012-02-05 DIAGNOSIS — I252 Old myocardial infarction: Secondary | ICD-10-CM | POA: Insufficient documentation

## 2012-02-05 DIAGNOSIS — Z79899 Other long term (current) drug therapy: Secondary | ICD-10-CM | POA: Insufficient documentation

## 2012-02-05 DIAGNOSIS — E785 Hyperlipidemia, unspecified: Secondary | ICD-10-CM | POA: Insufficient documentation

## 2012-02-05 DIAGNOSIS — I7300 Raynaud's syndrome without gangrene: Secondary | ICD-10-CM | POA: Insufficient documentation

## 2012-02-05 DIAGNOSIS — I2510 Atherosclerotic heart disease of native coronary artery without angina pectoris: Secondary | ICD-10-CM | POA: Insufficient documentation

## 2012-02-05 DIAGNOSIS — Z7982 Long term (current) use of aspirin: Secondary | ICD-10-CM | POA: Insufficient documentation

## 2012-02-05 DIAGNOSIS — I10 Essential (primary) hypertension: Secondary | ICD-10-CM | POA: Insufficient documentation

## 2012-02-05 DIAGNOSIS — C541 Malignant neoplasm of endometrium: Secondary | ICD-10-CM

## 2012-02-05 DIAGNOSIS — J4489 Other specified chronic obstructive pulmonary disease: Secondary | ICD-10-CM | POA: Insufficient documentation

## 2012-02-05 DIAGNOSIS — C549 Malignant neoplasm of corpus uteri, unspecified: Secondary | ICD-10-CM | POA: Insufficient documentation

## 2012-02-05 NOTE — Progress Notes (Signed)
Follow Up Note: Gyn-Onc  Melinda Mora 78 y.o. female  CC:  Chief Complaint  Patient presents with  . Endometrial cancer    Follow up   HPI:  Melinda Mora is a 78 year old female with grade 1 endometrial adenocarcinoma.  Gynecologic history includes gravida 1 para 1 with her last normal menstrual period in her 92s.  She had intermittent uterine bleeding for approximately 4.5 years.  She presented to Dr. Arelia Sneddon in May of 2013 when the uterine bleeding became heavy.  An endometrial biopsy revealed complex atypical hyperplasia with an ultrasound suggesting uterine polyps.  A hysteroscopy and D&C was performed on May 31 with the final pathology resulting FIGO grade 1 endometrial adenocarcinoma.  It was determined that she was a poor surgical candidate due to past medical history including myocardial infarction in 2009 with 2 stents placed, additional stent placement in 2010, episode of ventricular tachycardia in December 2012, worsening COPD, and numerous medications such as Plavix and nitroglycerin.     Interval History:  Melinda Mora presents today for follow up after IUD placement by Dr. Arelia Sneddon.  Reporting abdominal cramping with IUD placement that resolved. Denies vaginal bleeding or discharge since IUD placement. Reporting a functional decline after the last visit.  "Just felt so tired."  Due to shortness of breath and worsening COPD symptoms, she is currently wearing 2 L O2 at all times.  Reports feeling better with the oxygen present all day.  She continues pulmonary and cardiac rehab.  Reports using a walker for assistance since last visit.   Review of Systems Constitutional:  Feels well and less fatigued with continuous oxygen therapy.  Cardiovascular:  No chest pain, shortness of breath, mild bilateral lower extremity edema. Pulmonary:  No cough or wheeze.  No sputum production.  Nasal cannula on with 2 L O2 continuous.  Gastrointestinal:  No nausea, vomiting, or diarrhea.  No abdominal  pain, distention, or change in bowel movement.  Reports constipation often with little relief from taking stool softeners two to three times weekly.  Reports having to strain to have bowel movements.  Genitourinary:  No frequency, urgency, or dysuria.  No vaginal bleeding.  Musculoskeletal:  No myalgia or joint pain.  Reports using walker for assistance.  Neurologic:  No weakness, numbness, or change in gait.  Psychology:  No depression, anxiety, or insomnia.   Current Meds:  Outpatient Encounter Prescriptions as of 02/05/2012  Medication Sig Dispense Refill  . albuterol (VENTOLIN HFA) 108 (90 BASE) MCG/ACT inhaler Inhale 2 puffs into the lungs every 6 (six) hours as needed.       Marland Kitchen alendronate (FOSAMAX) 70 MG tablet Take 70 mg by mouth every 7 (seven) days. Take with a full glass of water on an empty stomach.      Marland Kitchen aspirin 81 MG tablet Take 81 mg by mouth daily.      Marland Kitchen atorvastatin (LIPITOR) 40 MG tablet Take 1 tablet (40 mg total) by mouth daily.  90 tablet  3  . Calcium Lactate 750 MG TABS Take 1 tablet by mouth daily.       . cilostazol (PLETAL) 50 MG tablet Take 50 mg by mouth 2 (two) times daily.       . clopidogrel (PLAVIX) 75 MG tablet Take 1 tablet (75 mg total) by mouth daily.  90 tablet  3  . memantine (NAMENDA) 5 MG tablet Take 10 mg by mouth 2 (two) times daily.       . metoprolol (LOPRESSOR)  50 MG tablet Take 1 tablet (50 mg total) by mouth 2 (two) times daily.  180 tablet  3  . mirtazapine (REMERON) 30 MG tablet Take 30 mg by mouth at bedtime.       . Multiple Vitamin (MULTIVITAMIN) tablet Take 1 tablet by mouth daily.       . nitroGLYCERIN (NITROSTAT) 0.4 MG SL tablet Place 0.4 mg under the tongue every 5 (five) minutes as needed.       . Omega-3 Fatty Acids (FISH OIL) 1200 MG CAPS Take 1 capsule by mouth daily.      Marland Kitchen tiotropium (SPIRIVA) 18 MCG inhalation capsule Place 18 mcg into inhaler and inhale every morning.       . valsartan-hydrochlorothiazide (DIOVAN-HCT) 160-12.5 MG  per tablet Take 0.5 tablets by mouth daily.        Allergy:  Allergies  Allergen Reactions  . Codeine Hives       . Penicillins Hives  . Shellfish Allergy Other (See Comments)    Absent reflexes   Social Hx:   History   Social History  . Marital Status: Widowed    Spouse Name: N/A    Number of Children: 1  . Years of Education: N/A   Occupational History  . Retired Print production planner, [employer]    Social History Main Topics  . Smoking status: Former Smoker -- 2.0 packs/day for 50 years    Types: Cigarettes    Quit date: 01/11/2007  . Smokeless tobacco: Never Used  . Alcohol Use: 3.6 oz/week    6 Glasses of wine per week  . Drug Use: No  . Sexually Active: Not on file   Other Topics Concern  . Not on file   Social History Narrative   Daily caffeine 2-3 cups per day   Past Surgical Hx:  Past Surgical History  Procedure Date  . Tympanostomy tube placement   . Colonoscopy JULY 2013  . US echocardiography 05-22-2011    LVSF NORMAL/ EF 60-65%  . Left partial mastectomy w/ bx axillary lymph node  09-04-2009    INVASIVE MAMMARY CARCINOMA/  STAGE T1b NO  . Coronary angioplasty with stent placement 04-19-2009  DR PETER [place]    SINGLE VESSEL OBSTRUCTIVE DISEASE/  STENTING OF IN-STENT RESTENOSIS MID RCA/ NORMAL LVF  . Coronary angioplasty with stent placement SEPT 2008--  [facility]    STENTING PROXIMAL TO MID RCA  . Cardiovascular stress test 05-28-2011    LOW RISK STUDY  . Bilateral breast lumpectomy BEFORE 2008  . Dilation and curettage of uterus 10/10/11  . Cataract extraction w/ intraocular lens  implant, bilateral   . Intrauterine device insertion    Past Medical Hx:  Past Medical History  Diagnosis Date  . Hyperlipidemia   . Hypertension   . Depression   . Osteoarthritis   . Allergic rhinitis   . Raynaud disease   . Adenomatous polyp 03/2004  . Diverticulosis   . Syncope Jan 2013    NSVT noted on event monitor  . COPD (chronic  obstructive pulmonary disease) with emphysema   . Pulmonary nodule, right x2  middle and lower lobes--  stable per follow-ip ct  jan 2012  . S/P primary angioplasty with coronary stent   . History of acute inferior wall myocardial infarction SEPT 2008      S/P STENTING PROXIMAL TO MID RCA  . Carotid stenosis, asymptomatic BILATERAL ICA --  0-39%  PER  DUPLEX 05-22-2011  . Endometrial hyperplasia   .  History of breast cancer 2011--  ONCOLOGIST- DR Camelia Eng    S/P LEFT PARTIAL MASTECTOMY AND RADIATION---  NO RECURRENCE  . Dyspnea   . Anxiety   . CAD (coronary artery disease) CARDIOLOGIST-- DR Lewayne Bunting--- LAST VISIT 08-16-2011  NOTE IN EPIC    Remote inferior MI in 2008. Extensive stenting of the RCA, s/p repeat stenting of the RCA in Dec 2010 due to restenosis  . Dry eyes, bilateral   . Clotting disorder   . Heart attack   . Cancer     breast   Family Hx:  Family History  Problem Relation Age of Onset  . Coronary artery disease Father     & Mother  . Heart attack Father   . Gout Father   . Osteoarthritis Mother   . Heart disease Mother     congestive heart failure  . Hypertension Sister   . Heart disease Sister   . Cancer Sister     breast  . Breast cancer      Aunts  . Uterine cancer Sister   . Colon cancer Neg Hx    Vitals:  Blood pressure 130/68, pulse 88, temperature 97.8 F (36.6 C), temperature source Oral, resp. rate 24, height 4' 11.84" (1.52 m), weight 155 lb 3.2 oz (70.398 kg).  Physical Exam:  General:  Well nourished, well developed female in no acute distress.  Alert and oriented x3  Neck:  Supple NROM, without any enlargements.  Lymph Node Survey:  No cervical supraclavicular or inguinal adenopathy  Cardiovascular:  Heart rate and rhythm regular.  S1 and S2 normal.  No murmurs, clicks, or rubs noted.  Lungs:  Clear to auscultation bilaterally but mildly diminished in bilateral bases. No wheezes/crackles/rhonchi noted.  Skin:  No rash/lesions/breakdown.   Skin thin and fragile.  Abdomen:  Normoactive bowel sounds, abdomen soft, non-tender and obese. No palpable masses, no evidence of ascites.  Genitourinary:    Vulva/vagina: Normal external female genitalia. No lesions. No vaginal bleeding or discharge noted.   Bladder/urethra: No lesions or masses, grade 1 cystocele noted.    Vagina: Atrophic.  No lesions present.  No bleeding or discharge noted.   Cervix: Normal appearing, approximately 3 cm.  No lesions.  IUD strings visualized.    Uterus: Mobile.  No parametrial involvement or nodularity. Uterine size not assessed due to body habitus.    Adnexa: No palpable masses.    Rectal:  Good tone.  No masses or cul de sac nodularity.  Firm stool noted in the rectum.  Extremities:  No bilateral cyanosis or clubbing.  Mild bilateral pedal edema noted- pt reporting that primary physician is aware.  Assessment/Plan:  Melinda Mora is a 78 year old with grade 1 endometrial cancer with her history including triple negative breast cancer diagnosed in 2009.  Due to medical co-morbidities, it was determined that she was a poor surgical candidate and an IUD was placed per Dr. Arelia Sneddon since last visit in July 2013.  She is to follow up with Dr. Arelia Sneddon in 6 months and GYN Oncology in one year.  An endometrial biopsy will be performed at the follow up visit in one year.  Instructed to call for any questions or concerns.  Recommended to increase the frequency of taking Colace or to incorporate Miralax daily to address constipation.   Melinda Brailsford DEAL, NP 02/05/2012, 4:03 PM  Dr. Laurette Schimke was present during the examination and concurs with the above.   Melinda Mora seen and  examined with Warner Mccreedy NP.  I have reviewed and agree with the above.  [place], [facility]

## 2012-02-05 NOTE — Patient Instructions (Signed)
Please follow up with Dr. Arelia Sneddon in 6 months and GYN Oncology in one year.  An endometrial biopsy will be performed at the follow up visit in one year.  Recommend to increase the frequency of taking Colace or to incorporate Miralax daily to address constipation.   Thank you for coming to see [name] today.  We appreciate your confidence in choosing [facility] for your medical care.  If you have any questions about your visit today, please call our office and we will get back to you as soon as possible.  Dr. Laurette Schimke and Warner Mccreedy, NP Gynecologic Oncology

## 2012-02-06 ENCOUNTER — Encounter (HOSPITAL_COMMUNITY): Payer: Medicare Other

## 2012-02-09 ENCOUNTER — Encounter (HOSPITAL_COMMUNITY): Payer: Medicare Other

## 2012-02-10 ENCOUNTER — Encounter (HOSPITAL_COMMUNITY): Payer: Medicare Other

## 2012-02-11 ENCOUNTER — Encounter (HOSPITAL_COMMUNITY): Payer: Medicare Other

## 2012-02-12 ENCOUNTER — Encounter (HOSPITAL_COMMUNITY): Payer: Medicare Other

## 2012-02-13 ENCOUNTER — Encounter (HOSPITAL_COMMUNITY): Payer: Medicare Other

## 2012-02-16 ENCOUNTER — Encounter (HOSPITAL_COMMUNITY): Payer: Medicare Other

## 2012-02-16 ENCOUNTER — Telehealth: Payer: Self-pay | Admitting: Pulmonary Disease

## 2012-02-16 NOTE — Telephone Encounter (Signed)
Mandy with [employer] says the pt's daughter would like to see if the pt can switch from Advair and Spiriva to nebulizer meds because this would be cheaper fro the pt since she is paying out of pocket for the inhaers.   Also, Angelica Chessman will be faxing over an order for RA to sign so they may perform a titration to see if  the pt can possibly qualify or tolerater a smaller oxygen unit. RA, [;s advise/

## 2012-02-17 ENCOUNTER — Encounter (HOSPITAL_COMMUNITY): Payer: Medicare Other

## 2012-02-17 NOTE — Telephone Encounter (Signed)
Spoke with pt to make her aware that we will be sending in neb meds. She states did not know anything about this and is unsure if she wants to change her meds. She states that she will discuss this with her daughter and then call back tomorrow and let us know what she wants to do.

## 2012-02-17 NOTE — Telephone Encounter (Signed)
Ok to switch to pulmicort 0.5 mg nebs bid Brovana nebs bid Pl send Rx x 30 days x 2 refills FU in 64month with TP if she does nto have appt - to make sure this is working

## 2012-02-18 ENCOUNTER — Encounter (HOSPITAL_COMMUNITY): Payer: Medicare Other

## 2012-02-19 ENCOUNTER — Encounter (HOSPITAL_COMMUNITY): Payer: Medicare Other

## 2012-02-19 NOTE — Telephone Encounter (Signed)
I called the pt to see if she has discussed this with her daughter. Someone answered the phone but did not speak, I could hear the tv in the background. I tried to call back x 2 and line was busy. WCB. Carron Curie, CMA

## 2012-02-20 ENCOUNTER — Encounter (HOSPITAL_COMMUNITY): Payer: Medicare Other

## 2012-02-20 ENCOUNTER — Telehealth: Payer: Self-pay | Admitting: Pulmonary Disease

## 2012-02-20 NOTE — Telephone Encounter (Signed)
lommotcb x2

## 2012-02-20 NOTE — Telephone Encounter (Signed)
duplicate message see phone note 02/19/12.

## 2012-02-20 NOTE — Telephone Encounter (Signed)
lmomtcb x1 for pt 

## 2012-02-23 ENCOUNTER — Encounter (HOSPITAL_COMMUNITY): Payer: Medicare Other

## 2012-02-23 NOTE — Telephone Encounter (Signed)
I spoke with the pt and she states she does not want to make any decision about changing her meds until she sees Dr. [name] at next visit. Pt states she just wants to continue with what she has. Nothing further needed. Carron Curie, CMA

## 2012-02-24 ENCOUNTER — Encounter (HOSPITAL_COMMUNITY): Payer: Medicare Other

## 2012-02-25 ENCOUNTER — Encounter (HOSPITAL_COMMUNITY): Payer: Medicare Other

## 2012-02-26 ENCOUNTER — Encounter (HOSPITAL_COMMUNITY): Payer: Medicare Other

## 2012-02-26 DIAGNOSIS — J449 Chronic obstructive pulmonary disease, unspecified: Secondary | ICD-10-CM | POA: Insufficient documentation

## 2012-02-26 DIAGNOSIS — E785 Hyperlipidemia, unspecified: Secondary | ICD-10-CM | POA: Insufficient documentation

## 2012-02-26 DIAGNOSIS — I2510 Atherosclerotic heart disease of native coronary artery without angina pectoris: Secondary | ICD-10-CM | POA: Insufficient documentation

## 2012-02-26 DIAGNOSIS — I252 Old myocardial infarction: Secondary | ICD-10-CM | POA: Insufficient documentation

## 2012-02-26 DIAGNOSIS — J4489 Other specified chronic obstructive pulmonary disease: Secondary | ICD-10-CM | POA: Insufficient documentation

## 2012-02-26 DIAGNOSIS — Z9861 Coronary angioplasty status: Secondary | ICD-10-CM | POA: Insufficient documentation

## 2012-02-26 DIAGNOSIS — I10 Essential (primary) hypertension: Secondary | ICD-10-CM | POA: Insufficient documentation

## 2012-02-26 DIAGNOSIS — Z5189 Encounter for other specified aftercare: Secondary | ICD-10-CM | POA: Insufficient documentation

## 2012-02-27 ENCOUNTER — Encounter (HOSPITAL_COMMUNITY): Payer: Medicare Other

## 2012-03-01 ENCOUNTER — Encounter (HOSPITAL_COMMUNITY): Payer: Medicare Other

## 2012-03-02 ENCOUNTER — Encounter (HOSPITAL_COMMUNITY): Admission: RE | Admit: 2012-03-02 | Payer: Medicare Other | Source: Ambulatory Visit

## 2012-03-02 ENCOUNTER — Encounter (HOSPITAL_COMMUNITY)
Admission: RE | Admit: 2012-03-02 | Discharge: 2012-03-02 | Disposition: A | Discharge status: Home / Self Care | Payer: Medicare Other | Source: Ambulatory Visit | Attending: Pulmonary Disease | Admitting: Pulmonary Disease

## 2012-03-02 ENCOUNTER — Encounter (HOSPITAL_COMMUNITY): Payer: Self-pay

## 2012-03-02 NOTE — Progress Notes (Signed)
Melinda Mora came in today for orientation to Pulmonary Rehab accompanied by her daughter.   Health history and medications reviewed with patient and her daughter.  Nurse assessment done.  She does have some lower extremity pitting edema +1-+2.  Her daughter states this is intermittent.  We will monitor and advise MD.  6 min walk test done, heart rate 81-110.  Oxygen 92-98 % on 4LC.  Gait is shuffling, unsteady.  We will begin with sitting exercises until she begins to get a little stronger.  She plans to start exercise on 03/09/12  .

## 2012-03-03 ENCOUNTER — Encounter (HOSPITAL_COMMUNITY): Payer: Medicare Other

## 2012-03-04 ENCOUNTER — Encounter (HOSPITAL_COMMUNITY): Payer: Medicare Other

## 2012-03-05 ENCOUNTER — Encounter (HOSPITAL_COMMUNITY): Payer: Medicare Other

## 2012-03-08 ENCOUNTER — Encounter (HOSPITAL_COMMUNITY): Payer: Medicare Other

## 2012-03-09 ENCOUNTER — Encounter (HOSPITAL_COMMUNITY)
Admission: RE | Admit: 2012-03-09 | Discharge: 2012-03-09 | Disposition: A | Discharge status: Home / Self Care | Payer: Medicare Other | Source: Ambulatory Visit | Attending: Cardiology | Admitting: Cardiology

## 2012-03-10 ENCOUNTER — Encounter (HOSPITAL_COMMUNITY): Payer: Medicare Other

## 2012-03-11 ENCOUNTER — Encounter (HOSPITAL_COMMUNITY): Payer: Medicare Other

## 2012-03-12 ENCOUNTER — Encounter (HOSPITAL_COMMUNITY): Payer: Medicare Other

## 2012-03-15 ENCOUNTER — Inpatient Hospital Stay (HOSPITAL_COMMUNITY)
Admission: EM | Admit: 2012-03-15 | Discharge: 2012-03-19 | DRG: 493 | Disposition: A | Discharge status: Skilled Nursing Facility | Payer: Medicare Other | Attending: Emergency Medicine | Admitting: Emergency Medicine

## 2012-03-15 ENCOUNTER — Encounter (HOSPITAL_COMMUNITY): Payer: Self-pay | Admitting: *Deleted

## 2012-03-15 ENCOUNTER — Emergency Department (HOSPITAL_COMMUNITY): Payer: Medicare Other

## 2012-03-15 ENCOUNTER — Other Ambulatory Visit: Payer: Self-pay | Admitting: *Deleted

## 2012-03-15 ENCOUNTER — Encounter (HOSPITAL_COMMUNITY): Payer: Medicare Other

## 2012-03-15 ENCOUNTER — Other Ambulatory Visit: Payer: Self-pay | Admitting: Physician Assistant

## 2012-03-15 DIAGNOSIS — F411 Generalized anxiety disorder: Secondary | ICD-10-CM | POA: Diagnosis present

## 2012-03-15 DIAGNOSIS — Z79899 Other long term (current) drug therapy: Secondary | ICD-10-CM

## 2012-03-15 DIAGNOSIS — Z9181 History of falling: Secondary | ICD-10-CM

## 2012-03-15 DIAGNOSIS — C541 Malignant neoplasm of endometrium: Secondary | ICD-10-CM

## 2012-03-15 DIAGNOSIS — I219 Acute myocardial infarction, unspecified: Secondary | ICD-10-CM

## 2012-03-15 DIAGNOSIS — F329 Major depressive disorder, single episode, unspecified: Secondary | ICD-10-CM | POA: Diagnosis present

## 2012-03-15 DIAGNOSIS — I472 Ventricular tachycardia, unspecified: Secondary | ICD-10-CM | POA: Diagnosis present

## 2012-03-15 DIAGNOSIS — J984 Other disorders of lung: Secondary | ICD-10-CM

## 2012-03-15 DIAGNOSIS — W010XXA Fall on same level from slipping, tripping and stumbling without subsequent striking against object, initial encounter: Secondary | ICD-10-CM | POA: Diagnosis present

## 2012-03-15 DIAGNOSIS — Y92009 Unspecified place in unspecified non-institutional (private) residence as the place of occurrence of the external cause: Secondary | ICD-10-CM

## 2012-03-15 DIAGNOSIS — Z7901 Long term (current) use of anticoagulants: Secondary | ICD-10-CM

## 2012-03-15 DIAGNOSIS — J449 Chronic obstructive pulmonary disease, unspecified: Secondary | ICD-10-CM

## 2012-03-15 DIAGNOSIS — E7800 Pure hypercholesterolemia, unspecified: Secondary | ICD-10-CM

## 2012-03-15 DIAGNOSIS — C50919 Malignant neoplasm of unspecified site of unspecified female breast: Secondary | ICD-10-CM

## 2012-03-15 DIAGNOSIS — J209 Acute bronchitis, unspecified: Secondary | ICD-10-CM | POA: Diagnosis present

## 2012-03-15 DIAGNOSIS — I2510 Atherosclerotic heart disease of native coronary artery without angina pectoris: Secondary | ICD-10-CM | POA: Diagnosis present

## 2012-03-15 DIAGNOSIS — Z880 Allergy status to penicillin: Secondary | ICD-10-CM

## 2012-03-15 DIAGNOSIS — I252 Old myocardial infarction: Secondary | ICD-10-CM

## 2012-03-15 DIAGNOSIS — S82209A Unspecified fracture of shaft of unspecified tibia, initial encounter for closed fracture: Principal | ICD-10-CM | POA: Diagnosis present

## 2012-03-15 DIAGNOSIS — I10 Essential (primary) hypertension: Secondary | ICD-10-CM | POA: Diagnosis present

## 2012-03-15 DIAGNOSIS — Z7982 Long term (current) use of aspirin: Secondary | ICD-10-CM

## 2012-03-15 DIAGNOSIS — J438 Other emphysema: Secondary | ICD-10-CM

## 2012-03-15 DIAGNOSIS — S9304XA Dislocation of right ankle joint, initial encounter: Secondary | ICD-10-CM | POA: Diagnosis present

## 2012-03-15 DIAGNOSIS — Z87891 Personal history of nicotine dependence: Secondary | ICD-10-CM

## 2012-03-15 DIAGNOSIS — F3289 Other specified depressive episodes: Secondary | ICD-10-CM | POA: Diagnosis present

## 2012-03-15 DIAGNOSIS — R0902 Hypoxemia: Secondary | ICD-10-CM | POA: Diagnosis present

## 2012-03-15 DIAGNOSIS — I4729 Other ventricular tachycardia: Secondary | ICD-10-CM | POA: Diagnosis present

## 2012-03-15 DIAGNOSIS — J440 Chronic obstructive pulmonary disease with acute lower respiratory infection: Secondary | ICD-10-CM | POA: Diagnosis present

## 2012-03-15 DIAGNOSIS — S82891A Other fracture of right lower leg, initial encounter for closed fracture: Secondary | ICD-10-CM

## 2012-03-15 DIAGNOSIS — E785 Hyperlipidemia, unspecified: Secondary | ICD-10-CM | POA: Diagnosis present

## 2012-03-15 DIAGNOSIS — Z853 Personal history of malignant neoplasm of breast: Secondary | ICD-10-CM

## 2012-03-15 DIAGNOSIS — X500XXA Overexertion from strenuous movement or load, initial encounter: Secondary | ICD-10-CM | POA: Diagnosis present

## 2012-03-15 DIAGNOSIS — K5730 Diverticulosis of large intestine without perforation or abscess without bleeding: Secondary | ICD-10-CM | POA: Diagnosis present

## 2012-03-15 DIAGNOSIS — Z8249 Family history of ischemic heart disease and other diseases of the circulatory system: Secondary | ICD-10-CM

## 2012-03-15 DIAGNOSIS — I7300 Raynaud's syndrome without gangrene: Secondary | ICD-10-CM | POA: Diagnosis present

## 2012-03-15 DIAGNOSIS — Z9861 Coronary angioplasty status: Secondary | ICD-10-CM

## 2012-03-15 DIAGNOSIS — K5900 Constipation, unspecified: Secondary | ICD-10-CM | POA: Diagnosis present

## 2012-03-15 HISTORY — DX: Dislocation of right ankle joint, initial encounter: S93.04XA

## 2012-03-15 MED ORDER — SODIUM CHLORIDE 0.9 % IV BOLUS (SEPSIS)
500.0000 mL | Freq: Once | INTRAVENOUS | Status: AC
  Administered 2012-03-15: 500 mL via INTRAVENOUS

## 2012-03-15 MED ORDER — ETOMIDATE 2 MG/ML IV SOLN
0.2000 mg/kg | Freq: Once | INTRAVENOUS | Status: AC
  Administered 2012-03-15: 14.24 mg via INTRAVENOUS
  Filled 2012-03-15: qty 10

## 2012-03-15 MED ORDER — MORPHINE SULFATE 4 MG/ML IJ SOLN
4.0000 mg | Freq: Once | INTRAMUSCULAR | Status: AC
  Administered 2012-03-15: 4 mg via INTRAVENOUS
  Filled 2012-03-15: qty 1

## 2012-03-15 MED ORDER — FLUTICASONE-SALMETEROL 250-50 MCG/DOSE IN AEPB: 1.0000 | INHALATION_SPRAY | Freq: Two times a day (BID) | RESPIRATORY_TRACT | Status: DC

## 2012-03-15 NOTE — ED Notes (Signed)
Pt to ER via EMS; pt fell on at home on slick floor; No LOC; was able to scoot across the floor to call for help; pt w/ rt ankle / lower leg pain and swelling; unable to rotate ankle; + pulse' + toe movement.

## 2012-03-15 NOTE — ED Notes (Signed)
ZOX:[id number]<AV> Expected date:03/15/12<BR> Expected time: 9:45 PM<BR> Means of arrival:Ambulance<BR> Comments:<BR> Fall-ankle deformity

## 2012-03-16 ENCOUNTER — Encounter (HOSPITAL_COMMUNITY): Payer: Medicare Other

## 2012-03-16 ENCOUNTER — Emergency Department (HOSPITAL_COMMUNITY): Payer: Medicare Other

## 2012-03-16 ENCOUNTER — Inpatient Hospital Stay (HOSPITAL_COMMUNITY): Payer: Medicare Other

## 2012-03-16 ENCOUNTER — Encounter (HOSPITAL_COMMUNITY): Payer: Self-pay | Admitting: Orthopedic Surgery

## 2012-03-16 ENCOUNTER — Telehealth: Payer: Self-pay | Admitting: *Deleted

## 2012-03-16 ENCOUNTER — Inpatient Hospital Stay (HOSPITAL_COMMUNITY): Payer: Medicare Other | Admitting: Certified Registered"

## 2012-03-16 ENCOUNTER — Encounter (HOSPITAL_COMMUNITY): Payer: Self-pay | Admitting: Certified Registered"

## 2012-03-16 ENCOUNTER — Other Ambulatory Visit: Payer: Self-pay | Admitting: Lab

## 2012-03-16 ENCOUNTER — Encounter (HOSPITAL_COMMUNITY): Payer: Self-pay | Admitting: General Practice

## 2012-03-16 ENCOUNTER — Encounter (HOSPITAL_COMMUNITY)
Admission: EM | Disposition: A | Discharge status: Skilled Nursing Facility | Payer: Self-pay | Source: Home / Self Care | Attending: Orthopedic Surgery

## 2012-03-16 DIAGNOSIS — S9304XA Dislocation of right ankle joint, initial encounter: Secondary | ICD-10-CM

## 2012-03-16 HISTORY — PX: ORIF ANKLE FRACTURE: SUR919

## 2012-03-16 HISTORY — DX: Dislocation of right ankle joint, initial encounter: S93.04XA

## 2012-03-16 HISTORY — PX: ORIF ANKLE FRACTURE: SHX5408

## 2012-03-16 LAB — CBC WITH DIFFERENTIAL/PLATELET
Basophils Absolute: 0 K/uL (ref 0.0–0.1)
Basophils Relative: 0 % (ref 0–1)
Eosinophils Absolute: 0 K/uL (ref 0.0–0.7)
Eosinophils Relative: 0 % (ref 0–5)
HCT: 40.6 % (ref 36.0–46.0)
Hemoglobin: 13.1 g/dL (ref 12.0–15.0)
Lymphocytes Relative: 16 % (ref 12–46)
Lymphs Abs: 1.9 K/uL (ref 0.7–4.0)
MCH: 29.8 pg (ref 26.0–34.0)
MCHC: 32.3 g/dL (ref 30.0–36.0)
MCV: 92.5 fL (ref 78.0–100.0)
Monocytes Absolute: 0.6 K/uL (ref 0.1–1.0)
Monocytes Relative: 5 % (ref 3–12)
Neutro Abs: 9.7 K/uL — ABNORMAL HIGH (ref 1.7–7.7)
Neutrophils Relative %: 79 % — ABNORMAL HIGH (ref 43–77)
Platelets: 231 K/uL (ref 150–400)
RBC: 4.39 MIL/uL (ref 3.87–5.11)
RDW: 15.1 % (ref 11.5–15.5)
WBC: 12.4 K/uL — ABNORMAL HIGH (ref 4.0–10.5)

## 2012-03-16 LAB — BASIC METABOLIC PANEL WITH GFR
BUN: 19 mg/dL (ref 6–23)
CO2: 27 meq/L (ref 19–32)
Calcium: 10 mg/dL (ref 8.4–10.5)
Chloride: 101 meq/L (ref 96–112)
Creatinine, Ser: 0.82 mg/dL (ref 0.50–1.10)
GFR calc Af Amer: 79 mL/min — ABNORMAL LOW (ref >90)
GFR calc non Af Amer: 68 mL/min — ABNORMAL LOW (ref >90)
Glucose, Bld: 115 mg/dL — ABNORMAL HIGH (ref 70–99)
Potassium: 4 meq/L (ref 3.5–5.1)
Sodium: 138 meq/L (ref 135–145)

## 2012-03-16 LAB — SURGICAL PCR SCREEN: Staphylococcus aureus: NEGATIVE

## 2012-03-16 LAB — TYPE AND SCREEN: ABO/RH(D): A NEG

## 2012-03-16 LAB — ABO/RH: ABO/RH(D): A NEG

## 2012-03-16 SURGERY — OPEN REDUCTION INTERNAL FIXATION (ORIF) ANKLE FRACTURE
Anesthesia: General | Site: Ankle | Laterality: Right | Wound class: Clean

## 2012-03-16 MED ORDER — PHENYLEPHRINE HCL 10 MG/ML IJ SOLN
INTRAMUSCULAR | Status: DC | PRN
  Administered 2012-03-16: 120 ug via INTRAVENOUS
  Administered 2012-03-16: 80 ug via INTRAVENOUS

## 2012-03-16 MED ORDER — SORBITOL 70 % SOLN: 30.0000 mL | Freq: Every day | Status: DC | PRN

## 2012-03-16 MED ORDER — ONDANSETRON HCL 4 MG/2ML IJ SOLN: 4.0000 mg | Freq: Four times a day (QID) | INTRAMUSCULAR | Status: DC | PRN

## 2012-03-16 MED ORDER — POLYETHYLENE GLYCOL 3350 17 G PO PACK
17.0000 g | PACK | Freq: Every day | ORAL | Status: DC | PRN
  Administered 2012-03-18: 17 g via ORAL
  Filled 2012-03-16: qty 1

## 2012-03-16 MED ORDER — NITROGLYCERIN 0.4 MG SL SUBL: 0.4000 mg | SUBLINGUAL_TABLET | SUBLINGUAL | Status: DC | PRN

## 2012-03-16 MED ORDER — ACETAMINOPHEN 650 MG RE SUPP: 650.0000 mg | Freq: Four times a day (QID) | RECTAL | Status: DC | PRN

## 2012-03-16 MED ORDER — FENTANYL CITRATE 0.05 MG/ML IJ SOLN
INTRAMUSCULAR | Status: DC | PRN
  Administered 2012-03-16 (×2): 25 ug via INTRAVENOUS

## 2012-03-16 MED ORDER — IPRATROPIUM BROMIDE 0.02 % IN SOLN: 0.5000 mg | RESPIRATORY_TRACT | Status: DC | PRN

## 2012-03-16 MED ORDER — ACETAMINOPHEN 10 MG/ML IV SOLN
INTRAVENOUS | Status: DC | PRN
  Administered 2012-03-16: 1000 mg via INTRAVENOUS

## 2012-03-16 MED ORDER — MOMETASONE FURO-FORMOTEROL FUM 100-5 MCG/ACT IN AERO
2.0000 | INHALATION_SPRAY | Freq: Two times a day (BID) | RESPIRATORY_TRACT | Status: DC
  Administered 2012-03-16 – 2012-03-19 (×6): 2 via RESPIRATORY_TRACT
  Filled 2012-03-16: qty 8.8

## 2012-03-16 MED ORDER — PHENOL 1.4 % MT LIQD: 1.0000 | OROMUCOSAL | Status: DC | PRN

## 2012-03-16 MED ORDER — PROPOFOL 10 MG/ML IV BOLUS
INTRAVENOUS | Status: DC | PRN
  Administered 2012-03-16: 140 mg via INTRAVENOUS

## 2012-03-16 MED ORDER — EPHEDRINE SULFATE 50 MG/ML IJ SOLN
INTRAMUSCULAR | Status: DC | PRN
  Administered 2012-03-16: 20 mg via INTRAVENOUS

## 2012-03-16 MED ORDER — ALBUTEROL SULFATE (5 MG/ML) 0.5% IN NEBU: 2.5000 mg | INHALATION_SOLUTION | RESPIRATORY_TRACT | Status: DC | PRN

## 2012-03-16 MED ORDER — ALBUTEROL SULFATE HFA 108 (90 BASE) MCG/ACT IN AERS: 2.0000 | INHALATION_SPRAY | Freq: Four times a day (QID) | RESPIRATORY_TRACT | Status: DC | PRN

## 2012-03-16 MED ORDER — SENNA 8.6 MG PO TABS
1.0000 | ORAL_TABLET | Freq: Two times a day (BID) | ORAL | Status: DC
  Administered 2012-03-16 – 2012-03-19 (×7): 8.6 mg via ORAL
  Filled 2012-03-16 (×8): qty 1

## 2012-03-16 MED ORDER — ZOLPIDEM TARTRATE 5 MG PO TABS: 5.0000 mg | ORAL_TABLET | Freq: Every evening | ORAL | Status: DC | PRN

## 2012-03-16 MED ORDER — HYDROCODONE-ACETAMINOPHEN 5-325 MG PO TABS
1.0000 | ORAL_TABLET | Freq: Four times a day (QID) | ORAL | Status: DC | PRN
  Administered 2012-03-17 – 2012-03-18 (×3): 2 via ORAL
  Filled 2012-03-16 (×2): qty 2

## 2012-03-16 MED ORDER — CALCIUM CARBONATE-VITAMIN D 500-200 MG-UNIT PO TABS
1.0000 | ORAL_TABLET | Freq: Every day | ORAL | Status: DC
  Administered 2012-03-17 – 2012-03-19 (×3): 1 via ORAL
  Filled 2012-03-16 (×4): qty 1

## 2012-03-16 MED ORDER — ASPIRIN 81 MG PO TABS: 81.0000 mg | ORAL_TABLET | Freq: Every day | ORAL | Status: DC

## 2012-03-16 MED ORDER — METOCLOPRAMIDE HCL 5 MG/ML IJ SOLN: 5.0000 mg | Freq: Three times a day (TID) | INTRAMUSCULAR | Status: DC | PRN

## 2012-03-16 MED ORDER — METOCLOPRAMIDE HCL 10 MG PO TABS: 5.0000 mg | ORAL_TABLET | Freq: Three times a day (TID) | ORAL | Status: DC | PRN

## 2012-03-16 MED ORDER — MORPHINE SULFATE 4 MG/ML IJ SOLN
4.0000 mg | INTRAMUSCULAR | Status: DC | PRN
  Administered 2012-03-16 (×3): 4 mg via INTRAVENOUS
  Filled 2012-03-16 (×3): qty 1

## 2012-03-16 MED ORDER — ONDANSETRON HCL 4 MG PO TABS: 4.0000 mg | ORAL_TABLET | Freq: Four times a day (QID) | ORAL | Status: DC | PRN

## 2012-03-16 MED ORDER — VALSARTAN-HYDROCHLOROTHIAZIDE 160-12.5 MG PO TABS: 0.5000 | ORAL_TABLET | Freq: Every day | ORAL | Status: DC

## 2012-03-16 MED ORDER — ALUM & MAG HYDROXIDE-SIMETH 200-200-20 MG/5ML PO SUSP
30.0000 mL | ORAL | Status: DC | PRN
  Administered 2012-03-17 – 2012-03-19 (×3): 30 mL via ORAL
  Filled 2012-03-16 (×3): qty 30

## 2012-03-16 MED ORDER — ADULT MULTIVITAMIN W/MINERALS CH
1.0000 | ORAL_TABLET | Freq: Every day | ORAL | Status: DC
  Administered 2012-03-17 – 2012-03-19 (×2): 1 via ORAL
  Filled 2012-03-16 (×4): qty 1

## 2012-03-16 MED ORDER — 0.9 % SODIUM CHLORIDE (POUR BTL) OPTIME
TOPICAL | Status: DC | PRN
  Administered 2012-03-16: 1000 mL

## 2012-03-16 MED ORDER — HYDROMORPHONE HCL PF 1 MG/ML IJ SOLN: 0.2500 mg | INTRAMUSCULAR | Status: DC | PRN

## 2012-03-16 MED ORDER — DEXAMETHASONE SODIUM PHOSPHATE 4 MG/ML IJ SOLN
INTRAMUSCULAR | Status: DC | PRN
  Administered 2012-03-16: 10 mg

## 2012-03-16 MED ORDER — SODIUM CHLORIDE 0.9 % IV SOLN
INTRAVENOUS | Status: AC
  Administered 2012-03-16: 02:00:00 via INTRAVENOUS

## 2012-03-16 MED ORDER — BISACODYL 5 MG PO TBEC: 5.0000 mg | DELAYED_RELEASE_TABLET | Freq: Every day | ORAL | Status: DC | PRN

## 2012-03-16 MED ORDER — ONE-DAILY MULTI VITAMINS PO TABS
1.0000 | ORAL_TABLET | Freq: Every day | ORAL | Status: DC
Start: 2012-03-16 — End: 2012-03-16

## 2012-03-16 MED ORDER — VANCOMYCIN HCL IN DEXTROSE 1-5 GM/200ML-% IV SOLN
1000.0000 mg | Freq: Two times a day (BID) | INTRAVENOUS | Status: AC
  Administered 2012-03-16: 1000 mg via INTRAVENOUS
  Filled 2012-03-16: qty 200

## 2012-03-16 MED ORDER — LACTATED RINGERS IV SOLN
INTRAVENOUS | Status: DC
  Administered 2012-03-16: 09:00:00 via INTRAVENOUS

## 2012-03-16 MED ORDER — VANCOMYCIN HCL 1000 MG IV SOLR
1000.0000 mg | Freq: Once | INTRAVENOUS | Status: AC
  Administered 2012-03-16: 1000 mg via INTRAVENOUS
  Filled 2012-03-16: qty 1000

## 2012-03-16 MED ORDER — ACETAMINOPHEN 325 MG PO TABS: 650.0000 mg | ORAL_TABLET | Freq: Four times a day (QID) | ORAL | Status: DC | PRN

## 2012-03-16 MED ORDER — ENOXAPARIN SODIUM 40 MG/0.4ML ~~LOC~~ SOLN: 40.0000 mg | SUBCUTANEOUS | Status: DC

## 2012-03-16 MED ORDER — ATORVASTATIN CALCIUM 40 MG PO TABS
40.0000 mg | ORAL_TABLET | Freq: Every day | ORAL | Status: DC
  Administered 2012-03-16 – 2012-03-18 (×3): 40 mg via ORAL
  Filled 2012-03-16 (×6): qty 1

## 2012-03-16 MED ORDER — MENTHOL 3 MG MT LOZG: 1.0000 | LOZENGE | OROMUCOSAL | Status: DC | PRN

## 2012-03-16 MED ORDER — METOPROLOL TARTRATE 50 MG PO TABS
50.0000 mg | ORAL_TABLET | Freq: Two times a day (BID) | ORAL | Status: DC
  Administered 2012-03-16 – 2012-03-18 (×5): 50 mg via ORAL
  Filled 2012-03-16 (×6): qty 1

## 2012-03-16 MED ORDER — CILOSTAZOL 50 MG PO TABS
50.0000 mg | ORAL_TABLET | Freq: Two times a day (BID) | ORAL | Status: DC
  Administered 2012-03-16 – 2012-03-19 (×6): 50 mg via ORAL
  Filled 2012-03-16 (×12): qty 1

## 2012-03-16 MED ORDER — SENNA-DOCUSATE SODIUM 8.6-50 MG PO TABS: 1.0000 | ORAL_TABLET | Freq: Every day | ORAL | Status: DC

## 2012-03-16 MED ORDER — MIDAZOLAM HCL 2 MG/2ML IJ SOLN
1.0000 mg | INTRAMUSCULAR | Status: DC | PRN
  Administered 2012-03-16: 1 mg via INTRAVENOUS

## 2012-03-16 MED ORDER — OMEGA-3-ACID ETHYL ESTERS 1 G PO CAPS
1.0000 g | ORAL_CAPSULE | Freq: Every day | ORAL | Status: DC
  Administered 2012-03-17 – 2012-03-19 (×3): 1 g via ORAL
  Filled 2012-03-16 (×4): qty 1

## 2012-03-16 MED ORDER — METHOCARBAMOL 100 MG/ML IJ SOLN
500.0000 mg | Freq: Four times a day (QID) | INTRAVENOUS | Status: DC | PRN
  Filled 2012-03-16: qty 5

## 2012-03-16 MED ORDER — MEMANTINE HCL 10 MG PO TABS
10.0000 mg | ORAL_TABLET | Freq: Two times a day (BID) | ORAL | Status: DC
  Administered 2012-03-16 – 2012-03-19 (×6): 10 mg via ORAL
  Filled 2012-03-16 (×8): qty 1

## 2012-03-16 MED ORDER — HYDROCODONE-ACETAMINOPHEN 10-325 MG PO TABS: 1.0000 | ORAL_TABLET | Freq: Four times a day (QID) | ORAL | Status: DC | PRN

## 2012-03-16 MED ORDER — ASPIRIN 81 MG PO CHEW
81.0000 mg | CHEWABLE_TABLET | Freq: Every day | ORAL | Status: DC
  Administered 2012-03-17 – 2012-03-19 (×3): 81 mg via ORAL
  Filled 2012-03-16 (×3): qty 1

## 2012-03-16 MED ORDER — PROMETHAZINE HCL 25 MG/ML IJ SOLN: 6.2500 mg | INTRAMUSCULAR | Status: DC | PRN

## 2012-03-16 MED ORDER — ENOXAPARIN SODIUM 40 MG/0.4ML ~~LOC~~ SOLN
40.0000 mg | SUBCUTANEOUS | Status: DC
  Administered 2012-03-17 – 2012-03-19 (×3): 40 mg via SUBCUTANEOUS
  Filled 2012-03-16 (×4): qty 0.4

## 2012-03-16 MED ORDER — TIOTROPIUM BROMIDE MONOHYDRATE 18 MCG IN CAPS
18.0000 ug | ORAL_CAPSULE | Freq: Every morning | RESPIRATORY_TRACT | Status: DC
  Administered 2012-03-17 – 2012-03-19 (×3): 18 ug via RESPIRATORY_TRACT
  Filled 2012-03-16: qty 5

## 2012-03-16 MED ORDER — DOCUSATE SODIUM 100 MG PO CAPS
100.0000 mg | ORAL_CAPSULE | Freq: Two times a day (BID) | ORAL | Status: DC
  Administered 2012-03-16 – 2012-03-19 (×7): 100 mg via ORAL
  Filled 2012-03-16 (×9): qty 1

## 2012-03-16 MED ORDER — ROPIVACAINE HCL 5 MG/ML IJ SOLN
INTRAMUSCULAR | Status: DC | PRN
  Administered 2012-03-16: 150 mg via EPIDURAL

## 2012-03-16 MED ORDER — LACTATED RINGERS IV SOLN
INTRAVENOUS | Status: DC | PRN
  Administered 2012-03-16: 10:00:00 via INTRAVENOUS

## 2012-03-16 MED ORDER — ONDANSETRON HCL 4 MG/2ML IJ SOLN
INTRAMUSCULAR | Status: DC | PRN
  Administered 2012-03-16: 4 mg via INTRAVENOUS

## 2012-03-16 MED ORDER — HYDROCHLOROTHIAZIDE 10 MG/ML ORAL SUSPENSION
6.2500 mg | Freq: Every day | ORAL | Status: DC
  Administered 2012-03-16 – 2012-03-19 (×4): 6.25 mg via ORAL
  Filled 2012-03-16 (×4): qty 1.25

## 2012-03-16 MED ORDER — POTASSIUM CHLORIDE IN NACL 20-0.9 MEQ/L-% IV SOLN
INTRAVENOUS | Status: DC
  Administered 2012-03-16 (×2): via INTRAVENOUS
  Filled 2012-03-16 (×5): qty 1000

## 2012-03-16 MED ORDER — MIRTAZAPINE 30 MG PO TABS
30.0000 mg | ORAL_TABLET | Freq: Every day | ORAL | Status: DC
  Administered 2012-03-16 – 2012-03-18 (×3): 30 mg via ORAL
  Filled 2012-03-16 (×5): qty 1

## 2012-03-16 MED ORDER — MORPHINE SULFATE 2 MG/ML IJ SOLN: 0.5000 mg | INTRAMUSCULAR | Status: DC | PRN

## 2012-03-16 MED ORDER — FENTANYL CITRATE 0.05 MG/ML IJ SOLN
50.0000 ug | INTRAMUSCULAR | Status: DC | PRN
  Administered 2012-03-16: 50 ug via INTRAVENOUS

## 2012-03-16 MED ORDER — IRBESARTAN 75 MG PO TABS
75.0000 mg | ORAL_TABLET | Freq: Every day | ORAL | Status: DC
  Administered 2012-03-16 – 2012-03-19 (×4): 75 mg via ORAL
  Filled 2012-03-16 (×4): qty 1

## 2012-03-16 MED ORDER — METHOCARBAMOL 500 MG PO TABS: 500.0000 mg | ORAL_TABLET | Freq: Four times a day (QID) | ORAL | Status: DC | PRN

## 2012-03-16 SURGICAL SUPPLY — 68 items
BANDAGE ELASTIC 6 VELCRO ST LF (GAUZE/BANDAGES/DRESSINGS) ×2 IMPLANT
BANDAGE ESMARK 6X9 LF (GAUZE/BANDAGES/DRESSINGS) ×1 IMPLANT
BENZOIN TINCTURE PRP APPL 2/3 (GAUZE/BANDAGES/DRESSINGS) ×2 IMPLANT
BIT DRILL 2.5X2.75 QC CALB (BIT) ×2 IMPLANT
BIT DRILL 2.9 CANN QC NONSTRL (BIT) ×2 IMPLANT
BIT DRILL 3.5X5.5 QC CALB (BIT) ×2 IMPLANT
BNDG COHESIVE 4X5 TAN STRL (GAUZE/BANDAGES/DRESSINGS) ×2 IMPLANT
BNDG ESMARK 6X9 LF (GAUZE/BANDAGES/DRESSINGS) ×2
BOOTCOVER CLEANROOM LRG (PROTECTIVE WEAR) IMPLANT
CLOTH BEACON ORANGE TIMEOUT ST (SAFETY) ×2 IMPLANT
CLSR STERI-STRIP ANTIMIC 1/2X4 (GAUZE/BANDAGES/DRESSINGS) ×2 IMPLANT
COVER SURGICAL LIGHT HANDLE (MISCELLANEOUS) ×2 IMPLANT
CUFF TOURNIQUET SINGLE 34IN LL (TOURNIQUET CUFF) ×2 IMPLANT
CUFF TOURNIQUET SINGLE 44IN (TOURNIQUET CUFF) IMPLANT
DECANTER SPIKE VIAL GLASS SM (MISCELLANEOUS) IMPLANT
DRAPE C-ARM 42X72 X-RAY (DRAPES) IMPLANT
DRAPE C-ARMOR (DRAPES) ×2 IMPLANT
DRAPE OEC MINIVIEW 54X84 (DRAPES) IMPLANT
DRAPE U-SHAPE 47X51 STRL (DRAPES) ×2 IMPLANT
DRSG PAD ABDOMINAL 8X10 ST (GAUZE/BANDAGES/DRESSINGS) ×2 IMPLANT
DURAPREP 26ML APPLICATOR (WOUND CARE) ×2 IMPLANT
ELECT REM PT RETURN 9FT ADLT (ELECTROSURGICAL) ×2
ELECTRODE REM PT RTRN 9FT ADLT (ELECTROSURGICAL) ×1 IMPLANT
GAUZE XEROFORM 1X8 LF (GAUZE/BANDAGES/DRESSINGS) ×2 IMPLANT
GLOVE BIOGEL PI IND STRL 8 (GLOVE) ×1 IMPLANT
GLOVE BIOGEL PI INDICATOR 8 (GLOVE) ×1
GLOVE ORTHO TXT STRL SZ7.5 (GLOVE) ×2 IMPLANT
GLOVE SURG ORTHO 8.0 STRL STRW (GLOVE) ×4 IMPLANT
GOWN STRL REIN XL XLG (GOWN DISPOSABLE) ×4 IMPLANT
K-WIRE ACE 1.6X6 (WIRE) ×4
KIT BASIN OR (CUSTOM PROCEDURE TRAY) ×2 IMPLANT
KIT ROOM TURNOVER OR (KITS) ×2 IMPLANT
KWIRE ACE 1.6X6 (WIRE) ×2 IMPLANT
MANIFOLD NEPTUNE II (INSTRUMENTS) IMPLANT
NS IRRIG 1000ML POUR BTL (IV SOLUTION) ×2 IMPLANT
PACK ORTHO EXTREMITY (CUSTOM PROCEDURE TRAY) ×2 IMPLANT
PAD ARMBOARD 7.5X6 YLW CONV (MISCELLANEOUS) ×4 IMPLANT
PAD CAST 4YDX4 CTTN HI CHSV (CAST SUPPLIES) ×1 IMPLANT
PADDING CAST COTTON 4X4 STRL (CAST SUPPLIES) ×1
PLATE ACE 3.5MM 4HOLE (Plate) ×2 IMPLANT
PLATE LOCK 7H 92 BILAT FIB (Plate) ×2 IMPLANT
SCREW ACE CAN 4.0 38M (Screw) ×2 IMPLANT
SCREW ACE CAN 4.0 42M (Screw) ×2 IMPLANT
SCREW CORTICAL 3.5MM  20MM (Screw) ×1 IMPLANT
SCREW CORTICAL 3.5MM  28MM (Screw) ×2 IMPLANT
SCREW CORTICAL 3.5MM 20MM (Screw) ×1 IMPLANT
SCREW CORTICAL 3.5MM 28MM (Screw) ×2 IMPLANT
SCREW LOCK 3.5X10 DIST TIB (Screw) ×2 IMPLANT
SCREW LOCK CORT STAR 3.5X10 (Screw) ×4 IMPLANT
SCREW LOCK CORT STAR 3.5X14 (Screw) ×4 IMPLANT
SCREW NON LOCKING LP 3.5 14MM (Screw) ×4 IMPLANT
SPONGE GAUZE 4X4 12PLY (GAUZE/BANDAGES/DRESSINGS) ×2 IMPLANT
SPONGE LAP 18X18 X RAY DECT (DISPOSABLE) ×2 IMPLANT
SPONGE LAP 4X18 X RAY DECT (DISPOSABLE) IMPLANT
STAPLER VISISTAT 35W (STAPLE) IMPLANT
SUCTION FRAZIER TIP 10 FR DISP (SUCTIONS) ×2 IMPLANT
SUT ETHILON 3 0 PS 1 (SUTURE) ×2 IMPLANT
SUT MNCRL AB 4-0 PS2 18 (SUTURE) ×2 IMPLANT
SUT VIC AB 0 CT1 27 (SUTURE) ×1
SUT VIC AB 0 CT1 27XBRD ANBCTR (SUTURE) ×1 IMPLANT
SUT VIC AB 3-0 SH 8-18 (SUTURE) ×2 IMPLANT
SYR CONTROL 10ML LL (SYRINGE) IMPLANT
TOWEL OR 17X24 6PK STRL BLUE (TOWEL DISPOSABLE) ×2 IMPLANT
TOWEL OR 17X26 10 PK STRL BLUE (TOWEL DISPOSABLE) ×2 IMPLANT
TUBE CONNECTING 12X1/4 (SUCTIONS) ×2 IMPLANT
UNDERPAD 30X30 INCONTINENT (UNDERPADS AND DIAPERS) IMPLANT
WATER STERILE IRR 1000ML POUR (IV SOLUTION) ×2 IMPLANT
YANKAUER SUCT BULB TIP NO VENT (SUCTIONS) IMPLANT

## 2012-03-16 NOTE — Progress Notes (Addendum)
Subjective: Pt admitted by Ortho and plan is for OR due to ankle Fx.  She slipped in her house and fell and broke her R ankle and presented with pain.  In the ED her Fx was reduced. She has MMP and a H/O grade 1 Endometrial CA not amenable to elective operation, CAD/MI, VT 04/2011, Sig COPD on  Chronic FIO2  She denies CP, Worsening SOB. Full ROS obtained and no new issues.  Objective: Vital signs in last 24 hours: Temp:  [97.5 F (36.4 C)-97.6 F (36.4 C)] 97.5 F (36.4 C) (11/05 0324) Pulse Rate:  [78-91] 86  (11/05 0324) Resp:  [16-25] 20  (11/05 0324) BP: (119-161)/(65-82) 141/68 mmHg (11/05 0324) SpO2:  [90 %-96 %] 94 % (11/05 0324) Weight:  [71.215 kg (157 lb)] 71.215 kg (157 lb) (11/04 2322) Weight change:  Last BM Date: 03/15/12  CBG (last 3)  No results found for this basename: GLUCAP:3 in the last 72 hours  Intake/Output from previous day: No intake or output data in the 24 hours ending 03/16/12 0804     Physical Exam  General appearance: A and O.  Some short term mem issues,. Eyes: no scleral icterus Throat: oropharynx moist without erythema Resp: Distant but clear Cardio: Reg GI: soft, non-tender; bowel sounds normal; no masses,  no organomegaly Extremities: no clubbing, cyanosis or edema.  R ankle Casted. She is in mild pain.   Lab Results:  [facility] 03/15/12 2355  NA 138  K 4.0  CL 101  CO2 27  GLUCOSE 115*  BUN 19  CREATININE 0.82  CALCIUM 10.0  MG --  PHOS --    No results found for this basename: AST:2,ALT:2,ALKPHOS:2,BILITOT:2,PROT:2,ALBUMIN:2 in the last 72 hours   Basename 03/15/12 2355  WBC 12.4*  NEUTROABS 9.7*  HGB 13.1  HCT 40.6  MCV 92.5  PLT 231    No results found for this basename: INR,  PROTIME    No results found for this basename: CKTOTAL:3,CKMB:3,CKMBINDEX:3,TROPONINI:3 in the last 72 hours  No results found for this basename: TSH,T4TOTAL,FREET3,T3FREE,THYROIDAB in the last 72 hours  No results found for this  basename: VITAMINB12:2,FOLATE:2,FERRITIN:2,TIBC:2,IRON:2,RETICCTPCT:2 in the last 72 hours  Micro Results: No results found for this or any previous visit (from the past 240 hour(s)).   Studies/Results: Dg Chest 1 View  03/16/2012  *RADIOLOGY REPORT*  Clinical Data: Left ankle fracture dislocation.  Preoperative respiratory evaluation.  History of breast cancer and endometrial cancer.  PORTABLE CHEST - 1 VIEW  Comparison: CT chest 11/21/2011, 05/14/2010, 09/20/2009.  Findings: Suboptimal inspiration with atelectasis in the lung bases.  Cardiac silhouette mildly enlarged for technique and degree of inspiration, unchanged.  Thoracic aorta atherosclerotic, unchanged.  Hilar and mediastinal contours otherwise unremarkable. Lungs otherwise clear.  Surgical clips in the left breast.  IMPRESSION: Suboptimal inspiration accounts for bibasilar atelectasis.  No acute cardiopulmonary disease otherwise.   Original Report Authenticated By: Hulan Saas, M.D.    Dg Tibia/fibula Right  03/15/2012  *RADIOLOGY REPORT*  Clinical Data: Status post fall; obvious right lower leg deformity.  RIGHT TIBIA AND FIBULA - 2 VIEW  Comparison: None.  Findings: There is a comminuted fracture involving the distal tibia, and a displaced fracture involving the distal fibula, with associated dislocation at the tibiotalar joint.  The talus is posteriorly displaced, though it still articulates with the posterior aspect of the tibial plafond.  No additional fractures are seen.  The proximal tibia and fibula appear intact.  The knee joint demonstrates a small joint effusion, but  is otherwise grossly unremarkable.  Scattered vascular calcifications are seen.  IMPRESSION:  1.  Comminuted fracture involving the distal tibia, and displaced fracture of the distal fibula, with associated dislocation at the tibiotalar joint.  Talus posteriorly displaced, though it still articulates with the posterior aspect of the tibial plafond. 2.  Small knee  joint effusion noted.   Original Report Authenticated By: Tonia Ghent, M.D.    Dg Ankle 2 Views Right  03/16/2012  *RADIOLOGY REPORT*  Clinical Data: Status post reduction of right ankle fracture/dislocation.  RIGHT ANKLE - 2 VIEW  Comparison: None.  Findings: There has been interval reduction of the previously noted fracture/dislocation of the distal tibia and fibula.  Fracture fragments are noted in grossly anatomic alignment, with approximately 3 mm of residual posterior displacement at the tibial plafond.  The talus has been reduced; no new fractures are seen.  Evaluation of the soft tissues is suboptimal due to the overlying splint.  IMPRESSION: Interval reduction of previously noted fracture/dislocation of the distal tibia and fibula, noted in grossly anatomic alignment.  No new fractures seen.   Original Report Authenticated By: Tonia Ghent, M.D.    Dg Ankle Complete Right  03/15/2012  *RADIOLOGY REPORT*  Clinical Data: Status post fall; right lower leg deformity.  RIGHT ANKLE - COMPLETE 3+ VIEW  Comparison: None.  Findings: There are comminuted fractures involving the distal tibia and fibula, with posterior displacement at both fracture sites, and associated dislocation of the tibiotalar joint.  The talus articulates with the posterior aspect of the tibial plafond, but there is superior and posterior displacement of this fragment.  No additional fractures are identified.  Scattered vascular calcifications are seen.  Soft tissue swelling is noted about the fracture sites.  IMPRESSION:  1.  Comminuted fractures involving the distal tibia and fibula, with posterior displacement at both fracture sites, and associated dislocation of the tibiotalar joint. 2.  Scattered vascular calcifications seen.   Original Report Authenticated By: Tonia Ghent, M.D.    Ct Head Wo Contrast  03/16/2012  *RADIOLOGY REPORT*  Clinical Data: Status post fall; concern for head injury.  CT HEAD WITHOUT CONTRAST   Technique:  Contiguous axial images were obtained from the base of the skull through the vertex without contrast.  Comparison: CT of the head performed 05/22/2011  Findings: There is no evidence of acute infarction, mass lesion, or intra- or extra-axial hemorrhage on CT.  Multiple chronic infarcts are again noted, at the right frontal parietal, left parietal and bilateral occipital regions, unchanged in appearance from the prior study.  Diffuse periventricular and subcortical white matter change likely reflects small vessel ischemic microangiopathy.  Prominence of the sulci suggests mild cortical volume loss.  Cerebellar atrophy is noted, with multiple small chronic cerebellar infarcts.  The brainstem and fourth ventricle are within normal limits.  The basal ganglia are unremarkable in appearance.  The cerebral hemispheres demonstrate grossly normal gray-white differentiation. No mass effect or midline shift is seen.   There is no evidence of fracture; visualized osseous structures are unremarkable in appearance.  The orbits are within normal limits.  The paranasal sinuses and mastoid air cells are well- aerated.  No significant soft tissue abnormalities are seen.  IMPRESSION:  1.  No evidence of traumatic intracranial injury or fracture. 2.  Multiple chronic bilateral infarcts again noted.  Mild cortical volume loss.   Original Report Authenticated By: Tonia Ghent, M.D.    Ct Ankle Right Wo Contrast  03/16/2012  *RADIOLOGY REPORT*  Clinical Data:  Fracture.  CT OF THE RIGHT ANKLE WITH CONTRAST  Technique:  Multidetector CT imaging was performed following the standard protocol during bolus administration of intravenous contrast.  Comparison: 03/15/2012  Findings: Longitudinal fracture of the distal tibia extends from the posterior diaphyseal cortex with dominant fracture plane coronally oriented, extending to the midportion of the tibial distal articular surface with mild comminution along the medial malleolar  margin of the fracture plane.  Medially the fracture extends into the anterior portion of the medial malleolus.  There is a similarly oriented oblique longitudinal coronal fracture of the distal fibula with mild comminution along the fracture plane. Both the distal tibial and distal fibular bone fracture planes are separated by 305 mm, including the distal articular surface. Clearly both fractures extend into the distal tibiofibular joint. Several small fragments are present along the distal margins of the fracture planes.  Subtalar joints appear intact.  No talar dome fracture noted. Subtalar joints intact.  Calcaneus and cuboid appear unremarkable, and no separation at the Lisfranc joint is observed.  Navicular unremarkable.  No tendon entrapment observed.  IMPRESSION:  1.  Primarily coronally oriented fractures of the distal tibia and fibula, with 3-5 mm of separation along the dominant fracture planes and mild comminution along the dominant fracture planes which extends to the distal tibial and fibular articular surfaces.   Original Report Authenticated By: Gaylyn Rong, M.D.      Medications: Scheduled:    . sodium chloride   Intravenous STAT  . aspirin  81 mg Oral Daily  . atorvastatin  40 mg Oral q1800  . calcium-vitamin D  1 tablet Oral Daily  . cilostazol  50 mg Oral BID  . [COMPLETED] etomidate  0.2 mg/kg Intravenous Once  . irbesartan  75 mg Oral Daily   And  . hydrochlorothiazide  6.25 mg Oral Daily  . memantine  10 mg Oral BID  . metoprolol  50 mg Oral BID  . mirtazapine  30 mg Oral QHS  . mometasone-formoterol  2 puff Inhalation BID  . [COMPLETED]  morphine injection  4 mg Intravenous Once  . multivitamin with minerals  1 tablet Oral Daily  . [COMPLETED] sodium chloride  500 mL Intravenous Once  . tiotropium  18 mcg Inhalation q morning - 10a  . [DISCONTINUED] multivitamin  1 tablet Oral Daily  . [DISCONTINUED] valsartan-hydrochlorothiazide  0.5 tablet Oral Daily    Continuous:    . 0.9 % NaCl with KCl 20 mEq / L       Assessment/Plan: Principal Problem:  *Closed dislocation of right ankle, with complex fracture   R Ankle Fracture admitted by Ortho and to have Surgery later today.  She is high risk of any procedure due to CAD and COPD.Marland Kitchen  However there is no choice here.  She needs the surgery in order to have any future QOL.  I will make Dr [name] aware.  I had long discussion @ Risks of surgery including Death - Due to pain and need for function this risk is worth taking.  Pt and daughter Voiced this opinion as well.  Goal right now is pain control until surgery. COPD c Chronic Hypoxia- on Chronic FIO2 and Rxs.  Dr [name] is her Pulm Doctor.  She has been in [employer] lately.  She remains off Amiodarone.  Advair/Spiriva/Ventolin. Continue 2-4 L [place].  CXR/EKG/Labs Reviewed with Dr Osie Bond.  I called Pulm CCM and they will follow along and be ready for any pulm issue. HTN - BP fine.  Hyperlipidemia Syncope/NSVT - Dr Ladona Ridgel.  Off Amio CAD - H/o MI.  Stress test 05/2011 Low risk stress nuclear study. Horizontal images suggest small area of reversability in lateral wall not confirmed In short axis views. SDS only 3 Baseline ECG with ST/T wave changes...ie (-).  She has preserved LV Fx. Dr Sw[place] is her Cardiologist. Mild Carotid Stenosis - on approp meds Endometrial CA - Not amenable to elective Surgery. IUD In Place Breast CA S/P Rx in Remission. Dementia - On meds.  Constipation - Medical Rx c miralax/Colace. CVA on CT scans DVT Prophylaxis - Lovenox post op  Will work on DNR Post op. If she does well she will need Rehab and AL.    LOS: 1 day   Demerius Podolak M 03/16/2012, 8:04 AM  From 01/20/2012:  Impression & Recommendations:  Problem # 1:  HYPOXEMIA (ICD-799.02)  Wear FIO2 24/7 per Dr Reginia Naas request. She plans on more pulm rehab I hand wrote a Rehab Rx   Problem # 2:  ADULT FAILURE TO THRIVE (ICD-783.7) QOL and function are primary  goal. She now has aides and is more compliant with medicines,. I support cardio/pulm rehab Avoidance of falls is goal. She is using the hand brake rolling walker.  Remains Dysthymic at baseline  Regular routine is recommended.  Problem # 3:  ADENOCARCINOMA, UTERUS (ICD-179) S/P IUD and following up with Gyn Onc in @ 2 weeks.  Problem # 4:  MEMORY LOSS (ICD-780.93)  On Namenda Max Dose. No progressive memory issues. Family reports stable and no more dangerous issues. They also report compliance with medicines. We may need to start Aricept. Her Sxs are compatible with Dementia, Alz type.    Problem # 5:  COPD (ICD-496) Ex smoker with chronic dyspnea - Getting worse w/ activity, occasional wheezing. Breathing hard/ pursed lip breathing on exertion over short distances.  She smoked upto 2 PP for > 60 Pys before quitting in 2009 when she another MI.  Last Spirometry showed FEV1 of 65% c/w mild obstruction (surprisingly better than expected).  She is maintained on a regimen of spiriva & ventolin HFA ,  pulm rehab, spacer  CT chest/abd 7/13 showed RLL Scar, stable LNs, o/w (-), No metastatic Dz, 2.5 cm R adnexal cyst seen prior.   Per Dr Carlena Sax note from 01/16/12 "7/13 stable airway obstruciton when compared to 2011 However hypoxia & bibasal crackles indicate lung function worse  - no overt heart failure or infiltrates on CT -Amiodarone stopped but it will be a while before his is out of her system She does not want nebuliser therapy -   ct advair to 250/50 -went over MDi technique Stay on spiriva Ct  portable O2"  She now wants to re-enroll in Maintenance CP Rehab and I support this. Rx written  Wearing O2 24/7  Her updated medication list for this problem includes:    Spiriva Handihaler 18 Mcg Caps (Tiotropium bromide monohydrate) .Marland Kitchen... 1 capsule using hand held device qd    Ventolin Hfa 108 (90 Base) Mcg/act Aers (Albuterol sulfate) .Marland Kitchen... 2 puffs bid prn for  sob  Orders: Pulse Ox ([id number])   Problem # 6:  CAD (ICD-414.00) CAD with H/O Myocardial infarction 2009 -PCI x 2 stents.  Last PCI - December 2010. Goal is stability annd RF reduction.  Dr [place].   No Angina although she cannot exert self well due to pulmonary related SOB. Seen by Dr Hollie Salk earlier in yr post Syncopal Event. Her tests were satisfactory except for the  event monitor where she had several runs of non sustained VT which was suspected the etiology for her syncope. She then saw Dr Ladona Ridgel and he has tried meds. 05/28/11 Carotids: Mild carotid disease. No significant stenosis.  05/29/11 Low risk stress nuclear study. 05/22/11 ECHO = Nml.    Her updated medication list for this problem includes:    Pacerone 100 Mg Tabs (Amiodarone hcl) ..... On hold?    Aspirin 81 Mg Tbec (Aspirin) ..... One po every day    Cilostazol 50 Mg Tabs (Cilostazol) .Marland Kitchen... 1 tab po bid    Diovan Hct 160-12.5 Mg Tabs (Valsartan-hydrochlorothiazide) .Marland Kitchen... Take 1 daily as directed    Metoprolol Tartrate 50 Mg Tabs (Metoprolol tartrate) .Marland Kitchen... 1 po bid    Plavix 75 Mg Tabs (Clopidogrel bisulfate) .Marland Kitchen... 1 tab po qd    Nitrostat 0.4 Mg Subl (Nitroglycerin) .Marland Kitchen... Prn for chest pain    Problem # 7:  HYPERTENSION, BENIGN (ICD-401.1)  Her updated medication list for this problem includes:    Diovan Hct 160-12.5 Mg Tabs (Valsartan-hydrochlorothiazide) .Marland Kitchen... Take 1 daily as directed    Metoprolol Tartrate 50 Mg Tabs (Metoprolol tartrate) .Marland Kitchen... 1 po bid  BP today: 126/74 Prior BP: 118/60 (12/09/2011)  Labs Reviewed: Creat: 1.0 (11/18/2011) Chol: 190 (03/04/2011)   HDL: 48 (10/03/2008)   LDL: 94 (03/04/2011)      Medications Added to Medication List This Visit: 1)  Pacerone 100 Mg Tabs (Amiodarone hcl) .... On hold?  Complete Medication List: 1)  Pacerone 100 Mg Tabs (Amiodarone hcl) .... On hold? 2)  Aspirin 81 Mg Tbec (Aspirin) .... One po every day 3)  Alprazolam 0.25 Mg Tabs (Alprazolam)  .Marland Kitchen.. 1 tab po bid prn 4)  Calcium Lactate 750 Mg Tabs (Calcium lactate) .Marland Kitchen.. 1 tab po qd 5)  Cilostazol 50 Mg Tabs (Cilostazol) .Marland Kitchen.. 1 tab po bid 6)  Diovan Hct 160-12.5 Mg Tabs (Valsartan-hydrochlorothiazide) .... Take 1 daily as directed 7)  Fosamax 70 Mg Tabs (Alendronate sodium) .Marland Kitchen.. 1 tab po q week 8)  Lipitor 40 Mg Tabs (Atorvastatin calcium) .Marland Kitchen.. 1 po qd 9)  Metoprolol Tartrate 50 Mg Tabs (Metoprolol tartrate) .Marland Kitchen.. 1 po bid 10)  Plavix 75 Mg Tabs (Clopidogrel bisulfate) .Marland Kitchen.. 1 tab po qd 11)  Remeron 30 Mg Tabs (Mirtazapine) .Marland Kitchen.. 1 tab po qhs 12)  Spiriva Handihaler 18 Mcg Caps (Tiotropium bromide monohydrate) .Marland Kitchen.. 1 capsule using hand held device qd 13)  Multivitamins Tabs (Multiple vitamin) .... Take one tablet by mouth every day 14)  Ventolin Hfa 108 (90 Base) Mcg/act Aers (Albuterol sulfate) .... 2 puffs bid prn for sob 15)  Nitrostat 0.4 Mg Subl (Nitroglycerin) .... Prn for chest pain 16)  Fish Oil 1000 Mg Caps (Omega-3 fatty acids) .... One tablet by mouth once a day 17)  Namenda 10 Mg Tabs (Memantine hcl) .... One bid  Other Orders: FLU VACC 2013 (CPT-Q2037) PNA Vax @ 2008

## 2012-03-16 NOTE — Anesthesia Preprocedure Evaluation (Signed)
Anesthesia Evaluation  Patient identified by MRN, date of birth, ID band Patient awake    Reviewed: Allergy & Precautions, H&P , NPO status , Patient's Chart, lab work & pertinent test results  History of Anesthesia Complications Negative for: history of anesthetic complications  Airway Mallampati: III TM Distance: >3 FB Neck ROM: Full    Dental  (+) Teeth Intact and Dental Advisory Given   Pulmonary shortness of breath and with exertion, COPD COPD inhaler,    Pulmonary exam normal       Cardiovascular hypertension, Pt. on home beta blockers + CAD and + Past MI + dysrhythmias Supra Ventricular Tachycardia     Neuro/Psych PSYCHIATRIC DISORDERS Anxiety Depression    GI/Hepatic Neg liver ROS,   Endo/Other  negative endocrine ROS  Renal/GU negative Renal ROS     Musculoskeletal   Abdominal   Peds  Hematology   Anesthesia Other Findings   Reproductive/Obstetrics                           Anesthesia Physical Anesthesia Plan  ASA: III  Anesthesia Plan: General   Post-op Pain Management:    Induction: Intravenous  Airway Management Planned: LMA  Additional Equipment:   Intra-op Plan:   Post-operative Plan: Extubation in OR  Informed Consent: I have reviewed the patients History and Physical, chart, labs and discussed the procedure including the risks, benefits and alternatives for the proposed anesthesia with the patient or authorized representative who has indicated his/her understanding and acceptance.   Dental advisory given  Plan Discussed with: CRNA, Anesthesiologist and Surgeon  Anesthesia Plan Comments:         Anesthesia Quick Evaluation

## 2012-03-16 NOTE — Progress Notes (Signed)
Pt is oriented to person, o2 sat 90% on 4 L [place].  She just looks at me when I ask her questions.  Dr Krista Blue here and aware.  No new orders.  Will cont to monitor.

## 2012-03-16 NOTE — ED Notes (Signed)
Report called to [facility]; [facility] called for transfer.

## 2012-03-16 NOTE — Progress Notes (Signed)
INITIAL ADULT NUTRITION ASSESSMENT Date: 03/16/2012   Time: 3:05 PM Reason for Assessment: consult; assessment  INTERVENTION: 1.  Modify diet; per MD discretion to Regular.   DOCUMENTATION CODES Per approved criteria  Not applicable    ASSESSMENT: Female 78 y.o.  Dx: Closed dislocation of right ankle  Hx:  Past Medical History  Diagnosis Date  . Hyperlipidemia   . Hypertension   . Depression   . Osteoarthritis   . Allergic rhinitis   . Raynaud disease   . Adenomatous polyp 03/2004  . Diverticulosis   . Syncope Jan 2013    NSVT noted on event monitor  . COPD (chronic obstructive pulmonary disease) with emphysema   . Pulmonary nodule, right x2  middle and lower lobes--  stable per follow-ip ct  jan 2012  . S/P primary angioplasty with coronary stent   . History of acute inferior wall myocardial infarction SEPT 2008      S/P STENTING PROXIMAL TO MID RCA  . Endometrial hyperplasia   . History of breast cancer 2011--  ONCOLOGIST- DR Camelia Eng    S/P LEFT PARTIAL MASTECTOMY AND RADIATION---  NO RECURRENCE  . Dyspnea   . Anxiety   . CAD (coronary artery disease) CARDIOLOGIST-- DR Lewayne Bunting--- LAST VISIT 08-16-2011  NOTE IN EPIC    Remote inferior MI in 2008. Extensive stenting of the RCA, s/p repeat stenting of the RCA in Dec 2010 due to restenosis  . Dry eyes, bilateral   . Heart attack   . Cancer     breast  . Carotid stenosis, asymptomatic BILATERAL ICA --  0-39%  PER  DUPLEX 05-22-2011  . Clotting disorder   . Closed dislocation of right ankle, with complex fracture 03/16/2012   Past Surgical History  Procedure Date  . Tympanostomy tube placement   . Colonoscopy JULY 2013  . US echocardiography 05-22-2011    LVSF NORMAL/ EF 60-65%  . Left partial mastectomy w/ bx axillary lymph node  09-04-2009    INVASIVE MAMMARY CARCINOMA/  STAGE T1b NO  . Coronary angioplasty with stent placement 04-19-2009  DR PETER [place]    SINGLE VESSEL OBSTRUCTIVE DISEASE/  STENTING  OF IN-STENT RESTENOSIS MID RCA/ NORMAL LVF  . Coronary angioplasty with stent placement SEPT 2008--  [facility]    STENTING PROXIMAL TO MID RCA  . Cardiovascular stress test 05-28-2011    LOW RISK STUDY  . Bilateral breast lumpectomy BEFORE 2008  . Dilation and curettage of uterus 10/10/11  . Cataract extraction w/ intraocular lens  implant, bilateral   . Intrauterine device insertion     Related Meds:  Scheduled Meds:   . [COMPLETED] sodium chloride   Intravenous STAT  . aspirin  81 mg Oral Daily  . atorvastatin  40 mg Oral q1800  . calcium-vitamin D  1 tablet Oral Daily  . cilostazol  50 mg Oral BID  . [COMPLETED] etomidate  0.2 mg/kg Intravenous Once  . irbesartan  75 mg Oral Daily   And  . hydrochlorothiazide  6.25 mg Oral Daily  . memantine  10 mg Oral BID  . metoprolol  50 mg Oral BID  . mirtazapine  30 mg Oral QHS  . mometasone-formoterol  2 puff Inhalation BID  . [COMPLETED]  morphine injection  4 mg Intravenous Once  . multivitamin with minerals  1 tablet Oral Daily  . [COMPLETED] sodium chloride  500 mL Intravenous Once  . tiotropium  18 mcg Inhalation q morning - 10a  . [COMPLETED]  vancomycin (VANCOCIN) 1000 mg IVPB  1,000 mg Intravenous Once  . [DISCONTINUED] aspirin  81 mg Oral Daily  . [DISCONTINUED] multivitamin  1 tablet Oral Daily  . [DISCONTINUED] valsartan-hydrochlorothiazide  0.5 tablet Oral Daily   Continuous Infusions:   . 0.9 % NaCl with KCl 20 mEq / L 75 mL/hr at 03/16/12 0842  . lactated ringers 50 mL/hr at 03/16/12 0918   PRN Meds:.albuterol, albuterol, fentaNYL, HYDROcodone-acetaminophen, HYDROmorphone (DILAUDID) injection, ipratropium, methocarbamol (ROBAXIN) IV, methocarbamol, midazolam, morphine injection, nitroGLYCERIN, promethazine, [DISCONTINUED] 0.9 % irrigation (POUR BTL), [DISCONTINUED]  morphine injection   Ht:  150 cm per chart review  Wt: 157 lb (71.215 kg)  Ideal Wt:    100 lbs % Ideal Wt: 157%  Usual Wt: 153-157  lbs % Usual Wt: 100%  Food/Nutrition Related Hx: unable to asses  Labs:  CMP     Component Value Date/Time   NA 138 03/15/2012 2355   K 4.0 03/15/2012 2355   CL 101 03/15/2012 2355   CO2 27 03/15/2012 2355   GLUCOSE 115* 03/15/2012 2355   BUN 19 03/15/2012 2355   CREATININE 0.82 03/15/2012 2355   CALCIUM 10.0 03/15/2012 2355   PROT 6.0 10/18/2011 2300   ALBUMIN 3.2* 10/18/2011 2300   AST 26 10/18/2011 2300   ALT 23 10/18/2011 2300   ALKPHOS 109 10/18/2011 2300   BILITOT 0.3 10/18/2011 2300   GFRNONAA 68* 03/15/2012 2355   GFRAA 79* 03/15/2012 2355   Intake: NPO Output:   Intake/Output Summary (Last 24 hours) at 03/16/12 1516 Last data filed at 03/16/12 1424  Gross per 24 hour  Intake    775 ml  Output    900 ml  Net   -125 ml   No stool since admission  Diet Order: NPO  Supplements/Tube Feeding: None at this time, MVI at home  IVF:    0.9 % NaCl with KCl 20 mEq / L Last Rate: 75 mL/hr at 03/16/12 0842  lactated ringers Last Rate: 50 mL/hr at 03/16/12 0918    Estimated Nutritional Needs:   Kcal: 4098-1191 Protein: 92-105g Fluid: >2.1 L/day  Pt not on floor during RD visits x2.  Pt admitted s/p fall with ankle fracture requiring surgical stabilization.  RD consult received for assessment of status and needs. Pt is being treated conservatively for endometrial cancer. Other notable dx include COPD, mild dementia, and coronary disease. Pt currently NPO due to surgery.  RD to follow for ongoing assessment and will provide recommendations as warranted.  NUTRITION DIAGNOSIS: -Increased nutrient needs (NI-2.1).  Status: Ongoing  RELATED TO: healing  AS EVIDENCE BY: s/p surgery for ankle fx  MONITORING/EVALUATION(Goals): 1.  Food/Beverage; resume of PO diet with tolerance 2.  Wt/wt change; monitor trends  EDUCATION NEEDS: -Education not appropriate at this time    Hoyt Koch 03/16/2012, 3:05 PM

## 2012-03-16 NOTE — Op Note (Signed)
03/15/2012 - 03/16/2012  PATIENT:  Melinda Mora    PRE-OPERATIVE DIAGNOSIS:  right ankle pilon fracture dislocation fracture  POST-OPERATIVE DIAGNOSIS:  Same  PROCEDURE:  OPEN REDUCTION INTERNAL FIXATION (ORIF) right pilon fracture including fixation of the fibula, posterior malleolus, and medial tibial spike.  SURGEON:  Eulas Post, MD  PHYSICIAN ASSISTANT: Janace Litten, OPA-C, present and scrubbed throughout the case, critical for completion in a timely fashion, and for retraction, instrumentation, and closure.  ANESTHESIA:   General  PREOPERATIVE INDICATIONS:  Melinda Mora is a  78 y.o. female with a diagnosis of right ankle fracture who elected for surgical management to minimize the risk for malunion and nonunion and post-traumatic arthritis.  She had a dislocation that was close reduced last night. Preoperative CT scan demonstrated involvement of the articular surface of the tibia with a large posterior segment, as well as comminution of the fibula, and extension proximally into the tibial shaft.  The risks benefits and alternatives were discussed with the patient preoperatively including but not limited to the risks of infection, bleeding, nerve injury, cardiopulmonary complications, the need for revision surgery, the need for hardware removal, among others, and the patient was willing to proceed.  OPERATIVE IMPLANTS: Biomet composite locking plate for the distal fibula, with a total of 3 screws distally, 3 screws proximally, and one interfragmentary lag screw. I used 24.0 mm cannulated screws for the articular surface. I used a one third tubular spring plate and anti-glide fragment fashion along the posterior medial proximal tibia. Overall bone quality was poor.  OPERATIVE PROCEDURE: The patient was brought to the operating room and placed in the supine position. All bony prominences were padded. General anesthesia was administered. The left lower extremity was prepped and draped  in the usual sterile fashion. The leg was elevated and exsanguinated and the tourniquet was inflated. Time out was performed.   Incision was made over the distal fibula and the fracture was exposed and reduced anatomically with a clamp. A lag screw was placed. I then applied a composite locking plate and secured it proximally and distally with locking and non-locking screws. Bone quality was poor. I used c-arm to confirm satisfactory reduction and fixation. I restored the length, and reduced the fracture anatomically.  After reduction of the fibula the posterior malleolus was nearly anatomically reduced. I gently manipulated the ankle, and once I was satisfied that I had anatomic reduction of the articular surface by C-arm visualization I placed a total of 2 cannulated 40 screws. Small skin incision was made, and blunt dissection carried down to protect the neurovascular structures anteriorly, and then a cement was used to maintain a clear path to the bone, and 240 cannulated screws were placed. These were placed bicortically. The overall bone quality was fairly poor. I did get bicortical fixation however and was satisfied with my purchase. AP and lateral views were taken.  Because the bone quality was poor, I felt that an anti-glide plate would provide the most resistance to subsequent postoperative the leg settling and migration of the posterior fragment proximally. Therefore I made an incision over the proximal medial tibial spike, dissection was carried down and the saphenous nerve was retracted anteriorly along with the vein, and I exposed the fracture site, and then applied a one third tubular plate, and anti-glide type fashion. Bicortical proximal cortical screws were placed. Excellent fixation achieved and buttressing of the fracture.  Final C-arm pictures were taken, the syndesmosis was stressed and found to be stable, and  the wounds all irrigated copiously and repaired with Vicryl followed by  routine closure for the skin. A posterior splint was applied. The tourniquet was released and she was awakened and returned to the PACU in stable and satisfactory condition. No complications.

## 2012-03-16 NOTE — Telephone Encounter (Signed)
Message left by pt's daughter stating appointment scheduled for today would need to be rescheduled due to pt fell.  This RN reviewed chart and noted pt has proceeded to the ER for work up .  E-pof entered to reschedule lab.

## 2012-03-16 NOTE — Transfer of Care (Signed)
Immediate Anesthesia Transfer of Care Note  Patient: Melinda Mora  Procedure(s) Performed: Procedure(s) (LRB) with comments: OPEN REDUCTION INTERNAL FIXATION (ORIF) ANKLE FRACTURE (Right)  Patient Location: PACU  Anesthesia Type:GA combined with regional for post-op pain  Level of Consciousness: awake, alert , oriented and patient cooperative  Airway & Oxygen Therapy: Patient Spontanous Breathing and Patient connected to nasal cannula oxygen  Post-op Assessment: Report given to PACU RN, Post -op Vital signs reviewed and stable and Patient moving all extremities  Post vital signs: Reviewed and stable  Complications: No apparent anesthesia complications

## 2012-03-16 NOTE — ED Provider Notes (Addendum)
History     CSN: [id number]  Arrival date & time 03/15/12  2146   First MD Initiated Contact with Patient 03/15/12 2305      Chief Complaint  Patient presents with  . Fall  . Ankle Pain     The history is provided by the patient.   the patient reports slipping in her house this evening and injuring her right ankle.  She has 7/10 pain in her right ankle.  Obvious dislocation noted at the bedside.  The patient denies hip pain.  She denies abdominal pain.  She has no chest pain or shortness of breath.  She does report use of Plavix.  She did strike the back of her head.  No loss consciousness.  No headache at this time.  No neck pain.  No weakness of her upper or lower extremities.  She denies back pain.  She has no other complaints.  She has a history of COPD and coronary artery disease.  She is on Plavix.  Her coronary artery disease has been stented.  Past Medical History  Diagnosis Date  . Hyperlipidemia   . Hypertension   . Depression   . Osteoarthritis   . Allergic rhinitis   . Raynaud disease   . Adenomatous polyp 03/2004  . Diverticulosis   . Syncope Jan 2013    NSVT noted on event monitor  . COPD (chronic obstructive pulmonary disease) with emphysema   . Pulmonary nodule, right x2  middle and lower lobes--  stable per follow-ip ct  jan 2012  . S/P primary angioplasty with coronary stent   . History of acute inferior wall myocardial infarction SEPT 2008      S/P STENTING PROXIMAL TO MID RCA  . Endometrial hyperplasia   . History of breast cancer 2011--  ONCOLOGIST- DR Camelia Eng    S/P LEFT PARTIAL MASTECTOMY AND RADIATION---  NO RECURRENCE  . Dyspnea   . Anxiety   . CAD (coronary artery disease) CARDIOLOGIST-- DR Lewayne Bunting--- LAST VISIT 08-16-2011  NOTE IN EPIC    Remote inferior MI in 2008. Extensive stenting of the RCA, s/p repeat stenting of the RCA in Dec 2010 due to restenosis  . Dry eyes, bilateral   . Heart attack   . Cancer     breast  . Carotid stenosis,  asymptomatic BILATERAL ICA --  0-39%  PER  DUPLEX 05-22-2011  . Clotting disorder     Past Surgical History  Procedure Date  . Tympanostomy tube placement   . Colonoscopy JULY 2013  . US echocardiography 05-22-2011    LVSF NORMAL/ EF 60-65%  . Left partial mastectomy w/ bx axillary lymph node  09-04-2009    INVASIVE MAMMARY CARCINOMA/  STAGE T1b NO  . Coronary angioplasty with stent placement 04-19-2009  DR PETER [place]    SINGLE VESSEL OBSTRUCTIVE DISEASE/  STENTING OF IN-STENT RESTENOSIS MID RCA/ NORMAL LVF  . Coronary angioplasty with stent placement SEPT 2008--  [facility]    STENTING PROXIMAL TO MID RCA  . Cardiovascular stress test 05-28-2011    LOW RISK STUDY  . Bilateral breast lumpectomy BEFORE 2008  . Dilation and curettage of uterus 10/10/11  . Cataract extraction w/ intraocular lens  implant, bilateral   . Intrauterine device insertion     Family History  Problem Relation Age of Onset  . Coronary artery disease Father     & Mother  . Heart attack Father   . Gout Father   . Osteoarthritis Mother   .  Heart disease Mother     congestive heart failure  . Hypertension Sister   . Heart disease Sister   . Cancer Sister     breast  . Breast cancer      Aunts  . Uterine cancer Sister   . Colon cancer Neg Hx     History  Substance Use Topics  . Smoking status: Former Smoker -- 2.0 packs/day for 50 years    Types: Cigarettes    Quit date: 01/11/2007  . Smokeless tobacco: Never Used  . Alcohol Use: 3.6 oz/week    6 Glasses of wine per week    OB History    Grav Para Term Preterm Abortions TAB SAB Ect Mult Living   1 1 1       1       Review of Systems  All other systems reviewed and are negative.    Allergies  Codeine; Penicillins; and Shellfish allergy  Home Medications   Current Outpatient Rx  Name  Route  Sig  Dispense  Refill  . ALBUTEROL SULFATE HFA 108 (90 BASE) MCG/ACT IN AERS   Inhalation   Inhale 2 puffs into the lungs every  6 (six) hours as needed.          . ALENDRONATE SODIUM 70 MG PO TABS   Oral   Take 70 mg by mouth every 7 (seven) days. Take with a full glass of water on an empty stomach.         . ASPIRIN 81 MG PO TABS   Oral   Take 81 mg by mouth daily.         . ATORVASTATIN CALCIUM 40 MG PO TABS   Oral   Take 1 tablet (40 mg total) by mouth daily.   90 tablet   3   . CALCIUM CARBONATE-VITAMIN D 500-200 MG-UNIT PO TABS   Oral   Take 1 tablet by mouth daily.         Marland Kitchen CILOSTAZOL 50 MG PO TABS   Oral   Take 50 mg by mouth 2 (two) times daily.          Marland Kitchen CLOPIDOGREL BISULFATE 75 MG PO TABS   Oral   Take 1 tablet (75 mg total) by mouth daily.   90 tablet   3   . FLUTICASONE-SALMETEROL 250-50 MCG/DOSE IN AEPB   Inhalation   Inhale 1 puff into the lungs every 12 (twelve) hours.   180 each   1   . MEMANTINE HCL 5 MG PO TABS   Oral   Take 10 mg by mouth 2 (two) times daily.          Marland Kitchen METOPROLOL TARTRATE 50 MG PO TABS   Oral   Take 1 tablet (50 mg total) by mouth 2 (two) times daily.   180 tablet   3   . MIRTAZAPINE 30 MG PO TABS   Oral   Take 30 mg by mouth at bedtime.          Marland Kitchen ONE-DAILY MULTI VITAMINS PO TABS   Oral   Take 1 tablet by mouth daily.          Marland Kitchen NITROGLYCERIN 0.4 MG SL SUBL   Sublingual   Place 0.4 mg under the tongue every 5 (five) minutes as needed.          Marland Kitchen FISH OIL 1200 MG PO CAPS   Oral   Take 1 capsule by mouth daily.         Marland Kitchen  TIOTROPIUM BROMIDE MONOHYDRATE 18 MCG IN CAPS   Inhalation   Place 18 mcg into inhaler and inhale every morning.          Marland Kitchen VALSARTAN-HYDROCHLOROTHIAZIDE 160-12.5 MG PO TABS   Oral   Take 0.5 tablets by mouth daily.            BP 161/76  Pulse 89  Temp 97.6 F (36.4 C) (Oral)  Resp 22  Wt 157 lb (71.215 kg)  SpO2 96%  Physical Exam  Nursing note and vitals reviewed. Constitutional: She is oriented to person, place, and time. She appears well-developed and well-nourished. No distress.   HENT:  Head: Normocephalic and atraumatic.  Eyes: EOM are normal.  Neck: Normal range of motion. Neck supple.       C-spine nontender.  C-spine cleared by Nexus criteria.  Cardiovascular: Normal rate, regular rhythm and normal heart sounds.   Pulmonary/Chest: Effort normal and breath sounds normal.  Abdominal: Soft. She exhibits no distension. There is no tenderness.  Musculoskeletal: Normal range of motion.       Deformity of right ankle with obvious dislocation.  Normal pulses in right foot.  Compartments are soft.  And wiggle her toes on her right foot.  Mild bruising noted.  No significant swelling.  No open fracture  Neurological: She is alert and oriented to person, place, and time.  Skin: Skin is warm and dry.  Psychiatric: She has a normal mood and affect. Judgment normal.    ED Course  Reduction of fracture Performed by: Lyanne Co Authorized by: Lyanne Co Consent: Verbal consent obtained. Risks and benefits: risks, benefits and alternatives were discussed Consent given by: patient Required items: required blood products, implants, devices, and special equipment available Patient identity confirmed: verbally with patient Time out: Immediately prior to procedure a "time out" was called to verify the correct patient, procedure, equipment, support staff and site/side marked as required. Patient sedated: yes (see note) Vitals: Vital signs were monitored during sedation. Patient tolerance: Patient tolerated the procedure well with no immediate complications. Comments: Reduction of right ankle fracture dislocation with manual reduction.  Placed in a posterior splint with stirrup.  Moves all toes.  Neurovascularly intact.  Compartment soft.   (including critical care time)  Procedural sedation Performed by: Lyanne Co Consent: Verbal consent obtained. Risks and benefits: risks, benefits and alternatives were discussed Required items: required blood products,  implants, devices, and special equipment available Patient identity confirmed: arm band and provided demographic data Time out: Immediately prior to procedure a "time out" was called to verify the correct patient, procedure, equipment, support staff and site/side marked as required. Sedation type: moderate (conscious) sedation NPO time confirmed and considedered Sedatives: ETOMIDATE Physician Time at Bedside: 20 Vitals: Vital signs were monitored during sedation. Cardiac Monitor, pulse oximeter Patient tolerance: Patient tolerated the procedure well with no immediate complications. Comments: Pt with uneventful recovered. Returned to pre-procedural sedation baseline    Labs Reviewed  CBC WITH DIFFERENTIAL  BASIC METABOLIC PANEL   Dg Tibia/fibula Right  03/15/2012  *RADIOLOGY REPORT*  Clinical Data: Status post fall; obvious right lower leg deformity.  RIGHT TIBIA AND FIBULA - 2 VIEW  Comparison: None.  Findings: There is a comminuted fracture involving the distal tibia, and a displaced fracture involving the distal fibula, with associated dislocation at the tibiotalar joint.  The talus is posteriorly displaced, though it still articulates with the posterior aspect of the tibial plafond.  No additional fractures are seen.  The proximal  tibia and fibula appear intact.  The knee joint demonstrates a small joint effusion, but is otherwise grossly unremarkable.  Scattered vascular calcifications are seen.  IMPRESSION:  1.  Comminuted fracture involving the distal tibia, and displaced fracture of the distal fibula, with associated dislocation at the tibiotalar joint.  Talus posteriorly displaced, though it still articulates with the posterior aspect of the tibial plafond. 2.  Small knee joint effusion noted.   Original Report Authenticated By: Tonia Ghent, M.D.    Dg Ankle 2 Views Right  03/16/2012  *RADIOLOGY REPORT*  Clinical Data: Status post reduction of right ankle fracture/dislocation.  RIGHT  ANKLE - 2 VIEW  Comparison: None.  Findings: There has been interval reduction of the previously noted fracture/dislocation of the distal tibia and fibula.  Fracture fragments are noted in grossly anatomic alignment, with approximately 3 mm of residual posterior displacement at the tibial plafond.  The talus has been reduced; no new fractures are seen.  Evaluation of the soft tissues is suboptimal due to the overlying splint.  IMPRESSION: Interval reduction of previously noted fracture/dislocation of the distal tibia and fibula, noted in grossly anatomic alignment.  No new fractures seen.   Original Report Authenticated By: Tonia Ghent, M.D.    Dg Ankle Complete Right  03/15/2012  *RADIOLOGY REPORT*  Clinical Data: Status post fall; right lower leg deformity.  RIGHT ANKLE - COMPLETE 3+ VIEW  Comparison: None.  Findings: There are comminuted fractures involving the distal tibia and fibula, with posterior displacement at both fracture sites, and associated dislocation of the tibiotalar joint.  The talus articulates with the posterior aspect of the tibial plafond, but there is superior and posterior displacement of this fragment.  No additional fractures are identified.  Scattered vascular calcifications are seen.  Soft tissue swelling is noted about the fracture sites.  IMPRESSION:  1.  Comminuted fractures involving the distal tibia and fibula, with posterior displacement at both fracture sites, and associated dislocation of the tibiotalar joint. 2.  Scattered vascular calcifications seen.   Original Report Authenticated By: Tonia Ghent, M.D.     I personally reviewed the imaging tests through PACS system.  I reviewed available ER/hospitalization records thought the EMR.    1. Ankle fracture, right       MDM  Fracture dislocation right ankle.  Reduced at the bedside with procedural sedation.  The patient will be admitted to [facility].  I discussed her case with the orthopedist.  He'll  discuss her case with her internist for preoperative evaluation.  Operation to perform likely later today.  N.p.o.  Ortho: Dr Dion Saucier Internist: Dr Laural Benes, MD 03/16/12 0054   Date: 03/16/2012  Rate: 84  Rhythm: normal sinus rhythm  QRS Axis: normal  Intervals: normal  ST/T Wave abnormalities: normal  Conduction Disutrbances: none  Narrative Interpretation:   Old EKG Reviewed: No significant changes noted       Lyanne Co, MD 03/16/12 0126

## 2012-03-16 NOTE — H&P (Signed)
ORTHOPAEDIC HISTORY AND PHYSICAL  Chief Complaint: Right ankle pain  HPI: Melinda Mora is a 78 y.o. female who complains of  right ankle pain after a twisting fall injury while in the kitchen today. She is unable to walk. She was brought into the hospital, and found to have a dislocated ankle. There was also a complex fracture noted. She did not have any bleeding. She underwent closed reduction by the emergency room physician with splinting. Pain is currently rated as mild to moderate, but she is unable to walk. She had no loss of consciousness, but did hit her head in the fall, and had a CT scan that was negative. She had no other injuries in the fall.  Past Medical History  Diagnosis Date  . Hyperlipidemia   . Hypertension   . Depression   . Osteoarthritis   . Allergic rhinitis   . Raynaud disease   . Adenomatous polyp 03/2004  . Diverticulosis   . Syncope Jan 2013    NSVT noted on event monitor  . COPD (chronic obstructive pulmonary disease) with emphysema   . Pulmonary nodule, right x2  middle and lower lobes--  stable per follow-ip ct  jan 2012  . S/P primary angioplasty with coronary stent   . History of acute inferior wall myocardial infarction SEPT 2008      S/P STENTING PROXIMAL TO MID RCA  . Endometrial hyperplasia   . History of breast cancer 2011--  ONCOLOGIST- DR Camelia Eng    S/P LEFT PARTIAL MASTECTOMY AND RADIATION---  NO RECURRENCE  . Dyspnea   . Anxiety   . CAD (coronary artery disease) CARDIOLOGIST-- DR Lewayne Bunting--- LAST VISIT 08-16-2011  NOTE IN EPIC    Remote inferior MI in 2008. Extensive stenting of the RCA, s/p repeat stenting of the RCA in Dec 2010 due to restenosis  . Dry eyes, bilateral   . Heart attack   . Cancer     breast  . Carotid stenosis, asymptomatic BILATERAL ICA --  0-39%  PER  DUPLEX 05-22-2011  . Clotting disorder   . Closed dislocation of right ankle, with complex fracture 03/16/2012   Past Surgical History  Procedure Date  .  Tympanostomy tube placement   . Colonoscopy JULY 2013  . US echocardiography 05-22-2011    LVSF NORMAL/ EF 60-65%  . Left partial mastectomy w/ bx axillary lymph node  09-04-2009    INVASIVE MAMMARY CARCINOMA/  STAGE T1b NO  . Coronary angioplasty with stent placement 04-19-2009  DR PETER [place]    SINGLE VESSEL OBSTRUCTIVE DISEASE/  STENTING OF IN-STENT RESTENOSIS MID RCA/ NORMAL LVF  . Coronary angioplasty with stent placement SEPT 2008--  [facility]    STENTING PROXIMAL TO MID RCA  . Cardiovascular stress test 05-28-2011    LOW RISK STUDY  . Bilateral breast lumpectomy BEFORE 2008  . Dilation and curettage of uterus 10/10/11  . Cataract extraction w/ intraocular lens  implant, bilateral   . Intrauterine device insertion    History   Social History  . Marital Status: Widowed    Spouse Name: N/A    Number of Children: 1  . Years of Education: N/A   Occupational History  . Retired Print production planner, [employer]    Social History Main Topics  . Smoking status: Former Smoker -- 2.0 packs/day for 50 years    Types: Cigarettes    Quit date: 01/11/2007  . Smokeless tobacco: Never Used  . Alcohol Use: 3.6 oz/week  6 Glasses of wine per week  . Drug Use: No  . Sexually Active: None   Other Topics Concern  . None   Social History Narrative   Daily caffeine 2-3 cups per day   Family History  Problem Relation Age of Onset  . Coronary artery disease Father     & Mother  . Heart attack Father   . Gout Father   . Osteoarthritis Mother   . Heart disease Mother     congestive heart failure  . Hypertension Sister   . Heart disease Sister   . Cancer Sister     breast  . Breast cancer      Aunts  . Uterine cancer Sister   . Colon cancer Neg Hx    Allergies  Allergen Reactions  . Codeine Hives       . Penicillins Hives  . Shellfish Allergy Other (See Comments)    Absent reflexes   Prior to Admission medications   Medication Sig Start Date End  Date Taking? Authorizing Provider  albuterol (VENTOLIN HFA) 108 (90 BASE) MCG/ACT inhaler Inhale 2 puffs into the lungs every 6 (six) hours as needed.    Yes Historical Provider, MD  alendronate (FOSAMAX) 70 MG tablet Take 70 mg by mouth every 7 (seven) days. Take with a full glass of water on an empty stomach.   Yes Historical Provider, MD  aspirin 81 MG tablet Take 81 mg by mouth daily.   Yes Historical Provider, MD  atorvastatin (LIPITOR) 40 MG tablet Take 1 tablet (40 mg total) by mouth daily. 06/11/11  Yes [name], MD  calcium-vitamin D (OSCAL WITH D) 500-200 MG-UNIT per tablet Take 1 tablet by mouth daily.   Yes Historical Provider, MD  cilostazol (PLETAL) 50 MG tablet Take 50 mg by mouth 2 (two) times daily.    Yes Historical Provider, MD  clopidogrel (PLAVIX) 75 MG tablet Take 1 tablet (75 mg total) by mouth daily. 06/11/11  Yes [name], MD  Fluticasone-Salmeterol (ADVAIR) 250-50 MCG/DOSE AEPB Inhale 1 puff into the lungs every 12 (twelve) hours. 03/15/12  Yes Oretha Milch, MD  memantine (NAMENDA) 5 MG tablet Take 10 mg by mouth 2 (two) times daily.    Yes Historical Provider, MD  metoprolol (LOPRESSOR) 50 MG tablet Take 1 tablet (50 mg total) by mouth 2 (two) times daily. 06/12/11  Yes [name], MD  mirtazapine (REMERON) 30 MG tablet Take 30 mg by mouth at bedtime.    Yes Historical Provider, MD  Multiple Vitamin (MULTIVITAMIN) tablet Take 1 tablet by mouth daily.    Yes Historical Provider, MD  nitroGLYCERIN (NITROSTAT) 0.4 MG SL tablet Place 0.4 mg under the tongue every 5 (five) minutes as needed.    Yes Historical Provider, MD  Omega-3 Fatty Acids (FISH OIL) 1200 MG CAPS Take 1 capsule by mouth daily.   Yes Historical Provider, MD  tiotropium (SPIRIVA) 18 MCG inhalation capsule Place 18 mcg into inhaler and inhale every morning.    Yes Historical Provider, MD  valsartan-hydrochlorothiazide (DIOVAN-HCT) 160-12.5 MG per tablet Take 0.5 tablets by mouth daily.    Yes  Historical Provider, MD   Dg Chest 1 View  03/16/2012  *RADIOLOGY REPORT*  Clinical Data: Left ankle fracture dislocation.  Preoperative respiratory evaluation.  History of breast cancer and endometrial cancer.  PORTABLE CHEST - 1 VIEW  Comparison: CT chest 11/21/2011, 05/14/2010, 09/20/2009.  Findings: Suboptimal inspiration with atelectasis in the lung bases.  Cardiac silhouette mildly  enlarged for technique and degree of inspiration, unchanged.  Thoracic aorta atherosclerotic, unchanged.  Hilar and mediastinal contours otherwise unremarkable. Lungs otherwise clear.  Surgical clips in the left breast.  IMPRESSION: Suboptimal inspiration accounts for bibasilar atelectasis.  No acute cardiopulmonary disease otherwise.   Original Report Authenticated By: Hulan Saas, M.D.    Dg Tibia/fibula Right  03/15/2012  *RADIOLOGY REPORT*  Clinical Data: Status post fall; obvious right lower leg deformity.  RIGHT TIBIA AND FIBULA - 2 VIEW  Comparison: None.  Findings: There is a comminuted fracture involving the distal tibia, and a displaced fracture involving the distal fibula, with associated dislocation at the tibiotalar joint.  The talus is posteriorly displaced, though it still articulates with the posterior aspect of the tibial plafond.  No additional fractures are seen.  The proximal tibia and fibula appear intact.  The knee joint demonstrates a small joint effusion, but is otherwise grossly unremarkable.  Scattered vascular calcifications are seen.  IMPRESSION:  1.  Comminuted fracture involving the distal tibia, and displaced fracture of the distal fibula, with associated dislocation at the tibiotalar joint.  Talus posteriorly displaced, though it still articulates with the posterior aspect of the tibial plafond. 2.  Small knee joint effusion noted.   Original Report Authenticated By: Tonia Ghent, M.D.    Dg Ankle 2 Views Right  03/16/2012  *RADIOLOGY REPORT*  Clinical Data: Status post reduction of  right ankle fracture/dislocation.  RIGHT ANKLE - 2 VIEW  Comparison: None.  Findings: There has been interval reduction of the previously noted fracture/dislocation of the distal tibia and fibula.  Fracture fragments are noted in grossly anatomic alignment, with approximately 3 mm of residual posterior displacement at the tibial plafond.  The talus has been reduced; no new fractures are seen.  Evaluation of the soft tissues is suboptimal due to the overlying splint.  IMPRESSION: Interval reduction of previously noted fracture/dislocation of the distal tibia and fibula, noted in grossly anatomic alignment.  No new fractures seen.   Original Report Authenticated By: Tonia Ghent, M.D.    Dg Ankle Complete Right  03/15/2012  *RADIOLOGY REPORT*  Clinical Data: Status post fall; right lower leg deformity.  RIGHT ANKLE - COMPLETE 3+ VIEW  Comparison: None.  Findings: There are comminuted fractures involving the distal tibia and fibula, with posterior displacement at both fracture sites, and associated dislocation of the tibiotalar joint.  The talus articulates with the posterior aspect of the tibial plafond, but there is superior and posterior displacement of this fragment.  No additional fractures are identified.  Scattered vascular calcifications are seen.  Soft tissue swelling is noted about the fracture sites.  IMPRESSION:  1.  Comminuted fractures involving the distal tibia and fibula, with posterior displacement at both fracture sites, and associated dislocation of the tibiotalar joint. 2.  Scattered vascular calcifications seen.   Original Report Authenticated By: Tonia Ghent, M.D.    Ct Head Wo Contrast  03/16/2012  *RADIOLOGY REPORT*  Clinical Data: Status post fall; concern for head injury.  CT HEAD WITHOUT CONTRAST  Technique:  Contiguous axial images were obtained from the base of the skull through the vertex without contrast.  Comparison: CT of the head performed 05/22/2011  Findings: There is no  evidence of acute infarction, mass lesion, or intra- or extra-axial hemorrhage on CT.  Multiple chronic infarcts are again noted, at the right frontal parietal, left parietal and bilateral occipital regions, unchanged in appearance from the prior study.  Diffuse periventricular and subcortical white matter change  likely reflects small vessel ischemic microangiopathy.  Prominence of the sulci suggests mild cortical volume loss.  Cerebellar atrophy is noted, with multiple small chronic cerebellar infarcts.  The brainstem and fourth ventricle are within normal limits.  The basal ganglia are unremarkable in appearance.  The cerebral hemispheres demonstrate grossly normal gray-white differentiation. No mass effect or midline shift is seen.   There is no evidence of fracture; visualized osseous structures are unremarkable in appearance.  The orbits are within normal limits.  The paranasal sinuses and mastoid air cells are well- aerated.  No significant soft tissue abnormalities are seen.  IMPRESSION:  1.  No evidence of traumatic intracranial injury or fracture. 2.  Multiple chronic bilateral infarcts again noted.  Mild cortical volume loss.   Original Report Authenticated By: Tonia Ghent, M.D.    Ct Ankle Right Wo Contrast  03/16/2012  *RADIOLOGY REPORT*  Clinical Data: Fracture.  CT OF THE RIGHT ANKLE WITH CONTRAST  Technique:  Multidetector CT imaging was performed following the standard protocol during bolus administration of intravenous contrast.  Comparison: 03/15/2012  Findings: Longitudinal fracture of the distal tibia extends from the posterior diaphyseal cortex with dominant fracture plane coronally oriented, extending to the midportion of the tibial distal articular surface with mild comminution along the medial malleolar margin of the fracture plane.  Medially the fracture extends into the anterior portion of the medial malleolus.  There is a similarly oriented oblique longitudinal coronal fracture of the  distal fibula with mild comminution along the fracture plane. Both the distal tibial and distal fibular bone fracture planes are separated by 305 mm, including the distal articular surface. Clearly both fractures extend into the distal tibiofibular joint. Several small fragments are present along the distal margins of the fracture planes.  Subtalar joints appear intact.  No talar dome fracture noted. Subtalar joints intact.  Calcaneus and cuboid appear unremarkable, and no separation at the Lisfranc joint is observed.  Navicular unremarkable.  No tendon entrapment observed.  IMPRESSION:  1.  Primarily coronally oriented fractures of the distal tibia and fibula, with 3-5 mm of separation along the dominant fracture planes and mild comminution along the dominant fracture planes which extends to the distal tibial and fibular articular surfaces.   Original Report Authenticated By: Gaylyn Rong, M.D.     Positive ROS: All other systems have been reviewed and were otherwise negative with the exception of those mentioned in the HPI and as above.  Physical Exam: General: Alert, no acute distress Cardiovascular: No significant pedal edema, with the exception of some swelling around her toes. Respiratory: No cyanosis, no use of accessory musculature GI: No organomegaly, abdomen is soft and non-tender Skin: No lesions in the area of chief complaint, although there is a splint intact holding her dislocated ankle. Neurologic: Sensation intact distally Psychiatric: Patient is competent for consent with normal mood and affect, although she does have some mild dementia. Lymphatic: No axillary or cervical lymphadenopathy  MUSCULOSKELETAL: Right lower extremity is in a splint, sensation intact throughout toes. Good capillary refill.  Assessment: Right ankle fracture dislocation  Plan: She has multiple other extremely dangerous risk factors when considering surgery. This includes her episodic tachycardia, as  well as her history of endometrial cancer, which is currently ongoing and is being treated conservatively, as well as her COPD and coronary disease.  Nonetheless, without surgical intervention she will not deal regaining ambulatory function. I discussed the options of nonsurgical versus surgical intervention, and the family has elected for surgery. She  is currently living independently, however this may not be possible in the future. I've also discussed her case with Dr. [name], who is going to help assisting coordinate the optimization of the medical issues prior to proceeding. All questions been answered.     Felcia Huebert P, MD Cell [phone] Pager [phone]  03/16/2012 7:48 AM

## 2012-03-16 NOTE — Preoperative (Signed)
Beta Blockers   Reason not to administer Beta Blockers:Not Applicable 

## 2012-03-16 NOTE — Anesthesia Postprocedure Evaluation (Signed)
Anesthesia Post Note  Patient: Melinda Mora  Procedure(s) Performed: Procedure(s) (LRB): OPEN REDUCTION INTERNAL FIXATION (ORIF) ANKLE FRACTURE (Right)  Anesthesia type: general  Patient location: PACU  Post pain: Pain level controlled  Post assessment: Patient's Cardiovascular Status Stable  Last Vitals:  Filed Vitals:   03/16/12 1300  BP:   Pulse: 94  Temp:   Resp: 23    Post vital signs: Reviewed and stable  Level of consciousness: sedated  Complications: No apparent anesthesia complications

## 2012-03-16 NOTE — Progress Notes (Signed)
Report to L. Dossie Arbour RN as lunch relief.  Pt on hold to go back to 5N

## 2012-03-16 NOTE — Anesthesia Procedure Notes (Addendum)
Anesthesia Regional Block:  Popliteal block  Pre-Anesthetic Checklist: ,, timeout performed, Correct Patient, Correct Site, Correct Laterality, Correct Procedure,, site marked, risks and benefits discussed, Surgical consent,  Pre-op evaluation,  At surgeon's request and post-op pain management  Laterality: Right  Prep: chloraprep       Needles:  Injection technique: Single-shot  Needle Type: Echogenic Stimulator Needle          Additional Needles:  Procedures: ultrasound guided (picture in chart) and nerve stimulator Popliteal block  Nerve Stimulator or Paresthesia:  Response: plantar flexion, 0.45 mA,   Additional Responses:   Narrative:  Start time: 03/16/2012 9:38 AM End time: 03/16/2012 9:48 AM Injection made incrementally with aspirations every 5 mL.  Performed by: Personally  Anesthesiologist: J. Adonis Huguenin, MD  Additional Notes: A functioning IV was confirmed and monitors were applied.  Sterile prep and drape, hand hygiene and sterile gloves were used.  Negative aspiration and test dose prior to incremental administration of local anesthetic. The patient tolerated the procedure well.Ultrasound  guidance: relevant anatomy identified, needle position confirmed, local anesthetic spread visualized around nerve(s), vascular puncture avoided.  Image printed for medical record.   Popliteal block Procedure Name: LMA Insertion Date/Time: 03/16/2012 10:35 AM Performed by: Jerilee Hoh Pre-anesthesia Checklist: Patient identified, Emergency Drugs available, Suction available and Patient being monitored Patient Re-evaluated:Patient Re-evaluated prior to inductionOxygen Delivery Method: Circle system utilized Preoxygenation: Pre-oxygenation with 100% oxygen Intubation Type: IV induction Ventilation: Mask ventilation without difficulty LMA: LMA inserted LMA Size: 4.0 Tube type: Oral Number of attempts: 1 Placement Confirmation: positive ETCO2 and breath sounds checked-  equal and bilateral Tube secured with: Tape Dental Injury: Teeth and Oropharynx as per pre-operative assessment

## 2012-03-16 NOTE — Progress Notes (Signed)
Utilization review completed. Myracle Febres, RN, BSN. 

## 2012-03-17 ENCOUNTER — Encounter (HOSPITAL_COMMUNITY): Payer: Medicare Other

## 2012-03-17 ENCOUNTER — Encounter (HOSPITAL_COMMUNITY): Payer: Self-pay | Admitting: Orthopedic Surgery

## 2012-03-17 DIAGNOSIS — Z789 Other specified health status: Secondary | ICD-10-CM

## 2012-03-17 DIAGNOSIS — Z9181 History of falling: Secondary | ICD-10-CM

## 2012-03-17 DIAGNOSIS — J449 Chronic obstructive pulmonary disease, unspecified: Secondary | ICD-10-CM

## 2012-03-17 LAB — BASIC METABOLIC PANEL
Calcium: 8.9 mg/dL (ref 8.4–10.5)
Creatinine, Ser: 0.73 mg/dL (ref 0.50–1.10)
GFR calc non Af Amer: 82 mL/min — ABNORMAL LOW (ref >90)
Glucose, Bld: 170 mg/dL — ABNORMAL HIGH (ref 70–99)
Potassium: 3.8 mEq/L (ref 3.5–5.1)

## 2012-03-17 LAB — CBC
HCT: 37.4 % (ref 36.0–46.0)
MCV: 94.4 fL (ref 78.0–100.0)
Platelets: 207 10*3/uL (ref 150–400)
RBC: 3.96 MIL/uL (ref 3.87–5.11)
RDW: 15.4 % (ref 11.5–15.5)
WBC: 12.8 10*3/uL — ABNORMAL HIGH (ref 4.0–10.5)

## 2012-03-17 MED ORDER — LEVOFLOXACIN 750 MG PO TABS
750.0000 mg | ORAL_TABLET | Freq: Every day | ORAL | Status: DC
  Filled 2012-03-17: qty 1

## 2012-03-17 MED ORDER — MOXIFLOXACIN HCL 400 MG PO TABS: 400.0000 mg | ORAL_TABLET | Freq: Every day | ORAL | Status: DC

## 2012-03-17 MED ORDER — LEVOFLOXACIN 500 MG PO TABS
500.0000 mg | ORAL_TABLET | Freq: Every day | ORAL | Status: DC
  Administered 2012-03-17 – 2012-03-19 (×3): 500 mg via ORAL
  Filled 2012-03-17 (×4): qty 1

## 2012-03-17 MED ORDER — PANTOPRAZOLE SODIUM 40 MG PO TBEC
40.0000 mg | DELAYED_RELEASE_TABLET | Freq: Every day | ORAL | Status: DC
  Administered 2012-03-17 – 2012-03-19 (×3): 40 mg via ORAL
  Filled 2012-03-17 (×2): qty 1

## 2012-03-17 NOTE — Progress Notes (Signed)
Nutrition Follow-up  Intervention:   1.  General healthful diet; assessment and interventions/recommendations provided to pt and daughter re: improving intake and consuming high protein foods.  Pt and daughter verbalize understanding.  Provided pt and daughter with high-protein foods list.  Assessment:   Pt admitted s/p fall for ankle fx.  RD consulted for assessment of needs.  Pt and daughter report wt gain of 60 lbs since pt quit smoking in 2008.  At that time, pt states she weighed 98 lbs.  Per daughter, pt snacks frequently throughout the day and has difficulty consuming large meals.  Pt reports this is in part due to poor appetite as well as shortness of breath.   Pt has been instructed to consume more protein and is interested in improving diet.  She does feel as though she eats too much 'junk food' with her constant snacking.   Typical intake: Breakfast (9a): cheese toast, yogurt, or muffins Lunch: (12p): snacks, sandwich Snacks:  Crackers  Dinner: (5:30p): Entree- meat and vegetables Snacks: ice cream, crackers  RD was able to discuss nutrition-related goals and well as provide recommendations and suggestions for improving.  Diet Order:  Regular  Meds: Scheduled Meds:   . [COMPLETED] sodium chloride   Intravenous STAT  . aspirin  81 mg Oral Daily  . atorvastatin  40 mg Oral q1800  . calcium-vitamin D  1 tablet Oral Daily  . cilostazol  50 mg Oral BID  . docusate sodium  100 mg Oral BID  . enoxaparin (LOVENOX) injection  40 mg Subcutaneous Q24H  . irbesartan  75 mg Oral Daily   And  . hydrochlorothiazide  6.25 mg Oral Daily  . levofloxacin  500 mg Oral Daily  . memantine  10 mg Oral BID  . metoprolol  50 mg Oral BID  . mirtazapine  30 mg Oral QHS  . mometasone-formoterol  2 puff Inhalation BID  . multivitamin with minerals  1 tablet Oral Daily  . omega-3 acid ethyl esters  1 g Oral Daily  . pantoprazole  40 mg Oral Q1200  . senna  1 tablet Oral BID  . tiotropium  18  mcg Inhalation q morning - 10a  . [COMPLETED] vancomycin  1,000 mg Intravenous Q12H  . [DISCONTINUED] levofloxacin  750 mg Oral Daily  . [DISCONTINUED] moxifloxacin  400 mg Oral q1800   Continuous Infusions:   . 0.9 % NaCl with KCl 20 mEq / L 75 mL/hr at 03/16/12 2144  . [DISCONTINUED] lactated ringers 50 mL/hr at 03/16/12 0918   PRN Meds:.acetaminophen, acetaminophen, albuterol, albuterol, alum & mag hydroxide-simeth, HYDROcodone-acetaminophen, HYDROmorphone (DILAUDID) injection, ipratropium, menthol-cetylpyridinium, methocarbamol (ROBAXIN) IV, methocarbamol, metoCLOPramide (REGLAN) injection, metoCLOPramide, morphine injection, morphine injection, nitroGLYCERIN, ondansetron (ZOFRAN) IV, ondansetron, phenol, polyethylene glycol, promethazine, sorbitol, zolpidem [DISCONTINUED] fentaNYL, [DISCONTINUED] midazolam   CMP     Component Value Date/Time   NA 139 03/17/2012 0800   K 3.8 03/17/2012 0800   CL 104 03/17/2012 0800   CO2 22 03/17/2012 0800   GLUCOSE 170* 03/17/2012 0800   BUN 11 03/17/2012 0800   CREATININE 0.73 03/17/2012 0800   CALCIUM 8.9 03/17/2012 0800   PROT 6.0 10/18/2011 2300   ALBUMIN 3.2* 10/18/2011 2300   AST 26 10/18/2011 2300   ALT 23 10/18/2011 2300   ALKPHOS 109 10/18/2011 2300   BILITOT 0.3 10/18/2011 2300   GFRNONAA 82* 03/17/2012 0800   GFRAA >90 03/17/2012 0800    CBG (last 3)   Basename 03/17/12 0637  GLUCAP 114*  Intake/Output Summary (Last 24 hours) at 03/17/12 1425 Last data filed at 03/17/12 0500  Gross per 24 hour  Intake 1072.5 ml  Output   1600 ml  Net -527.5 ml    Weight Status:  No new wt  Re-estimated needs:  1990-2280 kcal, 92-105g protein  Nutrition Dx:  Increased nutrient needs, ongoing  Monitor:   1. Food/Beverage; resume of PO diet with tolerance.  Met, continue. 2. Wt/wt change; monitor trends.  Ongoing.   Loyce Dys, MS RD LDN Clinical Inpatient Dietitian Pager: [phone] Weekend/After hours pager: [phone]

## 2012-03-17 NOTE — Progress Notes (Signed)
Subjective:  Patient reports pain as moderate. She apparently has had episodes of V. tach according to the nursing, of which Dr. Kennedy Bucker is aware. I will defer to his management. She does have a history of this. She is currently comfortable and feels "normal".  Objective:   VITALS:   Filed Vitals:   03/17/12 0000 03/17/12 0226 03/17/12 0400 03/17/12 0519  BP:  134/60  117/64  Pulse:  86  93  Temp:  98.3 F (36.8 C)  98.7 F (37.1 C)  TempSrc:  Oral  Oral  Resp: 18 18 18 18   Weight:      SpO2: 97% 92% 92% 92%    Her splint is intact, and sensation is intact in the toes. Good capillary refill.  LABS  Results for orders placed during the hospital encounter of 03/15/12 (from the past 24 hour(s))  TYPE AND SCREEN     Status: Normal   Collection Time   03/16/12  3:20 PM      Component Value Range   ABO/RH(D) A NEG     Antibody Screen NEG     Sample Expiration 03/19/2012    ABO/RH     Status: Normal   Collection Time   03/16/12  3:20 PM      Component Value Range   ABO/RH(D) A NEG    GLUCOSE, CAPILLARY     Status: Abnormal   Collection Time   03/17/12  6:37 AM      Component Value Range   Glucose-Capillary 114 (*) 70 - 99 mg/dL  CBC     Status: Abnormal   Collection Time   03/17/12  8:00 AM      Component Value Range   WBC 12.8 (*) 4.0 - 10.5 K/uL   RBC 3.96  3.87 - 5.11 MIL/uL   Hemoglobin 11.8 (*) 12.0 - 15.0 g/dL   HCT 40.9  81.1 - 91.4 %   MCV 94.4  78.0 - 100.0 fL   MCH 29.8  26.0 - 34.0 pg   MCHC 31.6  30.0 - 36.0 g/dL   RDW 78.2  95.6 - 21.3 %   Platelets 207  150 - 400 K/uL    Dg Chest 1 View  03/16/2012  *RADIOLOGY REPORT*  Clinical Data: Left ankle fracture dislocation.  Preoperative respiratory evaluation.  History of breast cancer and endometrial cancer.  PORTABLE CHEST - 1 VIEW  Comparison: CT chest 11/21/2011, 05/14/2010, 09/20/2009.  Findings: Suboptimal inspiration with atelectasis in the lung bases.  Cardiac silhouette mildly enlarged for  technique and degree of inspiration, unchanged.  Thoracic aorta atherosclerotic, unchanged.  Hilar and mediastinal contours otherwise unremarkable. Lungs otherwise clear.  Surgical clips in the left breast.  IMPRESSION: Suboptimal inspiration accounts for bibasilar atelectasis.  No acute cardiopulmonary disease otherwise.   Original Report Authenticated By: Hulan Saas, M.D.    Dg Tibia/fibula Right  03/15/2012  *RADIOLOGY REPORT*  Clinical Data: Status post fall; obvious right lower leg deformity.  RIGHT TIBIA AND FIBULA - 2 VIEW  Comparison: None.  Findings: There is a comminuted fracture involving the distal tibia, and a displaced fracture involving the distal fibula, with associated dislocation at the tibiotalar joint.  The talus is posteriorly displaced, though it still articulates with the posterior aspect of the tibial plafond.  No additional fractures are seen.  The proximal tibia and fibula appear intact.  The knee joint demonstrates a small joint effusion, but is otherwise grossly unremarkable.  Scattered vascular calcifications are seen.  IMPRESSION:  1.  Comminuted fracture involving the distal tibia, and displaced fracture of the distal fibula, with associated dislocation at the tibiotalar joint.  Talus posteriorly displaced, though it still articulates with the posterior aspect of the tibial plafond. 2.  Small knee joint effusion noted.   Original Report Authenticated By: Tonia Ghent, M.D.    Dg Ankle 2 Views Right  03/16/2012  *RADIOLOGY REPORT*  Clinical Data: Fracture  DG C-ARM 1-60 MIN,RIGHT ANKLE - 2 VIEW  Comparison: 03/15/2012  Findings: The patient is status post open reduction internal fixation of distal tibia, distal fibular, and posterior malleolar fractures.  Improved position and alignment.  Restoration of ankle mortise.  IMPRESSION: As above.   Original Report Authenticated By: Davonna Belling, M.D.    Dg Ankle 2 Views Right  03/16/2012  *RADIOLOGY REPORT*  Clinical Data:  Status post reduction of right ankle fracture/dislocation.  RIGHT ANKLE - 2 VIEW  Comparison: None.  Findings: There has been interval reduction of the previously noted fracture/dislocation of the distal tibia and fibula.  Fracture fragments are noted in grossly anatomic alignment, with approximately 3 mm of residual posterior displacement at the tibial plafond.  The talus has been reduced; no new fractures are seen.  Evaluation of the soft tissues is suboptimal due to the overlying splint.  IMPRESSION: Interval reduction of previously noted fracture/dislocation of the distal tibia and fibula, noted in grossly anatomic alignment.  No new fractures seen.   Original Report Authenticated By: Tonia Ghent, M.D.    Dg Ankle Complete Right  03/15/2012  *RADIOLOGY REPORT*  Clinical Data: Status post fall; right lower leg deformity.  RIGHT ANKLE - COMPLETE 3+ VIEW  Comparison: None.  Findings: There are comminuted fractures involving the distal tibia and fibula, with posterior displacement at both fracture sites, and associated dislocation of the tibiotalar joint.  The talus articulates with the posterior aspect of the tibial plafond, but there is superior and posterior displacement of this fragment.  No additional fractures are identified.  Scattered vascular calcifications are seen.  Soft tissue swelling is noted about the fracture sites.  IMPRESSION:  1.  Comminuted fractures involving the distal tibia and fibula, with posterior displacement at both fracture sites, and associated dislocation of the tibiotalar joint. 2.  Scattered vascular calcifications seen.   Original Report Authenticated By: Tonia Ghent, M.D.    Ct Head Wo Contrast  03/16/2012  *RADIOLOGY REPORT*  Clinical Data: Status post fall; concern for head injury.  CT HEAD WITHOUT CONTRAST  Technique:  Contiguous axial images were obtained from the base of the skull through the vertex without contrast.  Comparison: CT of the head performed 05/22/2011   Findings: There is no evidence of acute infarction, mass lesion, or intra- or extra-axial hemorrhage on CT.  Multiple chronic infarcts are again noted, at the right frontal parietal, left parietal and bilateral occipital regions, unchanged in appearance from the prior study.  Diffuse periventricular and subcortical white matter change likely reflects small vessel ischemic microangiopathy.  Prominence of the sulci suggests mild cortical volume loss.  Cerebellar atrophy is noted, with multiple small chronic cerebellar infarcts.  The brainstem and fourth ventricle are within normal limits.  The basal ganglia are unremarkable in appearance.  The cerebral hemispheres demonstrate grossly normal gray-white differentiation. No mass effect or midline shift is seen.   There is no evidence of fracture; visualized osseous structures are unremarkable in appearance.  The orbits are within normal limits.  The paranasal sinuses and mastoid air cells are well- aerated.  No significant soft tissue abnormalities are seen.  IMPRESSION:  1.  No evidence of traumatic intracranial injury or fracture. 2.  Multiple chronic bilateral infarcts again noted.  Mild cortical volume loss.   Original Report Authenticated By: Tonia Ghent, M.D.    Ct Ankle Right Wo Contrast  03/16/2012  *RADIOLOGY REPORT*  Clinical Data: Fracture.  CT OF THE RIGHT ANKLE WITH CONTRAST  Technique:  Multidetector CT imaging was performed following the standard protocol during bolus administration of intravenous contrast.  Comparison: 03/15/2012  Findings: Longitudinal fracture of the distal tibia extends from the posterior diaphyseal cortex with dominant fracture plane coronally oriented, extending to the midportion of the tibial distal articular surface with mild comminution along the medial malleolar margin of the fracture plane.  Medially the fracture extends into the anterior portion of the medial malleolus.  There is a similarly oriented oblique longitudinal  coronal fracture of the distal fibula with mild comminution along the fracture plane. Both the distal tibial and distal fibular bone fracture planes are separated by 305 mm, including the distal articular surface. Clearly both fractures extend into the distal tibiofibular joint. Several small fragments are present along the distal margins of the fracture planes.  Subtalar joints appear intact.  No talar dome fracture noted. Subtalar joints intact.  Calcaneus and cuboid appear unremarkable, and no separation at the Lisfranc joint is observed.  Navicular unremarkable.  No tendon entrapment observed.  IMPRESSION:  1.  Primarily coronally oriented fractures of the distal tibia and fibula, with 3-5 mm of separation along the dominant fracture planes and mild comminution along the dominant fracture planes which extends to the distal tibial and fibular articular surfaces.   Original Report Authenticated By: Gaylyn Rong, M.D.    Dg C-arm 1-60 Min  03/16/2012  *RADIOLOGY REPORT*  Clinical Data: Fracture  DG C-ARM 1-60 MIN,RIGHT ANKLE - 2 VIEW  Comparison: 03/15/2012  Findings: The patient is status post open reduction internal fixation of distal tibia, distal fibular, and posterior malleolar fractures.  Improved position and alignment.  Restoration of ankle mortise.  IMPRESSION: As above.   Original Report Authenticated By: Davonna Belling, M.D.     Assessment/Plan: 1 Day Post-Op   Principal Problem:  *Closed dislocation of right ankle, with complex fracture   Nonweightbearing. Elevate. Lovenox for 3 weeks for DVT prophylaxis. She will then plan to restart her aspirin and Plavix, although I will continue with her baby aspirin for the time being. She will likely need skilled nursing placement. We will reassess after physical therapy.   Melinda Mora 03/17/2012, 8:43 AM   Teryl Lucy, MD Cell [phone] Pager [phone]

## 2012-03-17 NOTE — Progress Notes (Signed)
Orthopedic Tech Progress Note Patient Details:  Melinda Mora 1934-03-28 [id number]  Ortho Devices Type of Ortho Device: Short leg splint;Stirrup splint;Ace wrap Ortho Device/Splint Location: TRAPEZE BAR Ortho Device/Splint Interventions: Application   Cammer, Mickie Bail 03/17/2012, 11:22 AM

## 2012-03-17 NOTE — Evaluation (Signed)
Occupational Therapy Evaluation Patient Details Name: Melinda Mora MRN: [id number] DOB: Jul 06, 1933 Today's Date: 03/17/2012 Time: 4098-1191 OT Time Calculation (min): 31 min  OT Assessment / Plan / Recommendation Clinical Impression  Pleasant 78 yr old female admitted for right ankle fx and subsequent ORIF.  Currently is non weightbearing over the RLE thus requring a significant increase in ADL dependence.  Pt had intermittent assistance at home but not 24 hour.  Feel she will benefit from acute OT services to help increase independence and help educate on specific techniques to maintain NWBing status in the RLE.  Feel she will need extensive follow-up rehab at SNF level at discharge.       OT Assessment  Patient needs continued OT Services    Follow Up Recommendations  SNF;Supervision/Assistance - 24 hour    Barriers to Discharge Decreased caregiver support    Equipment Recommendations  None recommended by OT       Frequency  Min 2X/week    Precautions / Restrictions Precautions Precautions: Fall Restrictions Weight Bearing Restrictions: Yes RLE Weight Bearing: Non weight bearing   Pertinent Vitals/Pain O2 93% on 3Ls nasal cannula    ADL  Eating/Feeding: Simulated;Independent Where Assessed - Eating/Feeding: Chair Grooming: Simulated;Set up Where Assessed - Grooming: Supported sitting Upper Body Bathing: Simulated;Set up Where Assessed - Upper Body Bathing: Supported sitting Lower Body Bathing: Simulated;+2 Total assistance Lower Body Bathing: Patient Percentage: 40% Where Assessed - Lower Body Bathing: Supported sit to stand Upper Body Dressing: Simulated;Set up Where Assessed - Upper Body Dressing: Supported sitting Lower Body Dressing: Simulated;+2 Total assistance Lower Body Dressing: Patient Percentage: 40% Where Assessed - Lower Body Dressing: Supported sit to stand Toilet Transfer: Simulated;+1 Total assistance Toilet Transfer Method: Curator: Other (comment) (simulated EOB, pt declined attempt to use 3:1) Toileting - Clothing Manipulation and Hygiene: +2 Total assistance Toileting - Clothing Manipulation and Hygiene: Patient Percentage: 40% Where Assessed - Toileting Clothing Manipulation and Hygiene: Sit to stand from 3-in-1 or toilet Tub/Shower Transfer Method: Not assessed Transfers/Ambulation Related to ADLs: Pt required total assist for stand pivot transfer.  Unable to ambulate at this time. ADL Comments: Pt able to reach the LLE for donning gripper sock, however needs total+2 for any standing aspect of bathing or dressing.  Demonstrates increased fear of falling and is currently unable to maintain NWBing status.  Will need SNF for follow-up.    OT Diagnosis: Generalized weakness;Acute pain  OT Problem List: Decreased strength;Decreased activity tolerance;Decreased knowledge of use of DME or AE;Decreased knowledge of precautions;Impaired balance (sitting and/or standing);Pain OT Treatment Interventions: Self-care/ADL training;Therapeutic activities;DME and/or AE instruction;Patient/family education;Balance training   OT Goals Acute Rehab OT Goals OT Goal Formulation: With patient/family ADL Goals Pt Will Perform Lower Body Bathing: with mod assist;Sit to stand from chair;Sit to stand from bed ADL Goal: Lower Body Bathing - Progress: Goal set today Pt Will Transfer to Toilet: with DME;3-in-1;with mod assist;Stand pivot transfer;Maintaining weight bearing status ADL Goal: Toilet Transfer - Progress: Goal set today Pt Will Perform Toileting - Clothing Manipulation: with mod assist;Sitting on 3-in-1 or toilet;Standing ADL Goal: Toileting - Clothing Manipulation - Progress: Goal set today Pt Will Perform Toileting - Hygiene: with mod assist;Sit to stand from 3-in-1/toilet ADL Goal: Toileting - Hygiene - Progress: Goal set today Miscellaneous OT Goals Miscellaneous OT Goal #1: Pt will transfer from supine to  sit EOB with min assist in preparation for selfcare or toileting tasks. OT Goal: Miscellaneous Goal #1 - Progress: Goal  set today Miscellaneous OT Goal #2: Pt will perform bilateral UE light therex to help increase bilateral shoulder strength  to 4/5 for greater use with functional transfers OT Goal: Miscellaneous Goal #2 - Progress: Goal set today  Visit Information  Last OT Received On: 03/17/12 Assistance Needed: +2    Subjective Data  Subjective: "I'm afraid of falling." Patient Stated Goal: Did not state but agreeable to go back to bed since she had been sitting up for 3-4 hours.   Prior Functioning     Home Living Lives With: Alone Available Help at Discharge: Available PRN/intermittently Type of Home: Other (Comment) (Townhouse) Home Access: Stairs to enter [employer] of Steps: 1 Entrance Stairs-Rails: None Home Layout: One level Bathroom Shower/Tub: Forensic scientist: Standard Home Adaptive Equipment: Environmental consultant - four wheeled Prior Function Level of Independence: Independent with assistive device(s) (Use 4 wheel RW occassionally throughout the day.) Able to Take Stairs?: Yes Driving: No Vocation: Retired Musician: No difficulties Dominant Hand: Right         Vision/Perception Vision - Assessment Eye Alignment: Within Functional Limits Vision Assessment: Vision not tested Perception Perception: Within Functional Limits Praxis Praxis: Intact   Cognition  Overall Cognitive Status: Appears within functional limits for tasks assessed/performed Arousal/Alertness: Awake/alert Orientation Level: Appears intact for tasks assessed Behavior During Session: Anxious    Extremity/Trunk Assessment Right Upper Extremity Assessment RUE ROM/Strength/Tone: Deficits RUE ROM/Strength/Tone Deficits: AROM WFLS for all joints.  Shoulder strength 3+/5 bilaterally and elbow flexion/extension 3+/5 bilaterally as well. RUE  Sensation: WFL - Light Touch RUE Coordination: WFL - gross/fine motor Left Upper Extremity Assessment LUE ROM/Strength/Tone: Deficits LUE ROM/Strength/Tone Deficits: AROM WFLS for all joints.  Shoulder strength 3+/5 bilaterally and elbow flexion/extension 3+/5 bilaterally as well. LUE Sensation: WFL - Light Touch LUE Coordination: WFL - gross/fine motor Trunk Assessment Trunk Assessment: Normal     Mobility Bed Mobility Bed Mobility: Sit to Supine Sit to Supine: 1: +1 Total assist Details for Bed Mobility Assistance: Assist for lifting the RLE into the bed. Transfers Transfers: Sit to Stand Sit to Stand: 1: +1 Total assist Stand to Sit: 1: +1 Total assist Details for Transfer Assistance: Mod instructional cueing to sequence sit to stand including hand placement.           Balance Balance Balance Assessed: Yes Static Standing Balance Static Standing - Balance Support: Right upper extremity supported;Left upper extremity supported Static Standing - Level of Assistance: 1: +1 Total assist   End of Session OT - End of Session Activity Tolerance: Patient limited by pain Patient left: in bed;with call bell/phone within reach;with bed alarm set;with family/visitor present Nurse Communication: Mobility status    Tierney Behl OTR/L Pager number [phone] 03/17/2012, 3:25 PM

## 2012-03-17 NOTE — Consult Note (Signed)
PULMONARY  / CRITICAL CARE MEDICINE  Name: Melinda Mora MRN: [id number] DOB: 17-Jul-1933    LOS: 2  REFERRING PROVIDER:  [name] PULM MD: [name]  CHIEF COMPLAINT:  Cough, copd post op leg fx care  BRIEF PATIENT DESCRIPTION: 77yo WF well known Copd Gold B oxygen dependent. FeV1 70%.  Fell in home with RLE fx.  Hx of frequent falls in home.  She is in the [facility] program Medicare program.    Pt now with dyspnea on exertion and cough productive of brown green mucus.  Pt underwent leg fx repair 11/5  PCCM asked to see pt postop.   Pt denies any significant sore throat, nasal congestion or , fever, chills, sweats, unintended weight loss, pleurtic or exertional chest pain, orthopnea PND, or leg swelling Pt denies any increase in rescue therapy over baseline, denies waking up needing it or having any early am or nocturnal exacerbations of coughing/wheezing/or dyspnea. Pt also denies any obvious fluctuation in symptoms with  weather or environmental change or other alleviating or aggravating factors    LINES / TUBES: PIV  CULTURES: none  ANTIBIOTICS: Post op vanco 11/5>>11/6  SIGNIFICANT EVENTS:  Pt succesfully underwent ORIF of RLE fx 11/5  LEVEL OF CARE:  Floor PRIMARY SERVICE:  Ortho CONSULTANTSTimothy Lasso.  PCCM CODE STATUS:  full DIET:  regular DVT Px:  LMWH GI Px:  PPI   PAST MEDICAL HISTORY :  Past Medical History  Diagnosis Date  . Hyperlipidemia   . Hypertension   . Depression   . Osteoarthritis   . Allergic rhinitis   . Raynaud disease   . Adenomatous polyp 03/2004  . Diverticulosis   . Syncope Jan 2013    NSVT noted on event monitor  . COPD (chronic obstructive pulmonary disease) with emphysema   . Pulmonary nodule, right x2  middle and lower lobes--  stable per follow-ip ct  jan 2012  . S/P primary angioplasty with coronary stent   . History of acute inferior wall myocardial infarction SEPT 2008      S/P STENTING PROXIMAL TO MID RCA  . Endometrial  hyperplasia   . History of breast cancer 2011--  ONCOLOGIST- DR Camelia Eng    S/P LEFT PARTIAL MASTECTOMY AND RADIATION---  NO RECURRENCE  . Dyspnea   . Anxiety   . CAD (coronary artery disease) CARDIOLOGIST-- DR Lewayne Bunting--- LAST VISIT 08-16-2011  NOTE IN EPIC    Remote inferior MI in 2008. Extensive stenting of the RCA, s/p repeat stenting of the RCA in Dec 2010 due to restenosis  . Dry eyes, bilateral   . Heart attack   . Cancer     breast  . Carotid stenosis, asymptomatic BILATERAL ICA --  0-39%  PER  DUPLEX 05-22-2011  . Clotting disorder   . Closed dislocation of right ankle, with complex fracture 03/16/2012   Past Surgical History  Procedure Date  . Tympanostomy tube placement   . Colonoscopy JULY 2013  . US echocardiography 05-22-2011    LVSF NORMAL/ EF 60-65%  . Left partial mastectomy w/ bx axillary lymph node  09-04-2009    INVASIVE MAMMARY CARCINOMA/  STAGE T1b NO  . Coronary angioplasty with stent placement 04-19-2009  DR PETER [place]    SINGLE VESSEL OBSTRUCTIVE DISEASE/  STENTING OF IN-STENT RESTENOSIS MID RCA/ NORMAL LVF  . Coronary angioplasty with stent placement SEPT 2008--  [facility]    STENTING PROXIMAL TO MID RCA  . Cardiovascular stress test 05-28-2011  LOW RISK STUDY  . Bilateral breast lumpectomy BEFORE 2008  . Dilation and curettage of uterus 10/10/11  . Cataract extraction w/ intraocular lens  implant, bilateral   . Intrauterine device insertion   . Orif ankle fracture 03/16/2012    RIGHT ANKLE   Prior to Admission medications   Medication Sig Start Date End Date Taking? Authorizing Provider  albuterol (VENTOLIN HFA) 108 (90 BASE) MCG/ACT inhaler Inhale 2 puffs into the lungs every 6 (six) hours as needed.    Yes Historical Provider, MD  aspirin 81 MG tablet Take 81 mg by mouth daily.   Yes Historical Provider, MD  atorvastatin (LIPITOR) 40 MG tablet Take 1 tablet (40 mg total) by mouth daily. 06/11/11  Yes [name], MD    calcium-vitamin D (OSCAL WITH D) 500-200 MG-UNIT per tablet Take 1 tablet by mouth daily.   Yes Historical Provider, MD  cilostazol (PLETAL) 50 MG tablet Take 50 mg by mouth 2 (two) times daily.    Yes Historical Provider, MD  Fluticasone-Salmeterol (ADVAIR) 250-50 MCG/DOSE AEPB Inhale 1 puff into the lungs every 12 (twelve) hours. 03/15/12  Yes Oretha Milch, MD  memantine (NAMENDA) 5 MG tablet Take 10 mg by mouth 2 (two) times daily.    Yes Historical Provider, MD  metoprolol (LOPRESSOR) 50 MG tablet Take 1 tablet (50 mg total) by mouth 2 (two) times daily. 06/12/11  Yes [name], MD  mirtazapine (REMERON) 30 MG tablet Take 30 mg by mouth at bedtime.    Yes Historical Provider, MD  Multiple Vitamin (MULTIVITAMIN) tablet Take 1 tablet by mouth daily.    Yes Historical Provider, MD  nitroGLYCERIN (NITROSTAT) 0.4 MG SL tablet Place 0.4 mg under the tongue every 5 (five) minutes as needed.    Yes Historical Provider, MD  Omega-3 Fatty Acids (FISH OIL) 1200 MG CAPS Take 1 capsule by mouth daily.   Yes Historical Provider, MD  tiotropium (SPIRIVA) 18 MCG inhalation capsule Place 18 mcg into inhaler and inhale every morning.    Yes Historical Provider, MD  valsartan-hydrochlorothiazide (DIOVAN-HCT) 160-12.5 MG per tablet Take 0.5 tablets by mouth daily.    Yes Historical Provider, MD  bisacodyl (DULCOLAX) 5 MG EC tablet Take 1 tablet (5 mg total) by mouth daily as needed for constipation. 03/16/12   Eulas Post, MD  enoxaparin (LOVENOX) 40 MG/0.4ML injection Inject 0.4 mLs (40 mg total) into the skin daily. 03/16/12   Eulas Post, MD  HYDROcodone-acetaminophen (NORCO) 10-325 MG per tablet Take 1-2 tablets by mouth every 6 (six) hours as needed for pain. 03/16/12   Eulas Post, MD  sennosides-docusate sodium (SENOKOT-S) 8.6-50 MG tablet Take 1 tablet by mouth daily. 03/16/12   Eulas Post, MD   Allergies  Allergen Reactions  . Codeine Hives       . Penicillins Hives  . Shellfish  Allergy Other (See Comments)    Absent reflexes    FAMILY HISTORY:  Family History  Problem Relation Age of Onset  . Coronary artery disease Father     & Mother  . Heart attack Father   . Gout Father   . Osteoarthritis Mother   . Heart disease Mother     congestive heart failure  . Hypertension Sister   . Heart disease Sister   . Cancer Sister     breast  . Breast cancer      Aunts  . Uterine cancer Sister   . Colon cancer Neg Hx  SOCIAL HISTORY:  reports that she quit smoking about 5 years ago. Her smoking use included Cigarettes. She has a 100 pack-year smoking history. She has never used smokeless tobacco. She reports that she drinks about 3.6 ounces of alcohol per week. She reports that she does not use illicit drugs.  REVIEW OF SYSTEMS:   Constitutional: Negative for fever, chills, weight loss, malaise/fatigue and diaphoresis.  HENT: Negative for hearing loss, ear pain, nosebleeds, congestion, sore throat, neck pain, tinnitus and ear discharge.   Eyes: Negative for blurred vision, double vision, photophobia, pain, discharge and redness.  Respiratory: Negative for  hemoptysis, , wheezing and stridor.    +++wheeze and prod cough of green brown mucus.  +++DOE  On oxygen Cardiovascular: Negative for chest pain, palpitations, orthopnea, claudication, leg swelling and PND.  Gastrointestinal: Negative for heartburn, nausea, vomiting, abdominal pain, diarrhea, constipation, blood in stool and melena.  Genitourinary: Negative for dysuria, urgency, frequency, hematuria and flank pain.  Musculoskeletal: Negative for myalgias, back pain, joint pain   +++frequent falls ++RLE leg fx  Skin: Negative for itching and rash.  Neurological: Negative for dizziness, tingling, tremors, sensory change, speech change, focal weakness, seizures, loss of consciousness, weakness and headaches.  Endo/Heme/Allergies: Negative for environmental allergies and polydipsia. Does not bruise/bleed  easily.  INTERVAL HISTORY:   VITAL SIGNS: Temp:  [97 F (36.1 C)-98.7 F (37.1 C)] 98.7 F (37.1 C) (11/06 0519) Pulse Rate:  [86-95] 93  (11/06 0519) Resp:  [14-24] 18  (11/06 0519) BP: (101-144)/(54-85) 117/64 mmHg (11/06 0519) SpO2:  [86 %-98 %] 93 % (11/06 0915)  PHYSICAL EXAMINATION: General:  Elderly WF in nad Neuro:  No focal deficits HEENT:  No jvd, no LN  , moist mucus membranes.  Neck supple Cardiovascular:  RRR nl s1 s2 no s3 s4  Lungs:  Distant BS, few exp wheezes Abdomen:  Soft , NT  bsa  No hsm Musculoskeletal:  RLE short leg cast Skin:  intact   Lab 03/17/12 0800 03/15/12 2355  NA 139 138  K 3.8 4.0  CL 104 101  CO2 22 27  BUN 11 19  CREATININE 0.73 0.82  GLUCOSE 170* 115*    Lab 03/17/12 0800 03/15/12 2355  HGB 11.8* 13.1  HCT 37.4 40.6  WBC 12.8* 12.4*  PLT 207 231   Dg Chest 1 View  03/16/2012  *RADIOLOGY REPORT*  Clinical Data: Left ankle fracture dislocation.  Preoperative respiratory evaluation.  History of breast cancer and endometrial cancer.  PORTABLE CHEST - 1 VIEW  Comparison: CT chest 11/21/2011, 05/14/2010, 09/20/2009.  Findings: Suboptimal inspiration with atelectasis in the lung bases.  Cardiac silhouette mildly enlarged for technique and degree of inspiration, unchanged.  Thoracic aorta atherosclerotic, unchanged.  Hilar and mediastinal contours otherwise unremarkable. Lungs otherwise clear.  Surgical clips in the left breast.  IMPRESSION: Suboptimal inspiration accounts for bibasilar atelectasis.  No acute cardiopulmonary disease otherwise.   Original Report Authenticated By: Hulan Saas, M.D.    Dg Tibia/fibula Right  03/15/2012  *RADIOLOGY REPORT*  Clinical Data: Status post fall; obvious right lower leg deformity.  RIGHT TIBIA AND FIBULA - 2 VIEW  Comparison: None.  Findings: There is a comminuted fracture involving the distal tibia, and a displaced fracture involving the distal fibula, with associated dislocation at the tibiotalar  joint.  The talus is posteriorly displaced, though it still articulates with the posterior aspect of the tibial plafond.  No additional fractures are seen.  The proximal tibia and fibula appear intact.  The knee  joint demonstrates a small joint effusion, but is otherwise grossly unremarkable.  Scattered vascular calcifications are seen.  IMPRESSION:  1.  Comminuted fracture involving the distal tibia, and displaced fracture of the distal fibula, with associated dislocation at the tibiotalar joint.  Talus posteriorly displaced, though it still articulates with the posterior aspect of the tibial plafond. 2.  Small knee joint effusion noted.   Original Report Authenticated By: Tonia Ghent, M.D.    Dg Ankle 2 Views Right  03/16/2012  *RADIOLOGY REPORT*  Clinical Data: Fracture  DG C-ARM 1-60 MIN,RIGHT ANKLE - 2 VIEW  Comparison: 03/15/2012  Findings: The patient is status post open reduction internal fixation of distal tibia, distal fibular, and posterior malleolar fractures.  Improved position and alignment.  Restoration of ankle mortise.  IMPRESSION: As above.   Original Report Authenticated By: Davonna Belling, M.D.    Dg Ankle 2 Views Right  03/16/2012  *RADIOLOGY REPORT*  Clinical Data: Status post reduction of right ankle fracture/dislocation.  RIGHT ANKLE - 2 VIEW  Comparison: None.  Findings: There has been interval reduction of the previously noted fracture/dislocation of the distal tibia and fibula.  Fracture fragments are noted in grossly anatomic alignment, with approximately 3 mm of residual posterior displacement at the tibial plafond.  The talus has been reduced; no new fractures are seen.  Evaluation of the soft tissues is suboptimal due to the overlying splint.  IMPRESSION: Interval reduction of previously noted fracture/dislocation of the distal tibia and fibula, noted in grossly anatomic alignment.  No new fractures seen.   Original Report Authenticated By: Tonia Ghent, M.D.    Dg Ankle  Complete Right  03/15/2012  *RADIOLOGY REPORT*  Clinical Data: Status post fall; right lower leg deformity.  RIGHT ANKLE - COMPLETE 3+ VIEW  Comparison: None.  Findings: There are comminuted fractures involving the distal tibia and fibula, with posterior displacement at both fracture sites, and associated dislocation of the tibiotalar joint.  The talus articulates with the posterior aspect of the tibial plafond, but there is superior and posterior displacement of this fragment.  No additional fractures are identified.  Scattered vascular calcifications are seen.  Soft tissue swelling is noted about the fracture sites.  IMPRESSION:  1.  Comminuted fractures involving the distal tibia and fibula, with posterior displacement at both fracture sites, and associated dislocation of the tibiotalar joint. 2.  Scattered vascular calcifications seen.   Original Report Authenticated By: Tonia Ghent, M.D.    Ct Head Wo Contrast  03/16/2012  *RADIOLOGY REPORT*  Clinical Data: Status post fall; concern for head injury.  CT HEAD WITHOUT CONTRAST  Technique:  Contiguous axial images were obtained from the base of the skull through the vertex without contrast.  Comparison: CT of the head performed 05/22/2011  Findings: There is no evidence of acute infarction, mass lesion, or intra- or extra-axial hemorrhage on CT.  Multiple chronic infarcts are again noted, at the right frontal parietal, left parietal and bilateral occipital regions, unchanged in appearance from the prior study.  Diffuse periventricular and subcortical white matter change likely reflects small vessel ischemic microangiopathy.  Prominence of the sulci suggests mild cortical volume loss.  Cerebellar atrophy is noted, with multiple small chronic cerebellar infarcts.  The brainstem and fourth ventricle are within normal limits.  The basal ganglia are unremarkable in appearance.  The cerebral hemispheres demonstrate grossly normal gray-white differentiation. No  mass effect or midline shift is seen.   There is no evidence of fracture; visualized osseous structures are unremarkable  in appearance.  The orbits are within normal limits.  The paranasal sinuses and mastoid air cells are well- aerated.  No significant soft tissue abnormalities are seen.  IMPRESSION:  1.  No evidence of traumatic intracranial injury or fracture. 2.  Multiple chronic bilateral infarcts again noted.  Mild cortical volume loss.   Original Report Authenticated By: Tonia Ghent, M.D.    Ct Ankle Right Wo Contrast  03/16/2012  *RADIOLOGY REPORT*  Clinical Data: Fracture.  CT OF THE RIGHT ANKLE WITH CONTRAST  Technique:  Multidetector CT imaging was performed following the standard protocol during bolus administration of intravenous contrast.  Comparison: 03/15/2012  Findings: Longitudinal fracture of the distal tibia extends from the posterior diaphyseal cortex with dominant fracture plane coronally oriented, extending to the midportion of the tibial distal articular surface with mild comminution along the medial malleolar margin of the fracture plane.  Medially the fracture extends into the anterior portion of the medial malleolus.  There is a similarly oriented oblique longitudinal coronal fracture of the distal fibula with mild comminution along the fracture plane. Both the distal tibial and distal fibular bone fracture planes are separated by 305 mm, including the distal articular surface. Clearly both fractures extend into the distal tibiofibular joint. Several small fragments are present along the distal margins of the fracture planes.  Subtalar joints appear intact.  No talar dome fracture noted. Subtalar joints intact.  Calcaneus and cuboid appear unremarkable, and no separation at the Lisfranc joint is observed.  Navicular unremarkable.  No tendon entrapment observed.  IMPRESSION:  1.  Primarily coronally oriented fractures of the distal tibia and fibula, with 3-5 mm of separation along the  dominant fracture planes and mild comminution along the dominant fracture planes which extends to the distal tibial and fibular articular surfaces.   Original Report Authenticated By: Gaylyn Rong, M.D.    Dg C-arm 1-60 Min  03/16/2012  *RADIOLOGY REPORT*  Clinical Data: Fracture  DG C-ARM 1-60 MIN,RIGHT ANKLE - 2 VIEW  Comparison: 03/15/2012  Findings: The patient is status post open reduction internal fixation of distal tibia, distal fibular, and posterior malleolar fractures.  Improved position and alignment.  Restoration of ankle mortise.  IMPRESSION: As above.   Original Report Authenticated By: Davonna Belling, M.D.     ASSESSMENT / PLAN:  COPD Gold B on spiriva/advair/oxygen at home.  Now with mild tracheobronchitis    S/P ankle distal leg fx. Plan Cont advair (dulera is hosp subst) and spiriva Start avelox for 5days for tracheobronchitis No need for steroids Cont oxygen Consult [facility] Care management for outpt f/u due to frequent falls Consider short term SNF rehab, pt will need more support in home in the future .  Dorcas Carrow Beeper  [phone]  Cell  [phone]  If no response or cell goes to voicemail, call beeper [phone]  Pulmonary and Critical Care Medicine [facility]   03/17/2012, 10:24 AM

## 2012-03-17 NOTE — Progress Notes (Signed)
Thank you to Dr Shan Levans for this referral due to patient falls with injury.  Patient evaluated for community based chronic disease management services with [facility] Care Management Program as a benefit of patient's [employer]. Patient will receive a post discharge transition of care call and will be evaluated for monthly home visits for assessments and disease process education. Spoke with patient and POA at bedside to explain [facility] Care Management services. Left contact information with daughter. Made inpatient Case Manager aware that [facility] Care Management following. Of note, [facility] Care Management services does not replace or interfere with any services that are arranged by inpatient case management or social work.  For additional questions or referrals please contact Anibal Henderson BSN RN [facility] Liaison at [phone].

## 2012-03-17 NOTE — Progress Notes (Signed)
Subjective: S/P R Ankle ORIF Did well with surgery. Pain controlled. SOB is stable. No C/O No Nausea. Mild ST.   Objective: Vital signs in last 24 hours: Temp:  [97 F (36.1 C)-98.7 F (37.1 C)] 98.7 F (37.1 C) (11/06 0519) Pulse Rate:  [81-95] 93  (11/06 0519) Resp:  [14-24] 18  (11/06 0519) BP: (101-149)/(54-85) 117/64 mmHg (11/06 0519) SpO2:  [86 %-98 %] 92 % (11/06 0519) Weight change:  Last BM Date: 03/15/12  CBG (last 3)   Basename 03/17/12 0637  GLUCAP 114*    Intake/Output from previous day:  Intake/Output Summary (Last 24 hours) at 03/17/12 0712 Last data filed at 03/17/12 0500  Gross per 24 hour  Intake 1847.5 ml  Output   2500 ml  Net -652.5 ml   11/05 0701 - 11/06 0700 In: 1847.5 [I.V.:1847.5] Out: 2500 [Urine:2450; Blood:50]   Physical Exam  General appearance: A and O.  Some short term mem issues,. Eyes: no scleral icterus Throat: oropharynx moist without erythema Resp: Distant but clear Cardio: Reg GI: soft, non-tender; bowel sounds normal; no masses,  no organomegaly Extremities: no clubbing, cyanosis or edema.  R ankle Casted. She is in mild pain.   Lab Results:  [facility] 03/15/12 2355  NA 138  K 4.0  CL 101  CO2 27  GLUCOSE 115*  BUN 19  CREATININE 0.82  CALCIUM 10.0  MG --  PHOS --    No results found for this basename: AST:2,ALT:2,ALKPHOS:2,BILITOT:2,PROT:2,ALBUMIN:2 in the last 72 hours   Basename 03/15/12 2355  WBC 12.4*  NEUTROABS 9.7*  HGB 13.1  HCT 40.6  MCV 92.5  PLT 231    No results found for this basename: INR,  PROTIME    No results found for this basename: CKTOTAL:3,CKMB:3,CKMBINDEX:3,TROPONINI:3 in the last 72 hours  No results found for this basename: TSH,T4TOTAL,FREET3,T3FREE,THYROIDAB in the last 72 hours  No results found for this basename: VITAMINB12:2,FOLATE:2,FERRITIN:2,TIBC:2,IRON:2,RETICCTPCT:2 in the last 72 hours  Micro Results: Recent Results (from the past 240 hour(s))  SURGICAL  PCR SCREEN     Status: Normal   Collection Time   03/16/12  6:52 AM      Component Value Range Status Comment   MRSA, PCR NEGATIVE  NEGATIVE Final    Staphylococcus aureus NEGATIVE  NEGATIVE Final      Studies/Results: Dg Chest 1 View  03/16/2012  *RADIOLOGY REPORT*  Clinical Data: Left ankle fracture dislocation.  Preoperative respiratory evaluation.  History of breast cancer and endometrial cancer.  PORTABLE CHEST - 1 VIEW  Comparison: CT chest 11/21/2011, 05/14/2010, 09/20/2009.  Findings: Suboptimal inspiration with atelectasis in the lung bases.  Cardiac silhouette mildly enlarged for technique and degree of inspiration, unchanged.  Thoracic aorta atherosclerotic, unchanged.  Hilar and mediastinal contours otherwise unremarkable. Lungs otherwise clear.  Surgical clips in the left breast.  IMPRESSION: Suboptimal inspiration accounts for bibasilar atelectasis.  No acute cardiopulmonary disease otherwise.   Original Report Authenticated By: Hulan Saas, M.D.    Dg Tibia/fibula Right  03/15/2012  *RADIOLOGY REPORT*  Clinical Data: Status post fall; obvious right lower leg deformity.  RIGHT TIBIA AND FIBULA - 2 VIEW  Comparison: None.  Findings: There is a comminuted fracture involving the distal tibia, and a displaced fracture involving the distal fibula, with associated dislocation at the tibiotalar joint.  The talus is posteriorly displaced, though it still articulates with the posterior aspect of the tibial plafond.  No additional fractures are seen.  The proximal tibia and fibula appear intact.  The knee  joint demonstrates a small joint effusion, but is otherwise grossly unremarkable.  Scattered vascular calcifications are seen.  IMPRESSION:  1.  Comminuted fracture involving the distal tibia, and displaced fracture of the distal fibula, with associated dislocation at the tibiotalar joint.  Talus posteriorly displaced, though it still articulates with the posterior aspect of the tibial plafond.  2.  Small knee joint effusion noted.   Original Report Authenticated By: Tonia Ghent, M.D.    Dg Ankle 2 Views Right  03/16/2012  *RADIOLOGY REPORT*  Clinical Data: Fracture  DG C-ARM 1-60 MIN,RIGHT ANKLE - 2 VIEW  Comparison: 03/15/2012  Findings: The patient is status post open reduction internal fixation of distal tibia, distal fibular, and posterior malleolar fractures.  Improved position and alignment.  Restoration of ankle mortise.  IMPRESSION: As above.   Original Report Authenticated By: Davonna Belling, M.D.    Dg Ankle 2 Views Right  03/16/2012  *RADIOLOGY REPORT*  Clinical Data: Status post reduction of right ankle fracture/dislocation.  RIGHT ANKLE - 2 VIEW  Comparison: None.  Findings: There has been interval reduction of the previously noted fracture/dislocation of the distal tibia and fibula.  Fracture fragments are noted in grossly anatomic alignment, with approximately 3 mm of residual posterior displacement at the tibial plafond.  The talus has been reduced; no new fractures are seen.  Evaluation of the soft tissues is suboptimal due to the overlying splint.  IMPRESSION: Interval reduction of previously noted fracture/dislocation of the distal tibia and fibula, noted in grossly anatomic alignment.  No new fractures seen.   Original Report Authenticated By: Tonia Ghent, M.D.    Dg Ankle Complete Right  03/15/2012  *RADIOLOGY REPORT*  Clinical Data: Status post fall; right lower leg deformity.  RIGHT ANKLE - COMPLETE 3+ VIEW  Comparison: None.  Findings: There are comminuted fractures involving the distal tibia and fibula, with posterior displacement at both fracture sites, and associated dislocation of the tibiotalar joint.  The talus articulates with the posterior aspect of the tibial plafond, but there is superior and posterior displacement of this fragment.  No additional fractures are identified.  Scattered vascular calcifications are seen.  Soft tissue swelling is noted about the  fracture sites.  IMPRESSION:  1.  Comminuted fractures involving the distal tibia and fibula, with posterior displacement at both fracture sites, and associated dislocation of the tibiotalar joint. 2.  Scattered vascular calcifications seen.   Original Report Authenticated By: Tonia Ghent, M.D.    Ct Head Wo Contrast  03/16/2012  *RADIOLOGY REPORT*  Clinical Data: Status post fall; concern for head injury.  CT HEAD WITHOUT CONTRAST  Technique:  Contiguous axial images were obtained from the base of the skull through the vertex without contrast.  Comparison: CT of the head performed 05/22/2011  Findings: There is no evidence of acute infarction, mass lesion, or intra- or extra-axial hemorrhage on CT.  Multiple chronic infarcts are again noted, at the right frontal parietal, left parietal and bilateral occipital regions, unchanged in appearance from the prior study.  Diffuse periventricular and subcortical white matter change likely reflects small vessel ischemic microangiopathy.  Prominence of the sulci suggests mild cortical volume loss.  Cerebellar atrophy is noted, with multiple small chronic cerebellar infarcts.  The brainstem and fourth ventricle are within normal limits.  The basal ganglia are unremarkable in appearance.  The cerebral hemispheres demonstrate grossly normal gray-white differentiation. No mass effect or midline shift is seen.   There is no evidence of fracture; visualized osseous structures are unremarkable  in appearance.  The orbits are within normal limits.  The paranasal sinuses and mastoid air cells are well- aerated.  No significant soft tissue abnormalities are seen.  IMPRESSION:  1.  No evidence of traumatic intracranial injury or fracture. 2.  Multiple chronic bilateral infarcts again noted.  Mild cortical volume loss.   Original Report Authenticated By: Tonia Ghent, M.D.    Ct Ankle Right Wo Contrast  03/16/2012  *RADIOLOGY REPORT*  Clinical Data: Fracture.  CT OF THE RIGHT  ANKLE WITH CONTRAST  Technique:  Multidetector CT imaging was performed following the standard protocol during bolus administration of intravenous contrast.  Comparison: 03/15/2012  Findings: Longitudinal fracture of the distal tibia extends from the posterior diaphyseal cortex with dominant fracture plane coronally oriented, extending to the midportion of the tibial distal articular surface with mild comminution along the medial malleolar margin of the fracture plane.  Medially the fracture extends into the anterior portion of the medial malleolus.  There is a similarly oriented oblique longitudinal coronal fracture of the distal fibula with mild comminution along the fracture plane. Both the distal tibial and distal fibular bone fracture planes are separated by 305 mm, including the distal articular surface. Clearly both fractures extend into the distal tibiofibular joint. Several small fragments are present along the distal margins of the fracture planes.  Subtalar joints appear intact.  No talar dome fracture noted. Subtalar joints intact.  Calcaneus and cuboid appear unremarkable, and no separation at the Lisfranc joint is observed.  Navicular unremarkable.  No tendon entrapment observed.  IMPRESSION:  1.  Primarily coronally oriented fractures of the distal tibia and fibula, with 3-5 mm of separation along the dominant fracture planes and mild comminution along the dominant fracture planes which extends to the distal tibial and fibular articular surfaces.   Original Report Authenticated By: Gaylyn Rong, M.D.    Dg C-arm 1-60 Min  03/16/2012  *RADIOLOGY REPORT*  Clinical Data: Fracture  DG C-ARM 1-60 MIN,RIGHT ANKLE - 2 VIEW  Comparison: 03/15/2012  Findings: The patient is status post open reduction internal fixation of distal tibia, distal fibular, and posterior malleolar fractures.  Improved position and alignment.  Restoration of ankle mortise.  IMPRESSION: As above.   Original Report Authenticated  By: Davonna Belling, M.D.      Medications: Scheduled:    . [COMPLETED] sodium chloride   Intravenous STAT  . aspirin  81 mg Oral Daily  . atorvastatin  40 mg Oral q1800  . calcium-vitamin D  1 tablet Oral Daily  . cilostazol  50 mg Oral BID  . docusate sodium  100 mg Oral BID  . enoxaparin (LOVENOX) injection  40 mg Subcutaneous Q24H  . irbesartan  75 mg Oral Daily   And  . hydrochlorothiazide  6.25 mg Oral Daily  . memantine  10 mg Oral BID  . metoprolol  50 mg Oral BID  . mirtazapine  30 mg Oral QHS  . mometasone-formoterol  2 puff Inhalation BID  . multivitamin with minerals  1 tablet Oral Daily  . omega-3 acid ethyl esters  1 g Oral Daily  . senna  1 tablet Oral BID  . tiotropium  18 mcg Inhalation q morning - 10a  . [COMPLETED] vancomycin (VANCOCIN) 1000 mg IVPB  1,000 mg Intravenous Once  . [COMPLETED] vancomycin  1,000 mg Intravenous Q12H  . [DISCONTINUED] aspirin  81 mg Oral Daily  . [DISCONTINUED] multivitamin  1 tablet Oral Daily  . [DISCONTINUED] valsartan-hydrochlorothiazide  0.5 tablet Oral Daily  Continuous:    . 0.9 % NaCl with KCl 20 mEq / L 75 mL/hr at 03/16/12 2144  . [DISCONTINUED] lactated ringers 50 mL/hr at 03/16/12 1610     Assessment/Plan: Principal Problem:  *Closed dislocation of right ankle, with complex fracture   R Ankle Fracture admitted by Ortho  - POD #1.  Looks much better than expected.  PT per Ortho and CW working on [employer].  COPD c Chronic Hypoxia- on Chronic FIO2 and Rxs.  Advair Elwin Sleight) /Spiriva/Ventolin. Continue 2-4 L [place].   I called Pulm CCM 03/16/12 and they will follow along and be ready for any pulm issue.  HTN - BP fine.  Syncope/NSVT last yr - Dr Ladona Ridgel.  Off Amio.  Watch on monitor.  Dementia - On meds.   Constipation - Medical Rx c miralax/Colace.  DVT Prophylaxis - Lovenox post op.  Resume ASA/Plavix as ortho feels appropriate.  DNR  Will need Rehab and AL.  FLU VACC 01/2012  PNA Vax @ 2008     LOS: 2 days     Moe Brier M 03/17/2012, 7:12 AM

## 2012-03-17 NOTE — Progress Notes (Signed)
Patient experiencing non-sustained V-Tach this am.  Patient asymptomatic, no distress noted.  Denies pain, SOB, nausea, vomiting, dizziness.  Temp 98.7-BP 117/64, HR 93, RR 18, 92% on 3L O2 [place].   Dr. Timothy Lasso aware, nursing will continue to monitor.

## 2012-03-17 NOTE — Evaluation (Signed)
Physical Therapy Evaluation Patient Details Name: Melinda Mora MRN: [id number] DOB: December 23, 1933 Today's Date: 03/17/2012 Time: 1030-1056 PT Time Calculation (min): 26 min  PT Assessment / Plan / Recommendation Clinical Impression  Pt is a 78 y/o female admitted s/p right ankle ORIF along with the below PT problem list. Pt would benefit from acute PT to maximize independence and facilitate d/c to SNF due to pt lives alone without 24 hour assistance available.    PT Assessment  Patient needs continued PT services    Follow Up Recommendations  SNF;Supervision/Assistance - 24 hour;Other (comment) (Defer Equipment needs to SNF)    Does the patient have the potential to tolerate intense rehabilitation      Barriers to Discharge Decreased caregiver support      Equipment Recommendations    Defer to SNF   Recommendations for Other Services     Frequency Min 3X/week    Precautions / Restrictions Precautions Precautions: Fall Restrictions Weight Bearing Restrictions: Yes RLE Weight Bearing: Non weight bearing   Pertinent Vitals/Pain None reported.      Mobility  Bed Mobility Bed Mobility: Supine to Sit Supine to Sit: 1: +2 Total assist;HOB flat Supine to Sit: Patient Percentage: 60% Details for Bed Mobility Assistance: Assist for right LE due to weakness and trunk to translate anterior. Cues for sequence and safety. Transfers Transfers: Sit to Stand;Stand to Sit;Stand Pivot Transfers Sit to Stand: 1: +2 Total assist;With upper extremity assist;From bed Sit to Stand: Patient Percentage: 50% Stand to Sit: 1: +2 Total assist;With upper extremity assist;To chair/3-in-1 Stand to Sit: Patient Percentage: 50% Details for Transfer Assistance: Assist to translate trunk anterior over BOS with support to off weight right side in order to maintain NWBing right LE. Cues for safest hand placement and sequence. Ambulation/Gait Ambulation/Gait Assistance: Not tested (comment) Stairs:  No Wheelchair Mobility Wheelchair Mobility: No    Shoulder Instructions     Exercises     PT Diagnosis: Difficulty walking;Generalized weakness  PT Problem List: Decreased strength;Decreased activity tolerance;Decreased balance;Decreased mobility;Decreased knowledge of use of DME;Decreased knowledge of precautions PT Treatment Interventions: DME instruction;Gait training;Functional mobility training;Therapeutic activities;Balance training;Patient/family education   PT Goals Acute Rehab PT Goals PT Goal Formulation: With patient/family Time For Goal Achievement: 03/31/12 Potential to Achieve Goals: Good Pt will go Supine/Side to Sit: with min assist PT Goal: Supine/Side to Sit - Progress: Goal set today Pt will go Sit to Supine/Side: with min assist PT Goal: Sit to Supine/Side - Progress: Goal set today Pt will go Sit to Stand: with min assist PT Goal: Sit to Stand - Progress: Goal set today Pt will go Stand to Sit: with min assist PT Goal: Stand to Sit - Progress: Goal set today Pt will Transfer Bed to Chair/Chair to Bed: with min assist PT Transfer Goal: Bed to Chair/Chair to Bed - Progress: Goal set today Pt will Ambulate: 1 - 15 feet;with min assist;with least restrictive assistive device PT Goal: Ambulate - Progress: Goal set today  Visit Information  Last PT Received On: 03/17/12 Assistance Needed: +2    Subjective Data  Subjective: "Doing alright." Patient Stated Goal: "Go somewhere first and then go home."   Prior Functioning  Home Living Lives With: Alone Available Help at Discharge: Available PRN/intermittently Type of Home: Other (Comment) (Townhouse) Home Access: Stairs to enter [employer] of Steps: 1 Entrance Stairs-Rails: None Home Layout: One level Bathroom Shower/Tub: Tub/shower unit;Curtain Firefighter: Standard Home Adaptive Equipment: Environmental consultant - four wheeled Prior Function Level of Independence:  Independent with assistive device(s)  (Use 4 wheel RW occassionally throughout the day.) Able to Take Stairs?: Yes Driving: No Vocation: Retired Musician: No difficulties Dominant Hand: Right    Cognition  Overall Cognitive Status: Appears within functional limits for tasks assessed/performed Arousal/Alertness: Awake/alert Orientation Level: Appears intact for tasks assessed Behavior During Session: [facility] for tasks performed    Extremity/Trunk Assessment Right Upper Extremity Assessment RUE ROM/Strength/Tone: Within functional levels RUE Sensation: WFL - Light Touch RUE Coordination: WFL - gross/fine motor Left Upper Extremity Assessment LUE ROM/Strength/Tone: Within functional levels LUE Sensation: WFL - Light Touch LUE Coordination: WFL - gross/fine motor Right Lower Extremity Assessment RLE ROM/Strength/Tone: Deficits;Due to pain RLE ROM/Strength/Tone Deficits: 2/5 throughout. RLE Sensation: WFL - Light Touch RLE Coordination: WFL - gross motor Left Lower Extremity Assessment LLE ROM/Strength/Tone: Within functional levels LLE Sensation: WFL - Light Touch LLE Coordination: WFL - gross motor Trunk Assessment Trunk Assessment: Normal   Balance Balance Balance Assessed: Yes Static Sitting Balance Static Sitting - Balance Support: Bilateral upper extremity supported;Feet supported Static Sitting - Level of Assistance: 5: Stand by assistance Static Sitting - Comment/# of Minutes: Pt able to sit EOB for 5 minutes with min (guard) for balance.  End of Session PT - End of Session Equipment Utilized During Treatment: Gait belt Activity Tolerance: Patient tolerated treatment well Patient left: in chair;with call bell/phone within reach;with family/visitor present Nurse Communication: Mobility status;Need for lift equipment  GP     Cephus Shelling 03/17/2012, 11:01 AM  03/17/2012 Cephus Shelling, PT, DPT [phone]

## 2012-03-18 ENCOUNTER — Ambulatory Visit: Payer: Self-pay | Admitting: Adult Health

## 2012-03-18 ENCOUNTER — Encounter (HOSPITAL_COMMUNITY)
Admission: RE | Admit: 2012-03-18 | Payer: Medicare Other | Source: Ambulatory Visit | Attending: Cardiology | Admitting: Cardiology

## 2012-03-18 DIAGNOSIS — J438 Other emphysema: Secondary | ICD-10-CM

## 2012-03-18 DIAGNOSIS — J984 Other disorders of lung: Secondary | ICD-10-CM

## 2012-03-18 LAB — BASIC METABOLIC PANEL
BUN: 16 mg/dL (ref 6–23)
CO2: 27 mEq/L (ref 19–32)
Chloride: 107 mEq/L (ref 96–112)
Creatinine, Ser: 0.85 mg/dL (ref 0.50–1.10)
Potassium: 4 mEq/L (ref 3.5–5.1)

## 2012-03-18 LAB — CBC
HCT: 34 % — ABNORMAL LOW (ref 36.0–46.0)
Hemoglobin: 10.6 g/dL — ABNORMAL LOW (ref 12.0–15.0)
MCV: 94.2 fL (ref 78.0–100.0)
RBC: 3.61 MIL/uL — ABNORMAL LOW (ref 3.87–5.11)
WBC: 9.8 10*3/uL (ref 4.0–10.5)

## 2012-03-18 MED ORDER — LEVOFLOXACIN 500 MG PO TABS: 500.0000 mg | ORAL_TABLET | Freq: Every day | ORAL | Status: DC

## 2012-03-18 MED ORDER — DSS 100 MG PO CAPS: 100.0000 mg | ORAL_CAPSULE | Freq: Two times a day (BID) | ORAL | Status: DC

## 2012-03-18 MED ORDER — BISACODYL 10 MG RE SUPP
10.0000 mg | Freq: Every day | RECTAL | Status: DC | PRN
  Filled 2012-03-18: qty 1

## 2012-03-18 MED ORDER — ALPRAZOLAM 0.25 MG PO TABS
0.2500 mg | ORAL_TABLET | Freq: Two times a day (BID) | ORAL | Status: DC | PRN
  Administered 2012-03-18 (×2): 0.25 mg via ORAL
  Filled 2012-03-18 (×2): qty 1

## 2012-03-18 MED ORDER — ALPRAZOLAM 0.25 MG PO TABS: 0.2500 mg | ORAL_TABLET | Freq: Two times a day (BID) | ORAL | Status: DC | PRN

## 2012-03-18 MED ORDER — METOPROLOL TARTRATE 100 MG PO TABS
100.0000 mg | ORAL_TABLET | Freq: Two times a day (BID) | ORAL | Status: DC
  Administered 2012-03-18 – 2012-03-19 (×2): 100 mg via ORAL
  Filled 2012-03-18 (×4): qty 1

## 2012-03-18 MED ORDER — ACETAMINOPHEN 325 MG PO TABS: 650.0000 mg | ORAL_TABLET | Freq: Four times a day (QID) | ORAL | Status: DC | PRN

## 2012-03-18 NOTE — Progress Notes (Signed)
Physical Therapy Treatment Patient Details Name: Melinda Mora MRN: [id number] DOB: [birth year] Today's Date: 03/18/2012 Time: 4098-1191 PT Time Calculation (min): 44 min  PT Assessment / Plan / Recommendation Comments on Treatment Session  Pt continues to struggle with mobility.  Pt transfered from bed to 3 in 1 and from 3 in onto chair with total assistance.     Follow Up Recommendations  SNF;Supervision/Assistance - 24 hour;Other (comment)     Equipment Recommendations  None recommended by PT    Recommendations for Other Services    Frequency Min 3X/week   Plan Discharge plan remains appropriate;Frequency needs to be updated    Precautions / Restrictions Precautions Precautions: Fall Restrictions Weight Bearing Restrictions: Yes RLE Weight Bearing: Non weight bearing   Pertinent Vitals/Pain Pt c/o 7/10 pain in abdomin RN notified.     Mobility  Bed Mobility Bed Mobility: Supine to Sit;Sitting - Scoot to Edge of Bed Supine to Sit: 1: +2 Total assist;HOB flat Supine to Sit: Patient Percentage: 30% Sitting - Scoot to Edge of Bed: 1: +1 Total assist Details for Bed Mobility Assistance: Assist for R LE, Cues for technique, Assist for hip and trunk management.  Transfers Transfers: Sit to Stand;Stand to Sit;Squat Pivot Transfers Sit to Stand: 1: +2 Total assist Sit to Stand: Patient Percentage: 30% Stand to Sit: 1: +2 Total assist Stand to Sit: Patient Percentage: 30% Squat Pivot Transfers: 1: +2 Total assist Squat Pivot Transfers: Patient Percentage: 20% Details for Transfer Assistance: Multiple (5) trials. Pt unable to establish upright standing with TDWB on R LE.  PT required total assist to initiate standing and conrtol descent .  Step by step cueing for technique to pivot to 3 i 1 and then to pivot to chair.  Ambulation/Gait Ambulation/Gait Assistance: Not tested (comment) Wheelchair Mobility Wheelchair Mobility: No    Exercises     PT Diagnosis:    PT Problem  List:   PT Treatment Interventions:     PT Goals Acute Rehab PT Goals PT Goal Formulation: With patient/family Time For Goal Achievement: 03/31/12 Potential to Achieve Goals: Good Pt will go Supine/Side to Sit: with min assist PT Goal: Supine/Side to Sit - Progress: Progressing toward goal Pt will go Sit to Supine/Side: with min assist PT Goal: Sit to Supine/Side - Progress: Progressing toward goal Pt will go Sit to Stand: with min assist PT Goal: Sit to Stand - Progress: Progressing toward goal Pt will go Stand to Sit: with min assist PT Goal: Stand to Sit - Progress: Progressing toward goal Pt will Transfer Bed to Chair/Chair to Bed: with min assist PT Transfer Goal: Bed to Chair/Chair to Bed - Progress: Progressing toward goal  Visit Information  Last PT Received On: 03/18/12 Assistance Needed: +2    Subjective Data  Subjective: My stomach hurts.    Cognition  Overall Cognitive Status: Appears within functional limits for tasks assessed/performed Arousal/Alertness: Awake/alert Orientation Level: Appears intact for tasks assessed Behavior During Session: [facility] for tasks performed    Balance  Balance Balance Assessed: No  End of Session PT - End of Session Equipment Utilized During Treatment: Gait belt Activity Tolerance: Patient limited by fatigue;Patient limited by pain Patient left: in chair;with call bell/phone within reach;with family/visitor present Nurse Communication: Mobility status;Need for lift equipment   GP     Danahi Reddish 03/18/2012, 4:24 PM Imelda Dandridge L. Anthonyjames Bargar DPT [phone]

## 2012-03-18 NOTE — Consult Note (Signed)
PULMONARY  / CRITICAL CARE MEDICINE  Name: Melinda Mora MRN: [id number] DOB: Jul 07, 1933    LOS: 3  REFERRING PROVIDER:  [name] PULM MD: [name]  CHIEF COMPLAINT:  Cough, copd post op leg fx care  BRIEF PATIENT DESCRIPTION: 78 y/o WF well known COPD Gold B oxygen dependent. FeV1 70%.  Fell in home with RLE fx.  Hx of frequent falls in home.  She is in the [facility] program Medicare program.  Pt now with dyspnea on exertion and cough productive of brown green mucus.  Pt underwent leg fx repair 11/5  PCCM asked to see pt postop.    LINES / TUBES: PIV  CULTURES: none  ANTIBIOTICS: Post op vanco 11/5>>11/6  SIGNIFICANT EVENTS:  11/5 - ORIF of RLE fx  LEVEL OF CARE:  Floor PRIMARY SERVICE:  Ortho CONSULTANTSTimothy Lasso.  PCCM CODE STATUS:  full DIET:  regular DVT Px:  LMWH GI Px:  PPI   INTERVAL HISTORY: Pt complains of constipation, abd pain.  Went to bathroom and had small BM with some relief.  VITAL SIGNS: Temp:  [97.6 F (36.4 C)-97.9 F (36.6 C)] 97.9 F (36.6 C) (11/06 2309) Pulse Rate:  [86] 86  (11/06 2309) Resp:  [20] 20  (11/06 2309) BP: (100-136)/(46-69) 136/69 mmHg (11/06 2309) SpO2:  [92 %-96 %] 96 % (11/07 0900)  PHYSICAL EXAMINATION: General:  Elderly WF in nad Neuro:  No focal deficits HEENT:  No jvd, no LN  , moist mucus membranes.  Neck supple Cardiovascular:  RRR nl s1 s2 no s3 s4  Lungs:  Distant BS, clear bilaterally  Abdomen:  Soft , NT  bsa  No hsm Musculoskeletal:  RLE short leg cast Skin:  intact   Lab 03/18/12 0615 03/17/12 0800 03/15/12 2355  NA 142 139 138  K 4.0 3.8 4.0  CL 107 104 101  CO2 27 22 27   BUN 16 11 19   CREATININE 0.85 0.73 0.82  GLUCOSE 101* 170* 115*    Lab 03/18/12 0615 03/17/12 0800 03/15/12 2355  HGB 10.6* 11.8* 13.1  HCT 34.0* 37.4 40.6  WBC 9.8 12.8* 12.4*  PLT 200 207 231   Dg Ankle 2 Views Right  03/16/2012  *RADIOLOGY REPORT*  Clinical Data: Fracture  DG C-ARM 1-60 MIN,RIGHT ANKLE - 2 VIEW  Comparison:  03/15/2012  Findings: The patient is status post open reduction internal fixation of distal tibia, distal fibular, and posterior malleolar fractures.  Improved position and alignment.  Restoration of ankle mortise.  IMPRESSION: As above.   Original Report Authenticated By: Davonna Belling, M.D.    Dg C-arm 1-60 Min  03/16/2012  *RADIOLOGY REPORT*  Clinical Data: Fracture  DG C-ARM 1-60 MIN,RIGHT ANKLE - 2 VIEW  Comparison: 03/15/2012  Findings: The patient is status post open reduction internal fixation of distal tibia, distal fibular, and posterior malleolar fractures.  Improved position and alignment.  Restoration of ankle mortise.  IMPRESSION: As above.   Original Report Authenticated By: Davonna Belling, M.D.     ASSESSMENT / PLAN:  COPD -Gold B on spiriva/advair/oxygen at home. Mild tracheobronchitis     S/P ankle distal leg fx.  Plan: Cont advair (dulera is hosp subst) and spiriva Start avelox for 5 days for tracheobronchitis No need for steroids Cont oxygen Consult [facility] Care management for outpt f/u due to frequent falls Consider short term SNF rehab, pt will need more support in home in the future . Can follow up with Dr. [name] upon discharge.  Constipation  In setting of narcotics  Plan: -Per primary SVC, on senokot, colace scheduled, PRN dulcolax   Canary Brim, NP-C  Pulmonary & Critical Care Pgr: [phone] or [phone]  03/18/2012, 11:44 AM  Patient has history of O2 dependent COPD, down to baseline, will make apt with Dr. [name] upon d/c, if remains stable by AM will likely sign off.  Patient seen and examined, agree with above note.  I dictated the care and orders written for this patient under my direction.  Koren Bound, M.D. [phone]

## 2012-03-18 NOTE — Progress Notes (Signed)
Patient ID: Melinda Mora, female   DOB: [birth year], 78 y.o.   MRN: [id number]     Subjective:  Patient reports pain as mild to moderate.  She states that she is not doing as well as she would like.  Just very tired.  Objective:   VITALS:   Filed Vitals:   03/17/12 1502 03/17/12 1510 03/17/12 1929 03/17/12 2309  BP: 100/46   136/69  Pulse: 86 86 86 86  Temp: 97.6 F (36.4 C)   97.9 F (36.6 C)  TempSrc:      Resp: 20  20 20   Weight:      SpO2: 95% 93% 93% 92%    ABD soft Sensation intact distally Dorsiflexion/Plantar flexion intact Incision: dressing C/D/I and no drainage  LABS  Results for orders placed during the hospital encounter of 03/15/12 (from the past 24 hour(s))  CBC     Status: Abnormal   Collection Time   03/18/12  6:15 AM      Component Value Range   WBC 9.8  4.0 - 10.5 K/uL   RBC 3.61 (*) 3.87 - 5.11 MIL/uL   Hemoglobin 10.6 (*) 12.0 - 15.0 g/dL   HCT 40.9 (*) 81.1 - 91.4 %   MCV 94.2  78.0 - 100.0 fL   MCH 29.4  26.0 - 34.0 pg   MCHC 31.2  30.0 - 36.0 g/dL   RDW 78.2  95.6 - 21.3 %   Platelets 200  150 - 400 K/uL  BASIC METABOLIC PANEL     Status: Abnormal   Collection Time   03/18/12  6:15 AM      Component Value Range   Sodium 142  135 - 145 mEq/L   Potassium 4.0  3.5 - 5.1 mEq/L   Chloride 107  96 - 112 mEq/L   CO2 27  19 - 32 mEq/L   Glucose, Bld 101 (*) 70 - 99 mg/dL   BUN 16  6 - 23 mg/dL   Creatinine, Ser 0.86  0.50 - 1.10 mg/dL   Calcium 8.6  8.4 - 57.8 mg/dL   GFR calc non Af Amer 65 (*) >90 mL/min   GFR calc Af Amer 76 (*) >90 mL/min  GLUCOSE, CAPILLARY     Status: Abnormal   Collection Time   03/18/12  9:27 AM      Component Value Range   Glucose-Capillary 158 (*) 70 - 99 mg/dL   Comment 1 Notify RN      Dg Ankle 2 Views Right  03/16/2012  *RADIOLOGY REPORT*  Clinical Data: Fracture  DG C-ARM 1-60 MIN,RIGHT ANKLE - 2 VIEW  Comparison: 03/15/2012  Findings: The patient is status post open reduction internal fixation of distal  tibia, distal fibular, and posterior malleolar fractures.  Improved position and alignment.  Restoration of ankle mortise.  IMPRESSION: As above.   Original Report Authenticated By: Davonna Belling, M.D.    Dg C-arm 1-60 Min  03/16/2012  *RADIOLOGY REPORT*  Clinical Data: Fracture  DG C-ARM 1-60 MIN,RIGHT ANKLE - 2 VIEW  Comparison: 03/15/2012  Findings: The patient is status post open reduction internal fixation of distal tibia, distal fibular, and posterior malleolar fractures.  Improved position and alignment.  Restoration of ankle mortise.  IMPRESSION: As above.   Original Report Authenticated By: Davonna Belling, M.D.     Assessment/Plan: 2 Days Post-Op   Principal Problem:  *Closed dislocation of right ankle, with complex fracture Active Problems:  At high risk for injury related to fall  Advance diet Up with therapy Plan for discharge tomorrow SNF placement is preferred.   Haskel Khan 03/18/2012, 9:48 AM   Teryl Lucy, MD Cell [phone] Pager [phone]

## 2012-03-18 NOTE — Progress Notes (Signed)
Dr. Clelia Croft contacted and made aware of 10 runs of vtach pt experienced.  Pt not symptomatic at this time. Will continue to monitor.

## 2012-03-18 NOTE — Progress Notes (Signed)
1620 Dr Timothy Lasso aware of 7 runs of v tach pt experienced. Pt having no other symptons was getting oob to bsc.

## 2012-03-18 NOTE — Progress Notes (Signed)
Subjective: S/P R Ankle ORIF POD #2. Now constipated and asking for Dynamite.  Uncomfortable Gas Pain controlled. SOB is stable. No C/O No Nausea. Min cough, no sputum Episodes of NSVT Noted - Asymptomatic  Objective: Vital signs in last 24 hours: Temp:  [97.6 F (36.4 C)-97.9 F (36.6 C)] 97.9 F (36.6 C) (11/06 2309) Pulse Rate:  [86] 86  (11/06 2309) Resp:  [20] 20  (11/06 2309) BP: (100-136)/(46-69) 136/69 mmHg (11/06 2309) SpO2:  [92 %-95 %] 92 % (11/06 2309) Weight change:  Last BM Date: 03/15/12  CBG (last 3)   Basename 03/17/12 0637  GLUCAP 114*    Intake/Output from previous day: No intake or output data in the 24 hours ending 03/18/12 0726     Physical Exam  General appearance: A and O.  Some short term mem issues,. Eyes: no scleral icterus Throat: oropharynx moist without erythema Resp: Distant but clear Cardio: Reg GI: soft, non-tender; bowel sounds normal; no masses,  no organomegaly Extremities: no clubbing, cyanosis or edema.  R ankle Casted. Toes moving  Lab Results:  [facility] 03/17/12 0800 03/15/12 2355  NA 139 138  K 3.8 4.0  CL 104 101  CO2 22 27  GLUCOSE 170* 115*  BUN 11 19  CREATININE 0.73 0.82  CALCIUM 8.9 10.0  MG -- --  PHOS -- --    No results found for this basename: AST:2,ALT:2,ALKPHOS:2,BILITOT:2,PROT:2,ALBUMIN:2 in the last 72 hours   Basename 03/17/12 0800 03/15/12 2355  WBC 12.8* 12.4*  NEUTROABS -- 9.7*  HGB 11.8* 13.1  HCT 37.4 40.6  MCV 94.4 92.5  PLT 207 231    No results found for this basename: INR,  PROTIME    No results found for this basename: CKTOTAL:3,CKMB:3,CKMBINDEX:3,TROPONINI:3 in the last 72 hours  No results found for this basename: TSH,T4TOTAL,FREET3,T3FREE,THYROIDAB in the last 72 hours  No results found for this basename: VITAMINB12:2,FOLATE:2,FERRITIN:2,TIBC:2,IRON:2,RETICCTPCT:2 in the last 72 hours  Micro Results: Recent Results (from the past 240 hour(s))  SURGICAL PCR SCREEN      Status: Normal   Collection Time   03/16/12  6:52 AM      Component Value Range Status Comment   MRSA, PCR NEGATIVE  NEGATIVE Final    Staphylococcus aureus NEGATIVE  NEGATIVE Final      Studies/Results: Dg Ankle 2 Views Right  03/16/2012  *RADIOLOGY REPORT*  Clinical Data: Fracture  DG C-ARM 1-60 MIN,RIGHT ANKLE - 2 VIEW  Comparison: 03/15/2012  Findings: The patient is status post open reduction internal fixation of distal tibia, distal fibular, and posterior malleolar fractures.  Improved position and alignment.  Restoration of ankle mortise.  IMPRESSION: As above.   Original Report Authenticated By: Davonna Belling, M.D.    Dg C-arm 1-60 Min  03/16/2012  *RADIOLOGY REPORT*  Clinical Data: Fracture  DG C-ARM 1-60 MIN,RIGHT ANKLE - 2 VIEW  Comparison: 03/15/2012  Findings: The patient is status post open reduction internal fixation of distal tibia, distal fibular, and posterior malleolar fractures.  Improved position and alignment.  Restoration of ankle mortise.  IMPRESSION: As above.   Original Report Authenticated By: Davonna Belling, M.D.      Medications: Scheduled:    . aspirin  81 mg Oral Daily  . atorvastatin  40 mg Oral q1800  . calcium-vitamin D  1 tablet Oral Daily  . cilostazol  50 mg Oral BID  . docusate sodium  100 mg Oral BID  . enoxaparin (LOVENOX) injection  40 mg Subcutaneous Q24H  . irbesartan  75  mg Oral Daily   And  . hydrochlorothiazide  6.25 mg Oral Daily  . levofloxacin  500 mg Oral Daily  . memantine  10 mg Oral BID  . metoprolol  50 mg Oral BID  . mirtazapine  30 mg Oral QHS  . mometasone-formoterol  2 puff Inhalation BID  . multivitamin with minerals  1 tablet Oral Daily  . omega-3 acid ethyl esters  1 g Oral Daily  . pantoprazole  40 mg Oral Q1200  . senna  1 tablet Oral BID  . tiotropium  18 mcg Inhalation q morning - 10a  . [DISCONTINUED] levofloxacin  750 mg Oral Daily  . [DISCONTINUED] moxifloxacin  400 mg Oral q1800   Continuous:    . 0.9 % NaCl  with KCl 20 mEq / L 75 mL/hr at 03/16/12 2144     Assessment/Plan: Principal Problem:  *Closed dislocation of right ankle, with complex fracture Active Problems:  At high risk for injury related to fall   R Ankle Fracture admitted by Ortho  - POD #2.  Remains much better than expected.  PT?OT/CW working towards [employer].  COPD c Chronic Hypoxia- on Chronic FIO2 and Rxs.  Advair Elwin Sleight) /Spiriva/Ventolin. Continue 2-4 L [place].  Pulm CCM saw pt yesterday and has her on Abx and has initiated [facility] referral.  HTN - BP fine.  Syncope/NSVT last yr - Dr Ladona Ridgel.  Off Amio.  Now some NSVT - NTD - I discussed this with Cards and no good med options exist.  Dementia - On meds.   Constipation - Medical Rx c miralax/Colace.  Add Suppositories.  DVT Prophylaxis - Lovenox post op.  On ASA/Plavix on hold and may resume as ortho feels appropriate.  DNR  Will need Rehab and AL.  FLU VACC 01/2012  PNA Vax @ 2008     LOS: 3 days   Melinda Mora M 03/18/2012, 7:26 AM

## 2012-03-18 NOTE — Progress Notes (Signed)
CARE MANAGEMENT NOTE 03/18/2012  Patient:  Melinda Mora, Melinda Mora   Account Number:  [id number]  Date Initiated:  03/18/2012  Documentation initiated by:  Vance Peper  Subjective/Objective Assessment:   78 yr old female s/p right ankle ORIF     Action/Plan:   Patient will go to SNF for shortterm rehab. Social Worker is aware.   Anticipated DC Date:  03/19/2012   Anticipated DC Plan:  SKILLED NURSING FACILITY  In-house referral  Clinical Social Worker      DC Planning Services  CM consult      Choice offered to / List presented to:             Status of service:  Completed, signed off Medicare Important Message given?   (If response is "NO", the following Medicare IM given date fields will be blank) Date Medicare IM given:   Date Additional Medicare IM given:    Discharge Disposition:  SKILLED NURSING FACILITY  Per UR Regulation:    If discussed at Long Length of Stay Meetings, dates discussed:    Comments:

## 2012-03-19 ENCOUNTER — Encounter (HOSPITAL_COMMUNITY): Payer: Medicare Other

## 2012-03-19 ENCOUNTER — Inpatient Hospital Stay (HOSPITAL_COMMUNITY): Payer: Medicare Other

## 2012-03-19 DIAGNOSIS — I219 Acute myocardial infarction, unspecified: Secondary | ICD-10-CM

## 2012-03-19 LAB — CBC
Hemoglobin: 11 g/dL — ABNORMAL LOW (ref 12.0–15.0)
MCH: 29.3 pg (ref 26.0–34.0)
MCV: 92.5 fL (ref 78.0–100.0)
Platelets: 233 10*3/uL (ref 150–400)
RBC: 3.75 MIL/uL — ABNORMAL LOW (ref 3.87–5.11)
WBC: 15.5 10*3/uL — ABNORMAL HIGH (ref 4.0–10.5)

## 2012-03-19 LAB — GLUCOSE, CAPILLARY: Glucose-Capillary: 121 mg/dL — ABNORMAL HIGH (ref 70–99)

## 2012-03-19 MED ORDER — ALPRAZOLAM 0.25 MG PO TABS
0.2500 mg | ORAL_TABLET | Freq: Four times a day (QID) | ORAL | Status: DC | PRN
  Administered 2012-03-19: 0.25 mg via ORAL
  Filled 2012-03-19: qty 1

## 2012-03-19 MED ORDER — HYDROCODONE-ACETAMINOPHEN 10-325 MG PO TABS: 1.0000 | ORAL_TABLET | Freq: Four times a day (QID) | ORAL | Status: DC | PRN

## 2012-03-19 NOTE — Progress Notes (Signed)
Patient ID: [name], female   DOB: 1934-04-19, 78 y.o.   MRN: [id number]     Subjective:  Patient reports pain as mild to moderate.  She states that she is feeling better and is at the side of the bed.    Objective:   VITALS:   Filed Vitals:   03/18/12 2034 03/19/12 0000 03/19/12 0400 03/19/12 0624  BP: 184/78   146/85  Pulse: 107   84  Temp: 97.9 F (36.6 C)   98.6 F (37 C)  TempSrc:      Resp: 18 16 16 18   Weight:      SpO2: 95% 95% 96% 96%    ABD soft Sensation intact distally Dorsiflexion/Plantar flexion intact Incision: dressing C/D/I and no drainage Post op splint is in good condition and no skin problems.  LABS  Results for orders placed during the hospital encounter of 03/15/12 (from the past 24 hour(s))  GLUCOSE, CAPILLARY     Status: Abnormal   Collection Time   03/18/12  9:27 AM      Component Value Range   Glucose-Capillary 158 (*) 70 - 99 mg/dL   Comment 1 Notify RN    CBC     Status: Abnormal   Collection Time   03/19/12  5:45 AM      Component Value Range   WBC 15.5 (*) 4.0 - 10.5 K/uL   RBC 3.75 (*) 3.87 - 5.11 MIL/uL   Hemoglobin 11.0 (*) 12.0 - 15.0 g/dL   HCT 01.0 (*) 27.2 - 53.6 %   MCV 92.5  78.0 - 100.0 fL   MCH 29.3  26.0 - 34.0 pg   MCHC 31.7  30.0 - 36.0 g/dL   RDW 64.4  03.4 - 74.2 %   Platelets 233  150 - 400 K/uL    No results found.  Assessment/Plan: 3 Days Post-Op   Principal Problem:  *Closed dislocation of right ankle, with complex fracture Active Problems:  At high risk for injury related to fall   Advance diet Up with therapy Discharge to SNF   [facility], [place] 03/19/2012, 7:10 AM   Teryl Lucy, MD Cell [phone] Pager [phone]

## 2012-03-19 NOTE — Progress Notes (Signed)
Subjective: S/P R Ankle ORIF POD #3. Complains of  Bloating and constipation although she has had many BMs. Pain controlled. SOB is stable. She is fearful and very anxious. Min cough, no sputum Episodes of NSVT Noted - Asymptomatic.  BB adjusted  Objective: Vital signs in last 24 hours: Temp:  [97.9 F (36.6 C)-98.6 F (37 C)] 98.6 F (37 C) (11/08 0624) Pulse Rate:  [79-107] 84  (11/08 0624) Resp:  [16-20] 18  (11/08 0624) BP: (116-184)/(55-85) 146/85 mmHg (11/08 0624) SpO2:  [93 %-96 %] 96 % (11/08 0624) Weight change:  Last BM Date: 03/18/12  CBG (last 3)   Basename 03/19/12 0721 03/18/12 0927 03/17/12 0637  GLUCAP 121* 158* 114*    Intake/Output from previous day:  Intake/Output Summary (Last 24 hours) at 03/19/12 0727 Last data filed at 03/18/12 2300  Gross per 24 hour  Intake    720 ml  Output    200 ml  Net    520 ml   11/07 0701 - 11/08 0700 In: 720 [P.O.:720] Out: 200 [Urine:200]   Physical Exam  General appearance: A and O.  Some short term mem issues. Throat: oropharynx moist without erythema Resp: Distant but clear Cardio: Reg GI: Bloated, soft, non-tender; bowel sounds normal; no masses,  no organomegaly Extremities: no clubbing, cyanosis or edema.  R ankle Casted. Toes moving  Lab Results:  Basename 03/18/12 0615 03/17/12 0800  NA 142 139  K 4.0 3.8  CL 107 104  CO2 27 22  GLUCOSE 101* 170*  BUN 16 11  CREATININE 0.85 0.73  CALCIUM 8.6 8.9  MG -- --  PHOS -- --    No results found for this basename: AST:2,ALT:2,ALKPHOS:2,BILITOT:2,PROT:2,ALBUMIN:2 in the last 72 hours   Basename 03/19/12 0545 03/18/12 0615  WBC 15.5* 9.8  NEUTROABS -- --  HGB 11.0* 10.6*  HCT 34.7* 34.0*  MCV 92.5 94.2  PLT 233 200    No results found for this basename: INR,  PROTIME    No results found for this basename: CKTOTAL:3,CKMB:3,CKMBINDEX:3,TROPONINI:3 in the last 72 hours  No results found for this basename: TSH,T4TOTAL,FREET3,T3FREE,THYROIDAB  in the last 72 hours  No results found for this basename: VITAMINB12:2,FOLATE:2,FERRITIN:2,TIBC:2,IRON:2,RETICCTPCT:2 in the last 72 hours  Micro Results: Recent Results (from the past 240 hour(s))  SURGICAL PCR SCREEN     Status: Normal   Collection Time   03/16/12  6:52 AM      Component Value Range Status Comment   MRSA, PCR NEGATIVE  NEGATIVE Final    Staphylococcus aureus NEGATIVE  NEGATIVE Final      Studies/Results: No results found.   Medications: Scheduled:    . aspirin  81 mg Oral Daily  . atorvastatin  40 mg Oral q1800  . calcium-vitamin D  1 tablet Oral Daily  . cilostazol  50 mg Oral BID  . docusate sodium  100 mg Oral BID  . enoxaparin (LOVENOX) injection  40 mg Subcutaneous Q24H  . irbesartan  75 mg Oral Daily   And  . hydrochlorothiazide  6.25 mg Oral Daily  . levofloxacin  500 mg Oral Daily  . memantine  10 mg Oral BID  . metoprolol  100 mg Oral BID  . mirtazapine  30 mg Oral QHS  . mometasone-formoterol  2 puff Inhalation BID  . multivitamin with minerals  1 tablet Oral Daily  . omega-3 acid ethyl esters  1 g Oral Daily  . pantoprazole  40 mg Oral Q1200  . senna  1 tablet  Oral BID  . tiotropium  18 mcg Inhalation q morning - 10a  . [DISCONTINUED] metoprolol  50 mg Oral BID   Continuous:    . [DISCONTINUED] 0.9 % NaCl with KCl 20 mEq / L 75 mL/hr at 03/16/12 2144     Assessment/Plan: Principal Problem:  *Closed dislocation of right ankle, with complex fracture Active Problems:  At high risk for injury related to fall   R Ankle Fracture admitted by Ortho  - POD #3.  Remains much better than expected.  PT/OT/CW working towards [employer]?  COPD c Chronic Hypoxia- on Chronic FIO2 and Rxs.  Advair Elwin Sleight) /Spiriva/Ventolin. Continue 2-4 L [place].  Pulm CCM seeing pt as well.  Abx and has initiated [facility] referral. Cont advair (dulera is hosp subst) and spiriva  Start avelox day 3 of 5 days for tracheobronchitis  No need for steroids  Cont  oxygen  [facility] consulted for Care management for outpt f/u due to frequent falls  Pt will need more support in home in the future .  Can follow up with Dr. [name] upon discharge.  HTN - BP fine.  Syncope/NSVT last yr - Dr Ladona Ridgel.  Off Amio.  Now some NSVT - BB adjusted last night - and watch Breathing- I discussed this with Cards earlier in admission.  Stop Tele.  Dementia - On meds.   Constipation - Medical Rx c miralax/Colace.  Suppositories given yesterday with good success.  Check Portable KUB to see if any partial ileus??  DVT Prophylaxis - Lovenox post op.  On ASA - Plavix on hold and may resume as ortho feels appropriate.  DNR  Will need Rehab and AL.  Significant Anxieties and fear on her face - Increase Xanax and attempt reassurance.  Verify no major issues.  If KUB is fine and she calms down I will be OK for D/C today.  Her Leukocytosis needs to be followed up on.  She has no fever.   I signed her FL-2 She needs an OOF DNR. Any ?s call my cell at [phone]   FLU [facility] 01/2012  PNA Vax @ 2008     LOS: 4 days   Thanos Cousineau M 03/19/2012, 7:27 AM

## 2012-03-19 NOTE — Progress Notes (Signed)
Physical Therapy Treatment Patient Details Name: Melinda Mora MRN: [id number] DOB: 11-18-1933 Today's Date: 03/19/2012 Time: 1478-2956 PT Time Calculation (min): 28 min  PT Assessment / Plan / Recommendation Comments on Treatment Session  Pt admitted s/p right ankle ORIF and continues to participate with therapy. Pt very limited by fatigue and strength this am. Currently requiring assist of 2 for safe transfers due to NWBing right LE.    Follow Up Recommendations  SNF;Supervision/Assistance - 24 hour     Does the patient have the potential to tolerate intense rehabilitation     Barriers to Discharge        Equipment Recommendations  None recommended by PT    Recommendations for Other Services    Frequency Min 3X/week   Plan Discharge plan remains appropriate;Frequency needs to be updated    Precautions / Restrictions Precautions Precautions: Fall Restrictions Weight Bearing Restrictions: Yes RLE Weight Bearing: Non weight bearing   Pertinent Vitals/Pain None reported.    Mobility  Bed Mobility Bed Mobility: Supine to Sit Supine to Sit: 2: Max assist Details for Bed Mobility Assistance: Assist for right LE to translate to EOB as well as for trunk to shift anterior over BOS. Increased posterior lean today with increased assist to correct. Max cues for sequence. Transfers Transfers: Sit to Stand;Stand to Sit;Squat Pivot Transfers (3 trials for sit to/from stand.) Sit to Stand: 1: +1 Total assist;From bed Stand to Sit: 1: +1 Total assist;To bed;To chair/3-in-1 Squat Pivot Transfers: 1: +2 Total assist Squat Pivot Transfers: Patient Percentage: 20% Details for Transfer Assistance: Assist to translate trunk anterior over BOS with blocking to left LE to prevent buckling/sliding. Pt with decreased independence utilizing left LE to assist with stance today. Assist also to pelvis to rotate to chair due to decreased initiation today. Max cues for  sequence. Ambulation/Gait Ambulation/Gait Assistance: Not tested (comment) Stairs: No Wheelchair Mobility Wheelchair Mobility: No    Exercises     PT Diagnosis:    PT Problem List:   PT Treatment Interventions:     PT Goals Acute Rehab PT Goals PT Goal Formulation: With patient/family Time For Goal Achievement: 03/31/12 Potential to Achieve Goals: Good PT Goal: Supine/Side to Sit - Progress: Progressing toward goal PT Goal: Sit to Stand - Progress: Progressing toward goal PT Goal: Stand to Sit - Progress: Progressing toward goal PT Transfer Goal: Bed to Chair/Chair to Bed - Progress: Progressing toward goal  Visit Information  Last PT Received On: 03/19/12 Assistance Needed: +2    Subjective Data  Subjective: "I will try my hardest." Patient Stated Goal: "Go somewhere first and then go home."   Cognition  Overall Cognitive Status: Impaired Area of Impairment: Memory;Following commands;Problem solving Arousal/Alertness: Awake/alert Orientation Level: Appears intact for tasks assessed Behavior During Session: [facility] for tasks performed Memory Deficits: Difficulty recalling PLOF today. Following Commands: Follows one step commands inconsistently;Follows one step commands with increased time Problem Solving: Difficulty with problem solving.    Balance  Balance Balance Assessed: Yes Static Sitting Balance Static Sitting - Balance Support: Bilateral upper extremity supported;Feet supported Static Sitting - Level of Assistance: 2: Max assist Static Sitting - Comment/# of Minutes: Pt sat EOB for 10 minutes with up to max assist due to inital posterior lean. Assist to correct lean and maintain midline trunk. Pt able to progress to min (guard) with time.  End of Session PT - End of Session Equipment Utilized During Treatment: Gait belt Activity Tolerance: Patient tolerated treatment well;Patient limited by fatigue Patient  left: in chair;with call bell/phone within reach Nurse  Communication: Mobility status;Need for lift equipment   GP     Cephus Shelling 03/19/2012, 12:07 PM  03/19/2012 Cephus Shelling, PT, DPT [phone]

## 2012-03-19 NOTE — Clinical Social Work Psychosocial (Addendum)
    Clinical Social Work Department BRIEF PSYCHOSOCIAL ASSESSMENT 03/19/2012  Patient:  Melinda Mora, Melinda Mora     Account Number:  [id number]     Admit date:  03/15/2012  Clinical Social Worker:  Tiburcio Pea  Date/Time:  03/17/2012 06:00 PM  Referred by:  Physician  Date Referred:  03/16/2012 Referred for  SNF Placement   Other Referral:   Interview type:  Other - See comment Other interview type:   Patient and daughter    PSYCHOSOCIAL DATA Living Status:  ALONE Admitted from facility:   Level of care:   Primary support name:   Primary support relationship to patient:  CHILD, ADULT Degree of support available:   Strong support    CURRENT CONCERNS Current Concerns  Post-Acute Placement   Other Concerns:    SOCIAL WORK ASSESSMENT / PLAN Patient referred for short term SNF.  Met wit pt's daughter as she was asleep to discuss short term SNF plan.  Daughter currently does not have a preference and requests bed search in [facility]. SNF list provided. FL2 to be placed on chart for MD's signature.  Extensive support provided to daughter regarding placment. A later visit with patient confirmed that she is agreeable to short term SNF.   Assessment/plan status:  Psychosocial Support/Ongoing Assessment of Needs Other assessment/ plan:   Information/referral to community resources:   SNF bed list provided    PATIENT'S/FAMILY'S RESPONSE TO PLAN OF CARE: Patient and daughter are agreeable to short term SNF. Daughter is concerned that pateint many not be able to return home at d/c and that ALF placmement may be indicated. This information will be relayed to SNF's who are considering patient.

## 2012-03-19 NOTE — Progress Notes (Signed)
PULMONARY  / CRITICAL CARE MEDICINE  Name: Melinda Mora MRN: [id number] DOB: Jun 04, 1933    LOS: 4  REFERRING PROVIDER:  [name] PULM MD: [name]  CHIEF COMPLAINT:  Cough, copd post op leg fx care  BRIEF PATIENT DESCRIPTION: 78 y/o WF well known COPD Gold B oxygen dependent. FeV1 70%.  Fell in home with RLE fx.  Hx of frequent falls in home.  She is in the [facility] ACO program Medicare program.  Pt now with dyspnea on exertion and cough productive of brown green mucus.  Pt underwent leg fx repair 11/5  PCCM asked to see pt postop.    LINES / TUBES: PIV  CULTURES: none  ANTIBIOTICS: Post op vanco 11/5>>11/6  SIGNIFICANT EVENTS:  11/5 - ORIF of RLE fx  LEVEL OF CARE:  Floor PRIMARY SERVICE:  Ortho CONSULTANTSTimothy Lasso.  PCCM CODE STATUS:  full DIET:  regular DVT Px:  LMWH GI Px:  PPI   INTERVAL HISTORY:  Reports she went to bathroom yesterday.  No acute events.  Short of breath with activity.    VITAL SIGNS: Temp:  [97.9 F (36.6 C)-98.6 F (37 C)] 98.6 F (37 C) (11/08 0624) Pulse Rate:  [79-107] 84  (11/08 0624) Resp:  [16-20] 18  (11/08 0624) BP: (116-184)/(55-85) 146/85 mmHg (11/08 0624) SpO2:  [93 %-96 %] 95 % (11/08 0938)  PHYSICAL EXAMINATION: General:  Elderly WF in nad Neuro:  No focal deficits HEENT:  No jvd, no LN  , moist mucus membranes.  Neck supple Cardiovascular:  RRR nl s1 s2 no s3 s4  Lungs:  Distant BS, faint crackles lower posterior Abdomen:  Soft , NT  bsa  No hsm Musculoskeletal:  RLE short leg cast Skin:  intact   Lab 03/18/12 0615 03/17/12 0800 03/15/12 2355  NA 142 139 138  K 4.0 3.8 4.0  CL 107 104 101  CO2 27 22 27   BUN 16 11 19   CREATININE 0.85 0.73 0.82  GLUCOSE 101* 170* 115*    Lab 03/19/12 0545 03/18/12 0615 03/17/12 0800  HGB 11.0* 10.6* 11.8*  HCT 34.7* 34.0* 37.4  WBC 15.5* 9.8 12.8*  PLT 233 200 207   Dg Abd 1 View  03/19/2012  *RADIOLOGY REPORT*  Clinical Data: Blunted, abdominal distension.  ABDOMEN - 1 VIEW   Comparison: 11/21/2011 CT  Findings: Nonspecific gaseous distension of loops of large and small bowel in a nonobstructive pattern.  IUD projects over the pelvis.  Atherosclerotic vascular calcifications.  Lumbar degenerative changes and mild rightward curvature.  IMPRESSION: Nonspecific gaseous distension of loops of large and small bowel in a nonobstructive pattern.   Original Report Authenticated By: Jearld Lesch, M.D.     ASSESSMENT / PLAN:  COPD -Gold B on spiriva/advair/oxygen at home. Mild tracheobronchitis     S/P ankle distal leg fx.  Plan: Cont advair (dulera is hosp subst) and spiriva Avelox for 5 days for tracheobronchitis No need for steroids Cont oxygen Consult [facility] management for outpt f/u due to frequent falls Consider short term SNF rehab, pt will need more support in home in the future . Follow up with Dr. [name] - appt arranged.    Constipation In setting of narcotics  Plan: -Per primary SVC, on senokot, colace scheduled, PRN dulcolax   PCCM will be available PRN.  Please call if we can be of further assistance. Thank you for the consult.   Canary Brim, NP-C [facility] Pgr: [phone] or [phone]  03/19/2012, 11:45 AM  Patient to be d/c to SNF, continue O2, F/U as above.  PCCM will be available PRN.  Patient seen and examined, agree with above note.  I dictated the care and orders written for this patient under my direction.  Koren Bound, M.D. [phone]

## 2012-03-19 NOTE — Discharge Summary (Signed)
Physician Discharge Summary  Patient ID: Melinda Mora MRN: [id number] DOB/AGE: 1933/10/03 78 y.o.  Admit date: 03/15/2012 Discharge date: 03/19/2012  Admission Diagnoses:  Closed dislocation of right ankle  Discharge Diagnoses:  Principal Problem:  *Closed dislocation of right ankle, with complex fracture Active Problems:  At high risk for injury related to fall   Past Medical History  Diagnosis Date  . Hyperlipidemia   . Hypertension   . Depression   . Osteoarthritis   . Allergic rhinitis   . Raynaud disease   . Adenomatous polyp 03/2004  . Diverticulosis   . Syncope Jan 2013    NSVT noted on event monitor  . COPD (chronic obstructive pulmonary disease) with emphysema   . Pulmonary nodule, right x2  middle and lower lobes--  stable per follow-ip ct  jan 2012  . S/P primary angioplasty with coronary stent   . History of acute inferior wall myocardial infarction SEPT 2008      S/P STENTING PROXIMAL TO MID RCA  . Endometrial hyperplasia   . History of breast cancer 2011--  ONCOLOGIST- DR Camelia Eng    S/P LEFT PARTIAL MASTECTOMY AND RADIATION---  NO RECURRENCE  . Dyspnea   . Anxiety   . CAD (coronary artery disease) CARDIOLOGIST-- DR Lewayne Bunting--- LAST VISIT 08-16-2011  NOTE IN EPIC    Remote inferior MI in 2008. Extensive stenting of the RCA, s/p repeat stenting of the RCA in Dec 2010 due to restenosis  . Dry eyes, bilateral   . Heart attack   . Cancer     breast  . Carotid stenosis, asymptomatic BILATERAL ICA --  0-39%  PER  DUPLEX 05-22-2011  . Clotting disorder   . Closed dislocation of right ankle, with complex fracture 03/16/2012    Surgeries: Procedure(s): OPEN REDUCTION INTERNAL FIXATION (ORIF) ANKLE FRACTURE on 03/15/2012 - 03/16/2012   Consultants (if any): Treatment Team:  Gwen Pounds, MD  Discharged Condition: Improved  Hospital Course: Melinda Mora is an 78 y.o. female who was admitted 03/15/2012 with a diagnosis of Closed dislocation of right  ankle and went to the operating room on 03/15/2012 - 03/16/2012 and underwent the above named procedures.    She was given perioperative antibiotics:  Anti-infectives     Start     Dose/Rate Route Frequency Ordered Stop   03/18/12 0000   levofloxacin (LEVAQUIN) 500 MG tablet        500 mg Oral Daily 03/18/12 1203     03/17/12 1800   moxifloxacin (AVELOX) tablet 400 mg  Status:  Discontinued        400 mg Oral Daily-1800 03/17/12 1024 03/17/12 1027   03/17/12 1100   levofloxacin (LEVAQUIN) tablet 750 mg  Status:  Discontinued        750 mg Oral Daily 03/17/12 1027 03/17/12 1035   03/17/12 1100   levofloxacin (LEVAQUIN) tablet 500 mg        500 mg Oral Daily 03/17/12 1035     03/16/12 1600   vancomycin (VANCOCIN) IVPB 1000 mg/200 mL premix        1,000 mg 200 mL/hr over 60 Minutes Intravenous Every 12 hours 03/16/12 1514 03/16/12 1940   03/16/12 1030   vancomycin (VANCOCIN) 1,000 mg in sodium chloride 0.9 % 250 mL IVPB        1,000 mg 250 mL/hr over 60 Minutes Intravenous  Once 03/16/12 1017 03/16/12 1037        .  She was given sequential compression devices, early  ambulation, and lovenox for DVT prophylaxis.  She benefited maximally from the hospital stay and there were no complications.    Recent vital signs:  Filed Vitals:   03/19/12 0624  BP: 146/85  Pulse: 84  Temp: 98.6 F (37 C)  Resp: 18    Recent laboratory studies:  Lab Results  Component Value Date   HGB 11.0* 03/19/2012   HGB 10.6* 03/18/2012   HGB 11.8* 03/17/2012   Lab Results  Component Value Date   WBC 15.5* 03/19/2012   PLT 233 03/19/2012   No results found for this basename: INR   Lab Results  Component Value Date   NA 142 03/18/2012   K 4.0 03/18/2012   CL 107 03/18/2012   CO2 27 03/18/2012   BUN 16 03/18/2012   CREATININE 0.85 03/18/2012   GLUCOSE 101* 03/18/2012    Discharge Medications:     Medication List     As of 03/19/2012  7:36 AM    STOP taking these medications          clopidogrel 75 MG tablet   Commonly known as: PLAVIX      FOSAMAX 70 MG tablet   Generic drug: alendronate      TAKE these medications         acetaminophen 325 MG tablet   Commonly known as: TYLENOL   Take 2 tablets (650 mg total) by mouth every 6 (six) hours as needed (or Fever >/= 101).      ALPRAZolam 0.25 MG tablet   Commonly known as: XANAX   Take 1 tablet (0.25 mg total) by mouth 2 (two) times daily as needed for anxiety.      aspirin 81 MG tablet   Take 81 mg by mouth daily.      atorvastatin 40 MG tablet   Commonly known as: LIPITOR   Take 1 tablet (40 mg total) by mouth daily.      bisacodyl 5 MG EC tablet   Commonly known as: DULCOLAX   Take 1 tablet (5 mg total) by mouth daily as needed for constipation.      calcium-vitamin D 500-200 MG-UNIT per tablet   Commonly known as: OSCAL WITH D   Take 1 tablet by mouth daily.      cilostazol 50 MG tablet   Commonly known as: PLETAL   Take 50 mg by mouth 2 (two) times daily.      DSS 100 MG Caps   Take 100 mg by mouth 2 (two) times daily.      enoxaparin 40 MG/0.4ML injection   Commonly known as: LOVENOX   Inject 0.4 mLs (40 mg total) into the skin daily.      Fish Oil 1200 MG Caps   Take 1 capsule by mouth daily.      Fluticasone-Salmeterol 250-50 MCG/DOSE Aepb   Commonly known as: ADVAIR   Inhale 1 puff into the lungs every 12 (twelve) hours.      HYDROcodone-acetaminophen 10-325 MG per tablet   Commonly known as: NORCO   Take 1-2 tablets by mouth every 6 (six) hours as needed for pain.      levofloxacin 500 MG tablet   Commonly known as: LEVAQUIN   Take 1 tablet (500 mg total) by mouth daily.      metoprolol 50 MG tablet   Commonly known as: LOPRESSOR   Take 1 tablet (50 mg total) by mouth 2 (two) times daily.      mirtazapine 30 MG tablet  Commonly known as: REMERON   Take 30 mg by mouth at bedtime.      multivitamin tablet   Take 1 tablet by mouth daily.      NAMENDA 5 MG tablet   Generic  drug: memantine   Take 10 mg by mouth 2 (two) times daily.      nitroGLYCERIN 0.4 MG SL tablet   Commonly known as: NITROSTAT   Place 0.4 mg under the tongue every 5 (five) minutes as needed.      sennosides-docusate sodium 8.6-50 MG tablet   Commonly known as: SENOKOT-S   Take 1 tablet by mouth daily.      tiotropium 18 MCG inhalation capsule   Commonly known as: SPIRIVA   Place 18 mcg into inhaler and inhale every morning.      valsartan-hydrochlorothiazide 160-12.5 MG per tablet   Commonly known as: DIOVAN-HCT   Take 0.5 tablets by mouth daily.      VENTOLIN HFA 108 (90 BASE) MCG/ACT inhaler   Generic drug: albuterol   Inhale 2 puffs into the lungs every 6 (six) hours as needed.        Diagnostic Studies: Dg Chest 1 View  03/16/2012  *RADIOLOGY REPORT*  Clinical Data: Left ankle fracture dislocation.  Preoperative respiratory evaluation.  History of breast cancer and endometrial cancer.  PORTABLE CHEST - 1 VIEW  Comparison: CT chest 11/21/2011, 05/14/2010, 09/20/2009.  Findings: Suboptimal inspiration with atelectasis in the lung bases.  Cardiac silhouette mildly enlarged for technique and degree of inspiration, unchanged.  Thoracic aorta atherosclerotic, unchanged.  Hilar and mediastinal contours otherwise unremarkable. Lungs otherwise clear.  Surgical clips in the left breast.  IMPRESSION: Suboptimal inspiration accounts for bibasilar atelectasis.  No acute cardiopulmonary disease otherwise.   Original Report Authenticated By: Hulan Saas, M.D.    Dg Tibia/fibula Right  03/15/2012  *RADIOLOGY REPORT*  Clinical Data: Status post fall; obvious right lower leg deformity.  RIGHT TIBIA AND FIBULA - 2 VIEW  Comparison: None.  Findings: There is a comminuted fracture involving the distal tibia, and a displaced fracture involving the distal fibula, with associated dislocation at the tibiotalar joint.  The talus is posteriorly displaced, though it still articulates with the posterior  aspect of the tibial plafond.  No additional fractures are seen.  The proximal tibia and fibula appear intact.  The knee joint demonstrates a small joint effusion, but is otherwise grossly unremarkable.  Scattered vascular calcifications are seen.  IMPRESSION:  1.  Comminuted fracture involving the distal tibia, and displaced fracture of the distal fibula, with associated dislocation at the tibiotalar joint.  Talus posteriorly displaced, though it still articulates with the posterior aspect of the tibial plafond. 2.  Small knee joint effusion noted.   Original Report Authenticated By: Tonia Ghent, M.D.    Dg Ankle 2 Views Right  03/16/2012  *RADIOLOGY REPORT*  Clinical Data: Fracture  DG C-ARM 1-60 MIN,RIGHT ANKLE - 2 VIEW  Comparison: 03/15/2012  Findings: The patient is status post open reduction internal fixation of distal tibia, distal fibular, and posterior malleolar fractures.  Improved position and alignment.  Restoration of ankle mortise.  IMPRESSION: As above.   Original Report Authenticated By: Davonna Belling, M.D.    Dg Ankle 2 Views Right  03/16/2012  *RADIOLOGY REPORT*  Clinical Data: Status post reduction of right ankle fracture/dislocation.  RIGHT ANKLE - 2 VIEW  Comparison: None.  Findings: There has been interval reduction of the previously noted fracture/dislocation of the distal tibia and fibula.  Fracture  fragments are noted in grossly anatomic alignment, with approximately 3 mm of residual posterior displacement at the tibial plafond.  The talus has been reduced; no new fractures are seen.  Evaluation of the soft tissues is suboptimal due to the overlying splint.  IMPRESSION: Interval reduction of previously noted fracture/dislocation of the distal tibia and fibula, noted in grossly anatomic alignment.  No new fractures seen.   Original Report Authenticated By: Tonia Ghent, M.D.    Dg Ankle Complete Right  03/15/2012  *RADIOLOGY REPORT*  Clinical Data: Status post fall; right lower leg  deformity.  RIGHT ANKLE - COMPLETE 3+ VIEW  Comparison: None.  Findings: There are comminuted fractures involving the distal tibia and fibula, with posterior displacement at both fracture sites, and associated dislocation of the tibiotalar joint.  The talus articulates with the posterior aspect of the tibial plafond, but there is superior and posterior displacement of this fragment.  No additional fractures are identified.  Scattered vascular calcifications are seen.  Soft tissue swelling is noted about the fracture sites.  IMPRESSION:  1.  Comminuted fractures involving the distal tibia and fibula, with posterior displacement at both fracture sites, and associated dislocation of the tibiotalar joint. 2.  Scattered vascular calcifications seen.   Original Report Authenticated By: Tonia Ghent, M.D.    Ct Head Wo Contrast  03/16/2012  *RADIOLOGY REPORT*  Clinical Data: Status post fall; concern for head injury.  CT HEAD WITHOUT CONTRAST  Technique:  Contiguous axial images were obtained from the base of the skull through the vertex without contrast.  Comparison: CT of the head performed 05/22/2011  Findings: There is no evidence of acute infarction, mass lesion, or intra- or extra-axial hemorrhage on CT.  Multiple chronic infarcts are again noted, at the right frontal parietal, left parietal and bilateral occipital regions, unchanged in appearance from the prior study.  Diffuse periventricular and subcortical white matter change likely reflects small vessel ischemic microangiopathy.  Prominence of the sulci suggests mild cortical volume loss.  Cerebellar atrophy is noted, with multiple small chronic cerebellar infarcts.  The brainstem and fourth ventricle are within normal limits.  The basal ganglia are unremarkable in appearance.  The cerebral hemispheres demonstrate grossly normal gray-white differentiation. No mass effect or midline shift is seen.   There is no evidence of fracture; visualized osseous  structures are unremarkable in appearance.  The orbits are within normal limits.  The paranasal sinuses and mastoid air cells are well- aerated.  No significant soft tissue abnormalities are seen.  IMPRESSION:  1.  No evidence of traumatic intracranial injury or fracture. 2.  Multiple chronic bilateral infarcts again noted.  Mild cortical volume loss.   Original Report Authenticated By: Tonia Ghent, M.D.    Ct Ankle Right Wo Contrast  03/16/2012  *RADIOLOGY REPORT*  Clinical Data: Fracture.  CT OF THE RIGHT ANKLE WITH CONTRAST  Technique:  Multidetector CT imaging was performed following the standard protocol during bolus administration of intravenous contrast.  Comparison: 03/15/2012  Findings: Longitudinal fracture of the distal tibia extends from the posterior diaphyseal cortex with dominant fracture plane coronally oriented, extending to the midportion of the tibial distal articular surface with mild comminution along the medial malleolar margin of the fracture plane.  Medially the fracture extends into the anterior portion of the medial malleolus.  There is a similarly oriented oblique longitudinal coronal fracture of the distal fibula with mild comminution along the fracture plane. Both the distal tibial and distal fibular bone fracture planes are separated by  305 mm, including the distal articular surface. Clearly both fractures extend into the distal tibiofibular joint. Several small fragments are present along the distal margins of the fracture planes.  Subtalar joints appear intact.  No talar dome fracture noted. Subtalar joints intact.  Calcaneus and cuboid appear unremarkable, and no separation at the Lisfranc joint is observed.  Navicular unremarkable.  No tendon entrapment observed.  IMPRESSION:  1.  Primarily coronally oriented fractures of the distal tibia and fibula, with 3-5 mm of separation along the dominant fracture planes and mild comminution along the dominant fracture planes which  extends to the distal tibial and fibular articular surfaces.   Original Report Authenticated By: Gaylyn Rong, M.D.    Dg C-arm 1-60 Min  03/16/2012  *RADIOLOGY REPORT*  Clinical Data: Fracture  DG C-ARM 1-60 MIN,RIGHT ANKLE - 2 VIEW  Comparison: 03/15/2012  Findings: The patient is status post open reduction internal fixation of distal tibia, distal fibular, and posterior malleolar fractures.  Improved position and alignment.  Restoration of ankle mortise.  IMPRESSION: As above.   Original Report Authenticated By: Davonna Belling, M.D.     Disposition: 01-Home or Self Care      Discharge Orders    Future Appointments: Provider: Department: Dept Phone: Center:   03/23/2012 1:30 PM Windell Hummingbird [facility] MEDICAL ONCOLOGY [phone] None   03/23/2012 1:45 PM Amy Allegra Grana, PA [facility] MEDICAL ONCOLOGY [phone] None     Future Orders Please Complete By Expires   Diet general      Non weight bearing      Scheduling Instructions:   Right lower extremity   Call MD / Call 911      Comments:   If you experience chest pain or shortness of breath, CALL 911 and be transported to the hospital emergency room.  If you develope a fever above 101 F, pus (white drainage) or increased drainage or redness at the wound, or calf pain, call your surgeon's office.   Discharge instructions      Comments:   Change dressing in 3 days and reapply fresh dressing, unless you have a splint (half cast).  If you have a splint/cast, just leave in place until your follow-up appointment.    Keep wounds dry for 3 weeks.  Leave steri-strips in place on skin.  Do not apply lotion or anything to the wound.   Constipation Prevention      Comments:   Drink plenty of fluids.  Prune juice may be helpful.  You may use a stool softener, such as Colace (over the counter) 100 mg twice a day.  Use MiraLax (over the counter) for constipation as needed.   Non weight bearing          Follow-up Information    Follow up with Eulas Post, MD. Schedule an appointment as soon as possible for a visit in 2 weeks.   Contact information:   [address] [place] [address] [phone]           Signed: Eulas Post 03/19/2012, 7:36 AM

## 2012-03-19 NOTE — Clinical Social Work Placement (Signed)
     Clinical Social Work Department CLINICAL SOCIAL WORK PLACEMENT NOTE 03/19/2012  Patient:  Melinda Mora, Melinda Mora  Account Number:  [id number] Admit date:  03/15/2012  Clinical Social Worker:  Lupita Leash Renlee Floor, LCSWA  Date/time:  03/19/2012 05:15 PM  Clinical Social Work is seeking post-discharge placement for this patient at the following level of care:   SKILLED NURSING   (*CSW will update this form in Epic as items are completed)   03/18/2012  Patient/family provided with [facility] Department of Clinical Social Works list of facilities offering this level of care within the geographic area requested by the patient (or if unable, by the patients family).  03/18/2012  Patient/family informed of their freedom to choose among providers that offer the needed level of care, that participate in Medicare, Medicaid or managed care program needed by the patient, have an available bed and are willing to accept the patient.  03/18/2012  Patient/family informed of [facility] ownership interest in [facility], as well as of the fact that they are under no obligation to receive care at this facility.  PASARR submitted to EDS on 03/19/2012 PASARR number received from EDS on 03/19/2012  FL2 transmitted to all facilities in geographic area requested by pt/family on  03/18/2012 FL2 transmitted to all facilities within larger geographic area on   Patient informed that his/her managed care company has contracts with or will negotiate with  certain facilities, including the following:   NA     Patient/family informed of bed offers received:  03/19/2012 Patient chooses bed at [facility] Physician recommends and patient chooses bed at    Patient to be transferred to [facility] on  03/19/2012 Patient to be transferred to facility by AMbulance  Sharin Mons)  The following physician request were entered in Epic:   Additional Comments:

## 2012-03-22 ENCOUNTER — Encounter (HOSPITAL_COMMUNITY): Payer: Medicare Other

## 2012-03-22 NOTE — Progress Notes (Addendum)
PULMONARY REHAB OUTCOMES REPORT  Patient being dropped at this time due to a recent fall and breakage in her leg. Patients family called and asked [name] to cancel all appointments. Pt will need plenty of recovery and PT before coming back to pulmonary rehab.  No pre paperwork turned in. However, pt did complete a 6 minute walk test, ambulated 456ft on 4LNCC. Patient o2 sats remained at 96-98%. Pt needed to rest 2 times with the walk./ Bp stable. Patient was able to complete one exercise session with [name] and did well. VSS. Look forward to having Melinda Mora back in rehab at a later time.    Agree with the above note Cathie Olden RN

## 2012-03-23 ENCOUNTER — Other Ambulatory Visit: Payer: Self-pay | Admitting: Lab

## 2012-03-23 ENCOUNTER — Ambulatory Visit: Payer: Self-pay | Admitting: Physician Assistant

## 2012-03-23 ENCOUNTER — Encounter (HOSPITAL_COMMUNITY): Payer: Medicare Other

## 2012-03-23 NOTE — Progress Notes (Signed)
[facility] today.  Letter mailed to patient.  

## 2012-03-24 ENCOUNTER — Encounter (HOSPITAL_COMMUNITY): Payer: Medicare Other

## 2012-03-25 ENCOUNTER — Encounter (HOSPITAL_COMMUNITY): Payer: Medicare Other

## 2012-03-26 ENCOUNTER — Encounter (HOSPITAL_COMMUNITY): Payer: Medicare Other

## 2012-03-29 ENCOUNTER — Encounter (HOSPITAL_COMMUNITY): Payer: Medicare Other

## 2012-03-30 ENCOUNTER — Encounter (HOSPITAL_COMMUNITY): Payer: Medicare Other

## 2012-03-31 ENCOUNTER — Encounter (HOSPITAL_COMMUNITY): Payer: Medicare Other

## 2012-04-01 ENCOUNTER — Encounter (HOSPITAL_COMMUNITY): Payer: Medicare Other

## 2012-04-02 ENCOUNTER — Encounter (HOSPITAL_COMMUNITY): Payer: Medicare Other

## 2012-04-05 ENCOUNTER — Encounter (HOSPITAL_COMMUNITY): Payer: Medicare Other

## 2012-04-06 ENCOUNTER — Encounter (HOSPITAL_COMMUNITY): Payer: Medicare Other

## 2012-04-07 ENCOUNTER — Encounter (HOSPITAL_COMMUNITY): Payer: Medicare Other

## 2012-04-08 ENCOUNTER — Encounter (HOSPITAL_COMMUNITY): Payer: Medicare Other

## 2012-04-09 ENCOUNTER — Encounter (HOSPITAL_COMMUNITY): Payer: Medicare Other

## 2012-04-12 ENCOUNTER — Encounter (HOSPITAL_COMMUNITY): Payer: Medicare Other

## 2012-04-13 ENCOUNTER — Encounter (HOSPITAL_COMMUNITY): Payer: Medicare Other

## 2012-04-14 ENCOUNTER — Encounter (HOSPITAL_COMMUNITY): Payer: Medicare Other

## 2012-04-14 ENCOUNTER — Other Ambulatory Visit: Payer: Self-pay | Admitting: Orthopedic Surgery

## 2012-04-15 ENCOUNTER — Encounter (HOSPITAL_COMMUNITY): Payer: Medicare Other

## 2012-04-16 ENCOUNTER — Encounter: Payer: Self-pay | Admitting: Pulmonary Disease

## 2012-04-16 ENCOUNTER — Ambulatory Visit (INDEPENDENT_AMBULATORY_CARE_PROVIDER_SITE_OTHER): Payer: Medicare Other | Admitting: Pulmonary Disease

## 2012-04-16 ENCOUNTER — Encounter (HOSPITAL_COMMUNITY): Payer: Medicare Other

## 2012-04-16 VITALS — BP 140/80 | HR 72 | Temp 97.6°F | Ht <= 58 in

## 2012-04-16 DIAGNOSIS — S9306XA Dislocation of unspecified ankle joint, initial encounter: Secondary | ICD-10-CM

## 2012-04-16 DIAGNOSIS — J449 Chronic obstructive pulmonary disease, unspecified: Secondary | ICD-10-CM

## 2012-04-16 DIAGNOSIS — S9304XA Dislocation of right ankle joint, initial encounter: Secondary | ICD-10-CM

## 2012-04-16 NOTE — Assessment & Plan Note (Signed)
  She does need blood thinner for prophylaxis for blood clots while she has the cast on & until she is allowed to bear weight. If lovenox not available, can use coumadin to INR 1.5-2.0 Notet hat coumadin + plavix will increase her risk of bleeding, but hoefully her fall risk is much lower in a supervised setting

## 2012-04-16 NOTE — Progress Notes (Signed)
  Subjective:    Patient ID: Melinda Mora, female    DOB: 24-Feb-1934, 78 y.o.   MRN: [id number]  HPI PCP: Dr. Timothy Mora  [name] ex smoker for evaluation of dyspnea.  Initial OV 7/11  She smoked upto 2 PPD for > 60 Pys before quitting in 2009 . Spirometry showed FEV1 of 65% c/w mild obstruction (surprisingly better than expected). CT chest obtained by dr Melinda Mora on 09/20/09 was reviewed & shows moderate hiatal hernia & scattered < 5 mm nodules which are indeterminate.  She is maintained on a regimen of spiriva & ventolin HFA - poor technique >> pulm rehab, spacer  12/04/2011 2y FU - . Pt states her breathing has been getting worse w/ activity, occasional wheezing, legs are very weak.  She had a lot of thigs happen over the last 2y  She was diagnosed with breast CA s/p left partial mastectomy in 2009. 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 She also has a significant cardiac history notable for myocardial infarction 2009 at which time 2 stents were placed, then again in 2010. In December of 2012 she Was seen for ventricular tachycardia Melinda Mora) cardiac stress test was negative. Started on amio since feb '12 , also on Plavix aspirin and nitroglycerin.  She desatn to 91% on walking to the exam room, In PCP's office desatn to 86%  She is now diagnosed with grade 1 endometrial cancer - considered not a good candidate for surgery, and an IUD was placed  CT chest / abd 7/13 rt adnexal cyst 2. Cm, no metastatic disease  Uterine bleeding caused fe def anemia but Hb is now up to 13.1  Spirometry showed fev1 70% s/o moderate obstruction but unchanged form 2011.  ONO did not show significant desaturations  >> started O2, stop amiodarone   01/16/2012 -Desatn to 75% on walking & recovers with o2    04/16/2012 Fell in home with RLE fx. Hx of frequent falls in home. She is in the [facility] ACO program Medicare program. Pt now with dyspnea on exertion and cough  productive of brown green mucus. Pt underwent leg fx repair 11/5 Melinda Mora) Discharged to [facility] on lovenox sq. They are questioning whether she needs to be on [facility] - they would like to change lovenox to coumadin if extended prophylaxis required CXr 11/5 bibasal atx DNr noted. Compliant with O2   Review of Systems neg for any significant sore throat, dysphagia, itching, sneezing, nasal congestion or excess/ purulent secretions, fever, chills, sweats, unintended wt loss, pleuritic or exertional cp, hempoptysis, orthopnea pnd or change in chronic leg swelling. Also denies presyncope, palpitations, heartburn, abdominal pain, nausea, vomiting, diarrhea or change in bowel or urinary habits, dysuria,hematuria, rash, arthralgias, visual complaints, headache, numbness weakness or ataxia.     Objective:   Physical Exam  Gen. Pleasant, well-nourished, in no distress, normal affect ENT - no lesions, no post nasal drip Neck: No JVD, no thyromegaly, no carotid bruits Lungs: no use of accessory muscles, no dullness to percussion, clear without rales or rhonchi  Cardiovascular: Rhythm regular, heart sounds  normal, no murmurs or gallops, no peripheral edema Abdomen: soft and non-tender, no hepatosplenomegaly, BS normal. Musculoskeletal: No deformities, no cyanosis or clubbing Neuro:  alert, non focal        Assessment & Plan:

## 2012-04-16 NOTE — Patient Instructions (Addendum)
You do need blood thinner for prophylaxis for blood clots while you have the cast on & until you are allowed to bear weight

## 2012-04-16 NOTE — Assessment & Plan Note (Signed)
Stable, will reassess O2 needs on next visit Off amio now DNR noted

## 2012-04-19 ENCOUNTER — Encounter (HOSPITAL_COMMUNITY): Payer: Medicare Other

## 2012-04-20 ENCOUNTER — Encounter (HOSPITAL_COMMUNITY): Payer: Medicare Other

## 2012-04-21 ENCOUNTER — Encounter (HOSPITAL_COMMUNITY): Payer: Medicare Other

## 2012-04-22 ENCOUNTER — Encounter (HOSPITAL_COMMUNITY): Payer: Medicare Other

## 2012-04-23 ENCOUNTER — Encounter (HOSPITAL_COMMUNITY): Payer: Medicare Other

## 2012-04-26 ENCOUNTER — Encounter (HOSPITAL_COMMUNITY): Payer: Medicare Other

## 2012-04-27 ENCOUNTER — Encounter (HOSPITAL_COMMUNITY): Payer: Medicare Other

## 2012-04-28 ENCOUNTER — Encounter (HOSPITAL_COMMUNITY): Payer: Medicare Other

## 2012-04-29 ENCOUNTER — Encounter (HOSPITAL_COMMUNITY): Payer: Medicare Other

## 2012-04-30 ENCOUNTER — Encounter (HOSPITAL_COMMUNITY): Payer: Medicare Other

## 2012-05-03 ENCOUNTER — Encounter (HOSPITAL_COMMUNITY): Payer: Medicare Other

## 2012-05-04 ENCOUNTER — Encounter (HOSPITAL_COMMUNITY): Payer: Medicare Other

## 2012-05-05 ENCOUNTER — Encounter (HOSPITAL_COMMUNITY): Payer: Medicare Other

## 2012-05-06 ENCOUNTER — Encounter (HOSPITAL_COMMUNITY): Payer: Medicare Other

## 2012-05-07 ENCOUNTER — Encounter (HOSPITAL_COMMUNITY): Payer: Medicare Other

## 2012-05-10 ENCOUNTER — Encounter (HOSPITAL_COMMUNITY): Payer: Medicare Other

## 2012-05-11 ENCOUNTER — Encounter (HOSPITAL_COMMUNITY): Payer: Medicare Other

## 2012-05-12 ENCOUNTER — Encounter (HOSPITAL_COMMUNITY): Payer: Medicare Other

## 2012-05-13 ENCOUNTER — Encounter (HOSPITAL_COMMUNITY): Payer: Medicare Other

## 2012-05-14 ENCOUNTER — Encounter (HOSPITAL_COMMUNITY): Payer: Medicare Other

## 2012-05-17 ENCOUNTER — Encounter (HOSPITAL_COMMUNITY): Payer: Medicare Other

## 2012-05-18 ENCOUNTER — Encounter (HOSPITAL_COMMUNITY): Payer: Medicare Other

## 2012-05-19 ENCOUNTER — Encounter (HOSPITAL_COMMUNITY): Payer: Medicare Other

## 2012-05-20 ENCOUNTER — Encounter (HOSPITAL_COMMUNITY): Payer: Medicare Other

## 2012-05-21 ENCOUNTER — Encounter (HOSPITAL_COMMUNITY): Payer: Medicare Other

## 2012-05-24 ENCOUNTER — Encounter (HOSPITAL_COMMUNITY): Payer: Medicare Other

## 2012-05-26 ENCOUNTER — Encounter (HOSPITAL_COMMUNITY): Payer: Medicare Other

## 2012-05-28 ENCOUNTER — Encounter (HOSPITAL_COMMUNITY): Payer: Medicare Other

## 2012-05-31 ENCOUNTER — Encounter (HOSPITAL_COMMUNITY): Payer: Medicare Other

## 2012-06-02 ENCOUNTER — Encounter (HOSPITAL_COMMUNITY): Payer: Medicare Other

## 2012-06-04 ENCOUNTER — Encounter (HOSPITAL_COMMUNITY): Payer: Medicare Other

## 2012-06-07 ENCOUNTER — Encounter (HOSPITAL_COMMUNITY): Payer: Medicare Other

## 2012-06-09 ENCOUNTER — Encounter (HOSPITAL_COMMUNITY): Payer: Medicare Other

## 2012-06-11 ENCOUNTER — Encounter (HOSPITAL_COMMUNITY): Payer: Medicare Other

## 2012-06-14 ENCOUNTER — Encounter (HOSPITAL_COMMUNITY): Payer: Medicare Other

## 2012-06-16 ENCOUNTER — Encounter (HOSPITAL_COMMUNITY): Payer: Medicare Other

## 2012-06-18 ENCOUNTER — Encounter (HOSPITAL_COMMUNITY): Payer: Medicare Other

## 2012-06-21 ENCOUNTER — Encounter (HOSPITAL_COMMUNITY): Payer: Medicare Other

## 2012-06-23 ENCOUNTER — Encounter (HOSPITAL_COMMUNITY): Payer: Medicare Other

## 2012-06-25 ENCOUNTER — Encounter (HOSPITAL_COMMUNITY): Payer: Medicare Other

## 2012-06-28 ENCOUNTER — Encounter (HOSPITAL_COMMUNITY): Payer: Medicare Other

## 2012-06-30 ENCOUNTER — Encounter (HOSPITAL_COMMUNITY): Payer: Medicare Other

## 2012-07-02 ENCOUNTER — Encounter (HOSPITAL_COMMUNITY): Payer: Medicare Other

## 2012-07-05 ENCOUNTER — Encounter (HOSPITAL_COMMUNITY): Payer: Medicare Other

## 2012-07-07 ENCOUNTER — Encounter (HOSPITAL_COMMUNITY): Payer: Medicare Other

## 2012-07-07 ENCOUNTER — Telehealth: Payer: Self-pay | Admitting: Pulmonary Disease

## 2012-07-07 NOTE — Telephone Encounter (Signed)
Reviewed dr Ferd Hibbs note- stop coumadin & get back on plavix She may not be able to bear wt although cast is off OK with me -will remain at some risk for DVt

## 2012-07-09 ENCOUNTER — Encounter (HOSPITAL_COMMUNITY): Payer: Medicare Other

## 2012-07-12 ENCOUNTER — Encounter (HOSPITAL_COMMUNITY): Payer: Medicare Other

## 2012-07-14 ENCOUNTER — Encounter (HOSPITAL_COMMUNITY): Payer: Medicare Other

## 2012-07-15 ENCOUNTER — Encounter: Payer: Self-pay | Admitting: Adult Health

## 2012-07-15 ENCOUNTER — Ambulatory Visit (INDEPENDENT_AMBULATORY_CARE_PROVIDER_SITE_OTHER): Payer: Medicare Other | Admitting: Adult Health

## 2012-07-15 VITALS — BP 108/62 | HR 58 | Temp 97.3°F | Ht 59.0 in

## 2012-07-15 DIAGNOSIS — J449 Chronic obstructive pulmonary disease, unspecified: Secondary | ICD-10-CM

## 2012-07-15 NOTE — Patient Instructions (Addendum)
Continue on current regimen .  follow up Dr. Alva  In 4 months and As needed    

## 2012-07-15 NOTE — Progress Notes (Signed)
Subjective:    Patient ID: Melinda Mora, female    DOB: 1934/04/14, 79 y.o.   MRN: [id number]  HPI  PCP: Dr. Timothy Lasso  [name] ex smoker for evaluation of dyspnea.  Initial OV 7/11  She smoked upto 2 PPD for > 60 Pys before quitting in 2009 . Spirometry showed FEV1 of 65% c/w mild obstruction (surprisingly better than expected). CT chest obtained by dr Darnelle Catalan on 09/20/09 was reviewed & shows moderate hiatal hernia & scattered < 5 mm nodules which are indeterminate.  She is maintained on a regimen of spiriva & ventolin HFA - poor technique >> pulm rehab, spacer  12/04/2011 2y FU - . Pt states her breathing has been getting worse w/ activity, occasional wheezing, legs are very weak.  She had a lot of thigs happen over the last 2y  She was diagnosed with breast CA s/p left partial mastectomy in 2009. 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 She also has a significant cardiac history notable for myocardial infarction 2009 at which time 2 stents were placed, then again in 2010. In December of 2012 she Was seen for ventricular tachycardia Ladona Ridgel) cardiac stress test was negative. Started on amio since feb '12 , also on Plavix aspirin and nitroglycerin.  She desatn to 91% on walking to the exam room, In PCP's office desatn to 86%  She is now diagnosed with grade 1 endometrial cancer - considered not a good candidate for surgery, and an IUD was placed  CT chest / abd 7/13 rt adnexal cyst 2. Cm, no metastatic disease  Uterine bleeding caused fe def anemia but Hb is now up to 13.1  Spirometry showed fev1 70% s/o moderate obstruction but unchanged form 2011.  ONO did not show significant desaturations  >> started O2, stop amiodarone   01/16/2012 -Desatn to 75% on walking & recovers with o2    04/16/2013 Fell in home with RLE fx. Hx of frequent falls in home. She is in the [facility] ACO program Medicare program. Pt now with dyspnea on exertion and cough  productive of brown green mucus. Pt underwent leg fx repair 11/5 Dion Saucier) Discharged to [facility] on lovenox sq. They are questioning whether she needs to be on [facility] - they would like to change lovenox to coumadin if extended prophylaxis required CXr 11/5 bibasal atx DNR noted. Compliant with O2 >>no changes   07/15/2012 Follow up for COPD  Returns for 3 month follow up .  No change in dyspnea level.  Remains on symbicort and spiriva .  No flare of cough, wheezing or dyspnea.  No weight loss, hemoptysis , fever or edema.     Review of Systems  Constitutional:   No  weight loss, night sweats,  Fevers, chills,  +fatigue, or  lassitude.  HEENT:   No headaches,  Difficulty swallowing,  Tooth/dental problems, or  Sore throat,                No sneezing, itching, ear ache, nasal congestion, post nasal drip,   CV:  No chest pain,  Orthopnea, PND, swelling in lower extremities, anasarca, dizziness, palpitations, syncope.   GI  No heartburn, indigestion, abdominal pain, nausea, vomiting, diarrhea, change in bowel habits, loss of appetite, bloody stools.   Resp:    No coughing up of blood.  No change in color of mucus.  No wheezing.  No chest wall deformity  Skin: no rash or lesions.  GU: no  dysuria, change in color of urine, no urgency or frequency.  No flank pain, no hematuria   MS:  No joint pain or swelling.  No decreased range of motion.  No back pain.  Psych:  No change in mood or affect. No depression or anxiety.  No memory loss.         Objective:   Physical Exam   Gen. Pleasant, , in no distress, normal affect ENT - no lesions, no post nasal drip Neck: No JVD, no thyromegaly, no carotid bruits Lungs: no use of accessory muscles, no dullness to percussion, clear without rales or rhonchi  Cardiovascular: Rhythm regular, heart sounds  normal, no murmurs or gallops, no peripheral edema Abdomen: soft and non-tender, no hepatosplenomegaly, BS normal. Musculoskeletal: No  deformities, no cyanosis or clubbing Neuro:  alert, non focal        Assessment & Plan:

## 2012-07-16 ENCOUNTER — Encounter (HOSPITAL_COMMUNITY): Payer: Medicare Other

## 2012-07-19 ENCOUNTER — Encounter (HOSPITAL_COMMUNITY): Payer: Medicare Other

## 2012-07-19 NOTE — Assessment & Plan Note (Addendum)
Compensated on present regimen without flare  Cont on regimen.  Follow up 4 months with Dr. [name]

## 2012-07-21 ENCOUNTER — Encounter (HOSPITAL_COMMUNITY): Payer: Medicare Other

## 2012-07-23 ENCOUNTER — Encounter (HOSPITAL_COMMUNITY): Payer: Medicare Other

## 2012-07-26 ENCOUNTER — Encounter (HOSPITAL_COMMUNITY): Payer: Medicare Other

## 2012-07-28 ENCOUNTER — Encounter (HOSPITAL_COMMUNITY): Payer: Medicare Other

## 2012-07-30 ENCOUNTER — Encounter (HOSPITAL_COMMUNITY): Payer: Medicare Other

## 2012-08-02 ENCOUNTER — Encounter (HOSPITAL_COMMUNITY): Payer: Medicare Other

## 2012-08-04 ENCOUNTER — Encounter (HOSPITAL_COMMUNITY): Payer: Medicare Other

## 2012-08-05 ENCOUNTER — Encounter (INDEPENDENT_AMBULATORY_CARE_PROVIDER_SITE_OTHER): Payer: Self-pay | Admitting: General Surgery

## 2012-08-06 ENCOUNTER — Encounter (HOSPITAL_COMMUNITY): Payer: Medicare Other

## 2012-08-09 ENCOUNTER — Encounter (HOSPITAL_COMMUNITY): Payer: Medicare Other

## 2012-08-11 ENCOUNTER — Encounter (HOSPITAL_COMMUNITY): Payer: Medicare Other

## 2012-08-13 ENCOUNTER — Encounter (HOSPITAL_COMMUNITY): Payer: Medicare Other

## 2012-08-16 ENCOUNTER — Encounter (HOSPITAL_COMMUNITY): Payer: Medicare Other

## 2012-08-18 ENCOUNTER — Encounter (HOSPITAL_COMMUNITY): Payer: Medicare Other

## 2012-08-20 ENCOUNTER — Encounter (HOSPITAL_COMMUNITY): Payer: Medicare Other

## 2012-08-23 ENCOUNTER — Encounter (HOSPITAL_COMMUNITY): Payer: Medicare Other

## 2012-08-25 ENCOUNTER — Encounter (HOSPITAL_COMMUNITY): Payer: Medicare Other

## 2012-08-27 ENCOUNTER — Encounter (HOSPITAL_COMMUNITY): Payer: Medicare Other

## 2012-08-30 ENCOUNTER — Encounter (HOSPITAL_COMMUNITY): Payer: Medicare Other

## 2012-09-01 ENCOUNTER — Encounter (HOSPITAL_COMMUNITY): Payer: Medicare Other

## 2012-09-03 ENCOUNTER — Encounter (HOSPITAL_COMMUNITY): Payer: Medicare Other

## 2012-09-06 ENCOUNTER — Encounter (HOSPITAL_COMMUNITY): Payer: Medicare Other

## 2012-09-08 ENCOUNTER — Encounter (HOSPITAL_COMMUNITY): Payer: Medicare Other

## 2012-09-10 ENCOUNTER — Telehealth: Payer: Self-pay | Admitting: Pulmonary Disease

## 2012-09-10 NOTE — Telephone Encounter (Signed)
88% ok 3L pulse ok

## 2012-09-10 NOTE — Telephone Encounter (Signed)
[email or web address] x1 for [employer]

## 2012-09-10 NOTE — Telephone Encounter (Signed)
Spoke with Victorino Dike at [employer] [facility] [employer] She needs clarification on o2  She states pt is on 3lpm pulsed all the time, but according to our recs it is at 2lpm pulsed with exertion She states that she walked her on 3lpm pulsed and her sats drop to 88%  Needs clarification on o2 and order for it to be switched from pulsed to continuous Please advise thanks!

## 2012-09-10 NOTE — Telephone Encounter (Signed)
Melinda Mora is aware

## 2012-09-30 ENCOUNTER — Emergency Department (HOSPITAL_COMMUNITY): Payer: Medicare Other

## 2012-09-30 ENCOUNTER — Emergency Department (HOSPITAL_COMMUNITY)
Admission: EM | Admit: 2012-09-30 | Discharge: 2012-09-30 | Disposition: A | Discharge status: Nursing Home (Includes ICF) | Payer: Medicare Other | Attending: Emergency Medicine | Admitting: Emergency Medicine

## 2012-09-30 ENCOUNTER — Encounter (HOSPITAL_COMMUNITY): Payer: Self-pay | Admitting: Nurse Practitioner

## 2012-09-30 DIAGNOSIS — S79919A Unspecified injury of unspecified hip, initial encounter: Secondary | ICD-10-CM | POA: Insufficient documentation

## 2012-09-30 DIAGNOSIS — Z9861 Coronary angioplasty status: Secondary | ICD-10-CM | POA: Insufficient documentation

## 2012-09-30 DIAGNOSIS — Z8709 Personal history of other diseases of the respiratory system: Secondary | ICD-10-CM | POA: Insufficient documentation

## 2012-09-30 DIAGNOSIS — Z880 Allergy status to penicillin: Secondary | ICD-10-CM | POA: Insufficient documentation

## 2012-09-30 DIAGNOSIS — I10 Essential (primary) hypertension: Secondary | ICD-10-CM | POA: Insufficient documentation

## 2012-09-30 DIAGNOSIS — J449 Chronic obstructive pulmonary disease, unspecified: Secondary | ICD-10-CM | POA: Insufficient documentation

## 2012-09-30 DIAGNOSIS — Z87891 Personal history of nicotine dependence: Secondary | ICD-10-CM | POA: Insufficient documentation

## 2012-09-30 DIAGNOSIS — S4980XA Other specified injuries of shoulder and upper arm, unspecified arm, initial encounter: Secondary | ICD-10-CM | POA: Insufficient documentation

## 2012-09-30 DIAGNOSIS — E785 Hyperlipidemia, unspecified: Secondary | ICD-10-CM | POA: Insufficient documentation

## 2012-09-30 DIAGNOSIS — Z85828 Personal history of other malignant neoplasm of skin: Secondary | ICD-10-CM | POA: Insufficient documentation

## 2012-09-30 DIAGNOSIS — W1809XA Striking against other object with subsequent fall, initial encounter: Secondary | ICD-10-CM | POA: Insufficient documentation

## 2012-09-30 DIAGNOSIS — W19XXXA Unspecified fall, initial encounter: Secondary | ICD-10-CM

## 2012-09-30 DIAGNOSIS — Z860100 Personal history of colon polyps, unspecified: Secondary | ICD-10-CM | POA: Insufficient documentation

## 2012-09-30 DIAGNOSIS — Y9389 Activity, other specified: Secondary | ICD-10-CM | POA: Insufficient documentation

## 2012-09-30 DIAGNOSIS — S46909A Unspecified injury of unspecified muscle, fascia and tendon at shoulder and upper arm level, unspecified arm, initial encounter: Secondary | ICD-10-CM | POA: Insufficient documentation

## 2012-09-30 DIAGNOSIS — M1990 Unspecified osteoarthritis, unspecified site: Secondary | ICD-10-CM | POA: Insufficient documentation

## 2012-09-30 DIAGNOSIS — Z8669 Personal history of other diseases of the nervous system and sense organs: Secondary | ICD-10-CM | POA: Insufficient documentation

## 2012-09-30 DIAGNOSIS — Z7982 Long term (current) use of aspirin: Secondary | ICD-10-CM | POA: Insufficient documentation

## 2012-09-30 DIAGNOSIS — J4489 Other specified chronic obstructive pulmonary disease: Secondary | ICD-10-CM | POA: Insufficient documentation

## 2012-09-30 DIAGNOSIS — Z79899 Other long term (current) drug therapy: Secondary | ICD-10-CM | POA: Insufficient documentation

## 2012-09-30 DIAGNOSIS — Z8719 Personal history of other diseases of the digestive system: Secondary | ICD-10-CM | POA: Insufficient documentation

## 2012-09-30 DIAGNOSIS — I2510 Atherosclerotic heart disease of native coronary artery without angina pectoris: Secondary | ICD-10-CM | POA: Insufficient documentation

## 2012-09-30 DIAGNOSIS — Z8659 Personal history of other mental and behavioral disorders: Secondary | ICD-10-CM | POA: Insufficient documentation

## 2012-09-30 DIAGNOSIS — IMO0002 Reserved for concepts with insufficient information to code with codable children: Secondary | ICD-10-CM | POA: Insufficient documentation

## 2012-09-30 DIAGNOSIS — Z8679 Personal history of other diseases of the circulatory system: Secondary | ICD-10-CM | POA: Insufficient documentation

## 2012-09-30 DIAGNOSIS — Z87828 Personal history of other (healed) physical injury and trauma: Secondary | ICD-10-CM | POA: Insufficient documentation

## 2012-09-30 DIAGNOSIS — F0390 Unspecified dementia without behavioral disturbance: Secondary | ICD-10-CM | POA: Insufficient documentation

## 2012-09-30 DIAGNOSIS — I252 Old myocardial infarction: Secondary | ICD-10-CM | POA: Insufficient documentation

## 2012-09-30 DIAGNOSIS — Z8781 Personal history of (healed) traumatic fracture: Secondary | ICD-10-CM | POA: Insufficient documentation

## 2012-09-30 DIAGNOSIS — Z8739 Personal history of other diseases of the musculoskeletal system and connective tissue: Secondary | ICD-10-CM | POA: Insufficient documentation

## 2012-09-30 DIAGNOSIS — Y9210 Unspecified residential institution as the place of occurrence of the external cause: Secondary | ICD-10-CM | POA: Insufficient documentation

## 2012-09-30 DIAGNOSIS — Z8674 Personal history of sudden cardiac arrest: Secondary | ICD-10-CM | POA: Insufficient documentation

## 2012-09-30 NOTE — ED Provider Notes (Signed)
History     CSN: [id number]  Arrival date & time 09/30/12  1231   First MD Initiated Contact with Patient 09/30/12 1252      Chief Complaint  Patient presents with  . Fall    (Consider location/radiation/quality/duration/timing/severity/associated sxs/prior treatment) HPI Patient presents from the nursing home 15 hours after a fall. The patient has full recall of the event, states that she was using the bathroom, stumbled, fell into a bathtub. She denies head,, any subsequent head pain, neck pain. The patient has not been ambulatory since the event secondary to pain in her hips bilaterally. Just complains of pain in her left superior shoulder. All pain is sore, with no attempts at relief thus far. Pain is worse with motion. She denies any current nausea, vomiting, lightheadedness, chest pain, dyspnea. The patient does have a history of dementia, which limits history of present illness, but during my exam she is awake alert, with good recall, seemingly, of the entire event. The patient's daughter corroborates her account.   Past Medical History  Diagnosis Date  . Hyperlipidemia   . Hypertension   . Depression   . Osteoarthritis   . Allergic rhinitis   . Raynaud disease   . Adenomatous polyp 03/2004  . Diverticulosis   . Syncope Jan 2013    NSVT noted on event monitor  . COPD (chronic obstructive pulmonary disease) with emphysema   . Pulmonary nodule, right x2  middle and lower lobes--  stable per follow-ip ct  jan 2012  . S/P primary angioplasty with coronary stent   . History of acute inferior wall myocardial infarction SEPT 2008      S/P STENTING PROXIMAL TO MID RCA  . Endometrial hyperplasia   . History of breast cancer 2011--  ONCOLOGIST- DR Camelia Eng    S/P LEFT PARTIAL MASTECTOMY AND RADIATION---  NO RECURRENCE  . Dyspnea   . Anxiety   . CAD (coronary artery disease) CARDIOLOGIST-- DR Lewayne Bunting--- LAST VISIT 08-16-2011  NOTE IN EPIC    Remote inferior MI in  2008. Extensive stenting of the RCA, s/p repeat stenting of the RCA in Dec 2010 due to restenosis  . Dry eyes, bilateral   . Heart attack   . Carotid stenosis, asymptomatic BILATERAL ICA --  0-39%  PER  DUPLEX 05-22-2011  . Clotting disorder   . Closed dislocation of right ankle, with complex fracture 03/16/2012  . Cancer     breast (lumpectomy on left breast 2 years ago)    Past Surgical History  Procedure Laterality Date  . Tympanostomy tube placement    . Colonoscopy  JULY 2013  . US echocardiography  05-22-2011    LVSF NORMAL/ EF 60-65%  . Left partial mastectomy w/ bx axillary lymph node   09-04-2009    INVASIVE MAMMARY CARCINOMA/  STAGE T1b NO  . Coronary angioplasty with stent placement  04-19-2009  DR PETER [place]    SINGLE VESSEL OBSTRUCTIVE DISEASE/  STENTING OF IN-STENT RESTENOSIS MID RCA/ NORMAL LVF  . Coronary angioplasty with stent placement  SEPT 2008--  [facility]    STENTING PROXIMAL TO MID RCA  . Cardiovascular stress test  05-28-2011    LOW RISK STUDY  . Bilateral breast lumpectomy  BEFORE 2008  . Dilation and curettage of uterus  10/10/11  . Cataract extraction w/ intraocular lens  implant, bilateral    . Intrauterine device insertion    . Orif ankle fracture  03/16/2012    RIGHT ANKLE  .  Orif ankle fracture  03/16/2012    Procedure: OPEN REDUCTION INTERNAL FIXATION (ORIF) ANKLE FRACTURE;  Surgeon: Eulas Post, MD;  Location: [facility] OR;  Service: Orthopedics;  Laterality: Right;    Family History  Problem Relation Age of Onset  . Coronary artery disease Father     & Mother  . Heart attack Father   . Gout Father   . Osteoarthritis Mother   . Heart disease Mother     congestive heart failure  . Hypertension Sister   . Heart disease Sister   . Cancer Sister     breast  . Breast cancer      Aunts  . Uterine cancer Sister   . Colon cancer Neg Hx     History  Substance Use Topics  . Smoking status: Former Smoker -- 2.00 packs/day for 50  years    Types: Cigarettes    Quit date: 01/11/2007  . Smokeless tobacco: Never Used  . Alcohol Use: 3.6 oz/week    6 Glasses of wine per week     Comment: OCCASSIONAL    OB History   Grav Para Term Preterm Abortions TAB SAB Ect Mult Living   1 1 1       1       Review of Systems  Unable to perform ROS: Dementia    Allergies  Codeine; Penicillins; and Shellfish allergy  Home Medications   Current Outpatient Rx  Name  Route  Sig  Dispense  Refill  . albuterol (VENTOLIN HFA) 108 (90 BASE) MCG/ACT inhaler   Inhalation   Inhale 2 puffs into the lungs every 6 (six) hours as needed.          Marland Kitchen atorvastatin (LIPITOR) 40 MG tablet   Oral   Take 1 tablet (40 mg total) by mouth daily.   90 tablet   3   . budesonide-formoterol (SYMBICORT) 160-4.5 MCG/ACT inhaler   Inhalation   Inhale 2 puffs into the lungs 2 (two) times daily.         . calcium-vitamin D (OSCAL WITH D) 500-200 MG-UNIT per tablet   Oral   Take 1 tablet by mouth daily.         . cilostazol (PLETAL) 50 MG tablet   Oral   Take 50 mg by mouth 2 (two) times daily.          . metoprolol (LOPRESSOR) 50 MG tablet   Oral   Take 1 tablet (50 mg total) by mouth 2 (two) times daily.   180 tablet   3   . Multiple Vitamin (MULTIVITAMIN) tablet   Oral   Take 1 tablet by mouth daily.          . nitroGLYCERIN (NITROSTAT) 0.4 MG SL tablet   Sublingual   Place 0.4 mg under the tongue every 5 (five) minutes as needed.          . Omega-3 Fatty Acids (FISH OIL) 1200 MG CAPS   Oral   Take 1 capsule by mouth daily.         Marland Kitchen tiotropium (SPIRIVA) 18 MCG inhalation capsule   Inhalation   Place 18 mcg into inhaler and inhale every morning.            BP 139/61  Pulse 55  Temp(Src) 98.1 F (36.7 C) (Oral)  Resp 20  SpO2 96%  Physical Exam  Nursing note and vitals reviewed. Constitutional: She is oriented to person, place, and time. She appears well-developed and well-nourished. No  distress.   HENT:  Head: Normocephalic and atraumatic.  Eyes: Conjunctivae and EOM are normal.  Cardiovascular: Normal rate and regular rhythm.   Pulmonary/Chest: Effort normal and breath sounds normal. No stridor. No respiratory distress.  Abdominal: She exhibits no distension.  Musculoskeletal: She exhibits no edema.       Left shoulder: She exhibits tenderness, bony tenderness and pain. She exhibits normal range of motion, no swelling, no effusion, no crepitus, no deformity, no laceration, no spasm, normal pulse and normal strength.       Right hip: She exhibits tenderness and bony tenderness. She exhibits normal range of motion, normal strength, no swelling, no crepitus, no deformity and no laceration.       Left hip: She exhibits decreased range of motion, tenderness and bony tenderness. She exhibits normal strength, no swelling, no crepitus, no deformity and no laceration.       Right knee: Normal.       Left knee: Normal.       Right ankle: Normal.       Left ankle: Normal.  Neurological: She is alert and oriented to person, place, and time. No cranial nerve deficit.  Skin: Skin is warm and dry.  Psychiatric: She has a normal mood and affect. Her speech is normal and behavior is normal. Cognition and memory are impaired.    ED Course  Procedures (including critical care time)  Labs Reviewed - No data to display No results found.   No diagnosis found.  O2- 99%ra, normal  HR 60 sr, normal  2:30 PM On repeat exam the patient appears calm, no new complaints. We discussed x-ray findings. I interpreted the x-ray, no acute fractures.  she, and her daughter note that the patient isminimally ambulatory, using a wheelchair, occasionally with a walker.   MDM  This patient presents after a fall, approximately 15 hours prior to emergency department evaluation.  On exam she is awake alert, neurologically intact.  Hemodynamically stable, and her x-rays do not demonstrate acute fracture.  After  discussion on return precautions, the patient was discharged in stable condition.        Gerhard Munch, MD 09/30/12 1432

## 2012-09-30 NOTE — Progress Notes (Signed)
CSW confirmed patient is a resident at [facility] receiving minimal assitance in independent living area. Pt plans to return when medically stable and states, "I'm ready to go whenever ya'll are ready to send me home."   .Catha Gosselin, Theresia Majors  [phone] .09/30/2012 1426pm

## 2012-09-30 NOTE — ED Notes (Signed)
Per pt:  Pt lives at [facility].  Pt fell at midnight this morning into the bathtub onto her left side; EMS was called and pt denied head, neck nor back injury; no bruises noted by EMS at that time of fall.  Pt refused transport to ED. This morning, the nursing staff noticed upper left arm and lower left trunk area and pt reluctantly agreed to come to ED for evaluation.  Hx: Plavix, MI, stints, cancer, depression, dementia/a;zheimer disease, COPD, home O2 at 2-3L

## 2012-09-30 NOTE — ED Notes (Signed)
[id number]<AV> Expected date:<BR> Expected time:<BR> Means of arrival:<BR> Comments:<BR> ems- fall last night, left side sore

## 2013-03-11 ENCOUNTER — Other Ambulatory Visit: Payer: Self-pay | Admitting: *Deleted

## 2013-03-11 MED ORDER — METOPROLOL TARTRATE 50 MG PO TABS: 50.0000 mg | ORAL_TABLET | Freq: Two times a day (BID) | ORAL | Status: DC

## 2013-03-11 NOTE — Telephone Encounter (Signed)
rx filled per protocol  

## 2013-03-21 ENCOUNTER — Other Ambulatory Visit: Payer: Self-pay | Admitting: Internal Medicine

## 2013-03-21 DIAGNOSIS — R911 Solitary pulmonary nodule: Secondary | ICD-10-CM

## 2013-03-21 DIAGNOSIS — R0602 Shortness of breath: Secondary | ICD-10-CM

## 2013-03-22 ENCOUNTER — Other Ambulatory Visit: Payer: Self-pay

## 2013-03-23 ENCOUNTER — Ambulatory Visit
Admission: RE | Admit: 2013-03-23 | Discharge: 2013-03-23 | Disposition: A | Discharge status: Home / Self Care | Payer: Medicare Other | Source: Ambulatory Visit | Attending: Internal Medicine | Admitting: Internal Medicine

## 2013-03-23 DIAGNOSIS — R0602 Shortness of breath: Secondary | ICD-10-CM

## 2013-03-23 DIAGNOSIS — R911 Solitary pulmonary nodule: Secondary | ICD-10-CM

## 2013-10-06 ENCOUNTER — Encounter (HOSPITAL_COMMUNITY): Payer: Self-pay | Admitting: Emergency Medicine

## 2013-10-06 ENCOUNTER — Emergency Department (HOSPITAL_COMMUNITY): Payer: Medicare Other

## 2013-10-06 ENCOUNTER — Inpatient Hospital Stay (HOSPITAL_COMMUNITY)
Admission: EM | Admit: 2013-10-06 | Discharge: 2013-10-10 | DRG: 190 | Disposition: A | Discharge status: Skilled Nursing Facility | Payer: Medicare Other | Attending: Internal Medicine | Admitting: Internal Medicine

## 2013-10-06 DIAGNOSIS — I10 Essential (primary) hypertension: Secondary | ICD-10-CM | POA: Diagnosis present

## 2013-10-06 DIAGNOSIS — E785 Hyperlipidemia, unspecified: Secondary | ICD-10-CM | POA: Diagnosis present

## 2013-10-06 DIAGNOSIS — Z66 Do not resuscitate: Secondary | ICD-10-CM | POA: Diagnosis present

## 2013-10-06 DIAGNOSIS — Z9861 Coronary angioplasty status: Secondary | ICD-10-CM

## 2013-10-06 DIAGNOSIS — I472 Ventricular tachycardia, unspecified: Secondary | ICD-10-CM | POA: Diagnosis present

## 2013-10-06 DIAGNOSIS — K5900 Constipation, unspecified: Secondary | ICD-10-CM | POA: Diagnosis present

## 2013-10-06 DIAGNOSIS — I739 Peripheral vascular disease, unspecified: Secondary | ICD-10-CM | POA: Diagnosis present

## 2013-10-06 DIAGNOSIS — Z87891 Personal history of nicotine dependence: Secondary | ICD-10-CM

## 2013-10-06 DIAGNOSIS — F3289 Other specified depressive episodes: Secondary | ICD-10-CM | POA: Diagnosis present

## 2013-10-06 DIAGNOSIS — M1990 Unspecified osteoarthritis, unspecified site: Secondary | ICD-10-CM | POA: Diagnosis present

## 2013-10-06 DIAGNOSIS — I4729 Other ventricular tachycardia: Secondary | ICD-10-CM | POA: Diagnosis present

## 2013-10-06 DIAGNOSIS — I7300 Raynaud's syndrome without gangrene: Secondary | ICD-10-CM | POA: Diagnosis present

## 2013-10-06 DIAGNOSIS — Z853 Personal history of malignant neoplasm of breast: Secondary | ICD-10-CM

## 2013-10-06 DIAGNOSIS — Z7982 Long term (current) use of aspirin: Secondary | ICD-10-CM

## 2013-10-06 DIAGNOSIS — Z79899 Other long term (current) drug therapy: Secondary | ICD-10-CM

## 2013-10-06 DIAGNOSIS — F0390 Unspecified dementia without behavioral disturbance: Secondary | ICD-10-CM | POA: Diagnosis present

## 2013-10-06 DIAGNOSIS — F411 Generalized anxiety disorder: Secondary | ICD-10-CM | POA: Diagnosis present

## 2013-10-06 DIAGNOSIS — J449 Chronic obstructive pulmonary disease, unspecified: Secondary | ICD-10-CM

## 2013-10-06 DIAGNOSIS — Z9849 Cataract extraction status, unspecified eye: Secondary | ICD-10-CM

## 2013-10-06 DIAGNOSIS — I2510 Atherosclerotic heart disease of native coronary artery without angina pectoris: Secondary | ICD-10-CM | POA: Diagnosis present

## 2013-10-06 DIAGNOSIS — I252 Old myocardial infarction: Secondary | ICD-10-CM

## 2013-10-06 DIAGNOSIS — Z8542 Personal history of malignant neoplasm of other parts of uterus: Secondary | ICD-10-CM

## 2013-10-06 DIAGNOSIS — J189 Pneumonia, unspecified organism: Secondary | ICD-10-CM | POA: Diagnosis present

## 2013-10-06 DIAGNOSIS — F329 Major depressive disorder, single episode, unspecified: Secondary | ICD-10-CM | POA: Diagnosis present

## 2013-10-06 DIAGNOSIS — J441 Chronic obstructive pulmonary disease with (acute) exacerbation: Principal | ICD-10-CM | POA: Diagnosis present

## 2013-10-06 DIAGNOSIS — Z7902 Long term (current) use of antithrombotics/antiplatelets: Secondary | ICD-10-CM

## 2013-10-06 DIAGNOSIS — Z9981 Dependence on supplemental oxygen: Secondary | ICD-10-CM

## 2013-10-06 LAB — CBC WITH DIFFERENTIAL/PLATELET
BASOS ABS: 0 10*3/uL (ref 0.0–0.1)
Basophils Relative: 0 % (ref 0–1)
Eosinophils Absolute: 0.1 10*3/uL (ref 0.0–0.7)
Eosinophils Relative: 1 % (ref 0–5)
HCT: 40.7 % (ref 36.0–46.0)
Hemoglobin: 13.1 g/dL (ref 12.0–15.0)
LYMPHS ABS: 2 10*3/uL (ref 0.7–4.0)
LYMPHS PCT: 18 % (ref 12–46)
MCH: 29.4 pg (ref 26.0–34.0)
MCHC: 32.2 g/dL (ref 30.0–36.0)
MCV: 91.5 fL (ref 78.0–100.0)
Monocytes Absolute: 0.6 10*3/uL (ref 0.1–1.0)
Monocytes Relative: 5 % (ref 3–12)
NEUTROS ABS: 8.4 10*3/uL — AB (ref 1.7–7.7)
NEUTROS PCT: 76 % (ref 43–77)
PLATELETS: 210 10*3/uL (ref 150–400)
RBC: 4.45 MIL/uL (ref 3.87–5.11)
RDW: 15.8 % — AB (ref 11.5–15.5)
WBC: 11 10*3/uL — AB (ref 4.0–10.5)

## 2013-10-06 LAB — I-STAT CG4 LACTIC ACID, ED: LACTIC ACID, VENOUS: 2.02 mmol/L (ref 0.5–2.2)

## 2013-10-06 LAB — BASIC METABOLIC PANEL
BUN: 18 mg/dL (ref 6–23)
CALCIUM: 9.2 mg/dL (ref 8.4–10.5)
CHLORIDE: 104 meq/L (ref 96–112)
CO2: 26 meq/L (ref 19–32)
Creatinine, Ser: 0.91 mg/dL (ref 0.50–1.10)
GFR calc Af Amer: 69 mL/min — ABNORMAL LOW (ref >90)
GFR calc non Af Amer: 59 mL/min — ABNORMAL LOW (ref >90)
GLUCOSE: 170 mg/dL — AB (ref 70–99)
POTASSIUM: 3.3 meq/L — AB (ref 3.7–5.3)
SODIUM: 141 meq/L (ref 137–147)

## 2013-10-06 LAB — I-STAT TROPONIN, ED: TROPONIN I, POC: 0.05 ng/mL (ref 0.00–0.08)

## 2013-10-06 MED ORDER — DOCUSATE SODIUM 100 MG PO CAPS
100.0000 mg | ORAL_CAPSULE | Freq: Two times a day (BID) | ORAL | Status: DC | PRN
  Filled 2013-10-06: qty 1

## 2013-10-06 MED ORDER — LOSARTAN POTASSIUM 50 MG PO TABS
50.0000 mg | ORAL_TABLET | Freq: Two times a day (BID) | ORAL | Status: DC
  Administered 2013-10-06 – 2013-10-10 (×8): 50 mg via ORAL
  Filled 2013-10-06 (×9): qty 1

## 2013-10-06 MED ORDER — IPRATROPIUM BROMIDE 0.02 % IN SOLN
0.5000 mg | Freq: Once | RESPIRATORY_TRACT | Status: AC
  Administered 2013-10-06: 0.5 mg via RESPIRATORY_TRACT
  Filled 2013-10-06: qty 2.5

## 2013-10-06 MED ORDER — TAMSULOSIN HCL 0.4 MG PO CAPS
0.4000 mg | ORAL_CAPSULE | Freq: Every day | ORAL | Status: DC
  Administered 2013-10-06 – 2013-10-10 (×5): 0.4 mg via ORAL
  Filled 2013-10-06 (×5): qty 1

## 2013-10-06 MED ORDER — SENNA 8.6 MG PO TABS
1.0000 | ORAL_TABLET | Freq: Every day | ORAL | Status: DC | PRN
  Filled 2013-10-06: qty 1

## 2013-10-06 MED ORDER — NITROGLYCERIN 0.4 MG SL SUBL: 0.4000 mg | SUBLINGUAL_TABLET | SUBLINGUAL | Status: DC | PRN

## 2013-10-06 MED ORDER — DEXTROSE 5 % IV SOLN
500.0000 mg | Freq: Once | INTRAVENOUS | Status: DC
  Administered 2013-10-06: 500 mg via INTRAVENOUS

## 2013-10-06 MED ORDER — ONDANSETRON HCL 4 MG PO TABS: 4.0000 mg | ORAL_TABLET | Freq: Four times a day (QID) | ORAL | Status: DC | PRN

## 2013-10-06 MED ORDER — HYDROCODONE-ACETAMINOPHEN 10-325 MG PO TABS: 1.0000 | ORAL_TABLET | Freq: Four times a day (QID) | ORAL | Status: DC | PRN

## 2013-10-06 MED ORDER — SODIUM CHLORIDE 0.9 % IJ SOLN
3.0000 mL | Freq: Two times a day (BID) | INTRAMUSCULAR | Status: DC
  Administered 2013-10-06 – 2013-10-10 (×4): 3 mL via INTRAVENOUS

## 2013-10-06 MED ORDER — METOPROLOL SUCCINATE ER 50 MG PO TB24
50.0000 mg | ORAL_TABLET | Freq: Every day | ORAL | Status: DC
  Administered 2013-10-07 – 2013-10-10 (×4): 50 mg via ORAL
  Filled 2013-10-06 (×4): qty 1

## 2013-10-06 MED ORDER — ALBUTEROL SULFATE (2.5 MG/3ML) 0.083% IN NEBU
5.0000 mg | INHALATION_SOLUTION | Freq: Four times a day (QID) | RESPIRATORY_TRACT | Status: AC
  Administered 2013-10-06 – 2013-10-07 (×2): 5 mg via RESPIRATORY_TRACT
  Filled 2013-10-06 (×2): qty 6

## 2013-10-06 MED ORDER — DONEPEZIL HCL 10 MG PO TABS
10.0000 mg | ORAL_TABLET | Freq: Every day | ORAL | Status: DC
  Administered 2013-10-06 – 2013-10-09 (×4): 10 mg via ORAL
  Filled 2013-10-06 (×5): qty 1

## 2013-10-06 MED ORDER — CLOPIDOGREL BISULFATE 75 MG PO TABS
75.0000 mg | ORAL_TABLET | Freq: Every day | ORAL | Status: DC
  Administered 2013-10-07 – 2013-10-10 (×4): 75 mg via ORAL
  Filled 2013-10-06 (×5): qty 1

## 2013-10-06 MED ORDER — ATORVASTATIN CALCIUM 40 MG PO TABS
40.0000 mg | ORAL_TABLET | Freq: Every day | ORAL | Status: DC
  Administered 2013-10-06 – 2013-10-10 (×5): 40 mg via ORAL
  Filled 2013-10-06 (×5): qty 1

## 2013-10-06 MED ORDER — POLYETHYLENE GLYCOL 3350 17 G PO PACK
17.0000 g | PACK | Freq: Every day | ORAL | Status: DC | PRN
  Filled 2013-10-06: qty 1

## 2013-10-06 MED ORDER — ASPIRIN EC 81 MG PO TBEC
81.0000 mg | DELAYED_RELEASE_TABLET | Freq: Every day | ORAL | Status: DC
  Administered 2013-10-06 – 2013-10-10 (×5): 81 mg via ORAL
  Filled 2013-10-06 (×5): qty 1

## 2013-10-06 MED ORDER — SODIUM CHLORIDE 0.9 % IV SOLN
INTRAVENOUS | Status: DC
  Administered 2013-10-06 – 2013-10-07 (×2): via INTRAVENOUS

## 2013-10-06 MED ORDER — HYDROCHLOROTHIAZIDE 10 MG/ML ORAL SUSPENSION
6.2500 mg | Freq: Every day | ORAL | Status: DC
  Administered 2013-10-07 – 2013-10-10 (×4): 6.25 mg via ORAL
  Filled 2013-10-06 (×4): qty 1.25

## 2013-10-06 MED ORDER — PANTOPRAZOLE SODIUM 40 MG PO TBEC
40.0000 mg | DELAYED_RELEASE_TABLET | Freq: Every day | ORAL | Status: DC
  Administered 2013-10-07 – 2013-10-10 (×4): 40 mg via ORAL
  Filled 2013-10-06 (×5): qty 1

## 2013-10-06 MED ORDER — HYDROCHLOROTHIAZIDE 25 MG PO TABS: 6.2500 mg | ORAL_TABLET | Freq: Every day | ORAL | Status: DC

## 2013-10-06 MED ORDER — DEXTROSE 5 % IV SOLN
1.0000 g | INTRAVENOUS | Status: DC
  Administered 2013-10-07 – 2013-10-09 (×3): 1 g via INTRAVENOUS
  Filled 2013-10-06 (×3): qty 10

## 2013-10-06 MED ORDER — DEXTROSE 5 % IV SOLN
500.0000 mg | INTRAVENOUS | Status: DC
  Administered 2013-10-07: 500 mg via INTRAVENOUS
  Filled 2013-10-06 (×2): qty 500

## 2013-10-06 MED ORDER — DEXTROSE 5 % IV SOLN
1.0000 g | INTRAVENOUS | Status: DC
  Administered 2013-10-06: 1 g via INTRAVENOUS
  Filled 2013-10-06: qty 10

## 2013-10-06 MED ORDER — ALBUTEROL SULFATE (2.5 MG/3ML) 0.083% IN NEBU: 5.0000 mg | INHALATION_SOLUTION | RESPIRATORY_TRACT | Status: DC | PRN

## 2013-10-06 MED ORDER — ONDANSETRON HCL 4 MG/2ML IJ SOLN: 4.0000 mg | Freq: Four times a day (QID) | INTRAMUSCULAR | Status: DC | PRN

## 2013-10-06 MED ORDER — ACETAMINOPHEN 650 MG RE SUPP
650.0000 mg | Freq: Four times a day (QID) | RECTAL | Status: DC | PRN
Start: 2013-10-06 — End: 2013-10-10

## 2013-10-06 MED ORDER — IPRATROPIUM BROMIDE 0.02 % IN SOLN
0.5000 mg | Freq: Four times a day (QID) | RESPIRATORY_TRACT | Status: DC
  Administered 2013-10-06 – 2013-10-08 (×8): 0.5 mg via RESPIRATORY_TRACT
  Filled 2013-10-06 (×9): qty 2.5

## 2013-10-06 MED ORDER — ALBUTEROL SULFATE (2.5 MG/3ML) 0.083% IN NEBU
5.0000 mg | INHALATION_SOLUTION | Freq: Once | RESPIRATORY_TRACT | Status: AC
  Administered 2013-10-06: 5 mg via RESPIRATORY_TRACT
  Filled 2013-10-06: qty 6

## 2013-10-06 MED ORDER — BUDESONIDE-FORMOTEROL FUMARATE 160-4.5 MCG/ACT IN AERO
2.0000 | INHALATION_SPRAY | Freq: Two times a day (BID) | RESPIRATORY_TRACT | Status: DC
  Administered 2013-10-07: 2 via RESPIRATORY_TRACT
  Filled 2013-10-06 (×2): qty 6

## 2013-10-06 MED ORDER — ACETAMINOPHEN 325 MG PO TABS
650.0000 mg | ORAL_TABLET | Freq: Four times a day (QID) | ORAL | Status: DC | PRN
Start: 2013-10-06 — End: 2013-10-10

## 2013-10-06 MED ORDER — ESCITALOPRAM OXALATE 20 MG PO TABS
20.0000 mg | ORAL_TABLET | Freq: Every day | ORAL | Status: DC
  Administered 2013-10-07 – 2013-10-10 (×4): 20 mg via ORAL
  Filled 2013-10-06 (×4): qty 1

## 2013-10-06 MED ORDER — CILOSTAZOL 50 MG PO TABS
50.0000 mg | ORAL_TABLET | Freq: Two times a day (BID) | ORAL | Status: DC
  Administered 2013-10-06 – 2013-10-10 (×8): 50 mg via ORAL
  Filled 2013-10-06 (×9): qty 1

## 2013-10-06 MED ORDER — ALPRAZOLAM 0.25 MG PO TABS
0.2500 mg | ORAL_TABLET | Freq: Two times a day (BID) | ORAL | Status: DC
  Administered 2013-10-06 – 2013-10-10 (×8): 0.25 mg via ORAL
  Filled 2013-10-06 (×8): qty 1

## 2013-10-06 MED ORDER — ENOXAPARIN SODIUM 40 MG/0.4ML ~~LOC~~ SOLN
40.0000 mg | SUBCUTANEOUS | Status: DC
  Administered 2013-10-06 – 2013-10-09 (×4): 40 mg via SUBCUTANEOUS
  Filled 2013-10-06 (×5): qty 0.4

## 2013-10-06 MED ORDER — ENOXAPARIN SODIUM 40 MG/0.4ML ~~LOC~~ SOLN: 40.0000 mg | SUBCUTANEOUS | Status: DC

## 2013-10-06 MED ORDER — METHYLPREDNISOLONE SODIUM SUCC 125 MG IJ SOLR
80.0000 mg | Freq: Three times a day (TID) | INTRAMUSCULAR | Status: DC
  Administered 2013-10-06 – 2013-10-07 (×2): 80 mg via INTRAVENOUS
  Filled 2013-10-06 (×2): qty 1.28
  Filled 2013-10-06: qty 2
  Filled 2013-10-06 (×3): qty 1.28

## 2013-10-06 NOTE — ED Notes (Signed)
NOTIFIED L. SANDERS OF CG4+ LACTIC ACID ,@13 :00 PM ,10/06/2013.

## 2013-10-06 NOTE — ED Provider Notes (Signed)
CSN: [id number]     Arrival date & time 10/06/13  1158 History   First MD Initiated Contact with Patient 10/06/13 1210     Chief Complaint  Patient presents with  . Shortness of Breath     (Consider location/radiation/quality/duration/timing/severity/associated sxs/prior Treatment) The history is provided by the patient and medical records.   This is a 80 year old female with past medical history significant for hypertension, hyperlipidemia, COPD, coronary artery disease, dementia, presenting to the ED for shortness of breath. Patient was seen by her primary care physician approximately one week ago for an ongoing "cold" for the past several weeks.  She endorses productive cough with green sputum, nasal congestion, and increased shortness of breath from her baseline. Patient is on 3 L via nasal cannula at all times.  Patient was started on Levaquin and prednisone which she has been taking without significant improvement. She denies any fever or chills. No chest pain, palpitations, dizziness, lightheadedness, or feeling of syncope.  Pt given 125 solumedrol by EMS.  VS stable on arrival, respirations labored.  Past Medical History  Diagnosis Date  . Hyperlipidemia   . Hypertension   . Depression   . Osteoarthritis   . Allergic rhinitis   . Raynaud disease   . Adenomatous polyp 03/2004  . Diverticulosis   . Syncope Jan 2013    NSVT noted on event monitor  . COPD (chronic obstructive pulmonary disease) with emphysema   . Pulmonary nodule, right x2  middle and lower lobes--  stable per follow-ip ct  jan 2012  . S/P primary angioplasty with coronary stent   . History of acute inferior wall myocardial infarction SEPT 2008      S/P STENTING PROXIMAL TO MID RCA  . Endometrial hyperplasia   . History of breast cancer 2011--  ONCOLOGIST- DR Mariea Clonts    S/P LEFT PARTIAL MASTECTOMY AND RADIATION---  NO RECURRENCE  . Dyspnea   . Anxiety   . CAD (coronary artery disease) CARDIOLOGIST-- DR Cristopher Peru--- LAST VISIT 08-16-2011  NOTE IN EPIC    Remote inferior MI in 2008. Extensive stenting of the RCA, s/p repeat stenting of the RCA in Dec 2010 due to restenosis  . Dry eyes, bilateral   . Heart attack   . Carotid stenosis, asymptomatic BILATERAL ICA --  0-39%  PER  DUPLEX 05-22-2011  . Clotting disorder   . Closed dislocation of right ankle, with complex fracture 03/16/2012  . Cancer     breast (lumpectomy on left breast 2 years ago)   Past Surgical History  Procedure Laterality Date  . Tympanostomy tube placement    . Colonoscopy  JULY 2013  . US echocardiography  05-22-2011    LVSF NORMAL/ EF 60-65%  . Left partial mastectomy w/ bx axillary lymph node   09-04-2009    INVASIVE MAMMARY CARCINOMA/  STAGE T1b NO  . Coronary angioplasty with stent placement  04-19-2009  DR PETER Martinique    SINGLE VESSEL OBSTRUCTIVE DISEASE/  STENTING OF IN-STENT RESTENOSIS MID RCA/ NORMAL LVF  . Coronary angioplasty with stent placement  SEPT 2008--  [place]    STENTING PROXIMAL TO MID RCA  . Cardiovascular stress test  05-28-2011    LOW RISK STUDY  . Bilateral breast lumpectomy  BEFORE 2008  . Dilation and curettage of uterus  10/10/11  . Cataract extraction w/ intraocular lens  implant, bilateral    . Intrauterine device insertion    . Orif ankle fracture  03/16/2012  RIGHT ANKLE  . Orif ankle fracture  03/16/2012    Procedure: OPEN REDUCTION INTERNAL FIXATION (ORIF) ANKLE FRACTURE;  Surgeon: Johnny Bridge, MD;  Location: [place];  Service: Orthopedics;  Laterality: Right;   Family History  Problem Relation Age of Onset  . Coronary artery disease Father     & Mother  . Heart attack Father   . Gout Father   . Osteoarthritis Mother   . Heart disease Mother     congestive heart failure  . Hypertension Sister   . Heart disease Sister   . Cancer Sister     breast  . Breast cancer      Aunts  . Uterine cancer Sister   . Colon cancer Neg Hx    History  Substance Use  Topics  . Smoking status: Former Smoker -- 2.00 packs/day for 50 years    Types: Cigarettes    Quit date: 01/11/2007  . Smokeless tobacco: Never Used  . Alcohol Use: 3.6 oz/week    6 Glasses of wine per week     Comment: OCCASSIONAL   OB History   Grav Para Term Preterm Abortions TAB SAB Ect Mult Living   1 1 1       1      Review of Systems  HENT: Positive for congestion.   Respiratory: Positive for cough and shortness of breath.   All other systems reviewed and are negative.     Allergies  Codeine; Penicillins; and Shellfish allergy  Home Medications   Prior to Admission medications   Medication Sig Start Date End Date Taking? Authorizing Provider  acetaminophen (TYLENOL) 500 MG tablet Take 1,000 mg by mouth 2 (two) times daily as needed for mild pain or fever.   Yes Historical Provider, MD  albuterol (VENTOLIN HFA) 108 (90 BASE) MCG/ACT inhaler Inhale 2 puffs into the lungs every 6 (six) hours as needed for shortness of breath.    Yes Historical Provider, MD  ALPRAZolam (XANAX) 0.25 MG tablet Take 0.25 mg by mouth 2 (two) times daily.   Yes Historical Provider, MD  aspirin EC 81 MG tablet Take 81 mg by mouth daily.   Yes Historical Provider, MD  atorvastatin (LIPITOR) 40 MG tablet Take 1 tablet (40 mg total) by mouth daily. 06/11/11  Yes Peter M Martinique, MD  budesonide-formoterol [facility]) 160-4.5 MCG/ACT inhaler Inhale 2 puffs into the lungs 2 (two) times daily.   Yes Historical Provider, MD  Calcium Carbonate (OS-CAL PO) Take 500 mg by mouth daily.   Yes Historical Provider, MD  cilostazol (PLETAL) 50 MG tablet Take 50 mg by mouth 2 (two) times daily.    Yes Historical Provider, MD  clopidogrel (PLAVIX) 75 MG tablet Take 75 mg by mouth daily with breakfast.   Yes Historical Provider, MD  docusate sodium (COLACE) 100 MG capsule Take 100 mg by mouth 2 (two) times daily as needed for mild constipation.   Yes Historical Provider, MD  donepezil (ARICEPT) 10 MG tablet Take 10 mg  by mouth at bedtime.   Yes Historical Provider, MD  escitalopram (LEXAPRO) 20 MG tablet Take 20 mg by mouth daily.   Yes Historical Provider, MD  hydrochlorothiazide (HYDRODIURIL) 25 MG tablet Take 6.25 mg by mouth daily.   Yes Historical Provider, MD  HYDROcodone-acetaminophen (NORCO) 10-325 MG per tablet Take 1-2 tablets by mouth every 6 (six) hours as needed for severe pain.   Yes Historical Provider, MD  losartan (COZAAR) 50 MG tablet Take 50 mg by mouth  2 (two) times daily.   Yes Historical Provider, MD  metoprolol succinate (TOPROL-XL) 50 MG 24 hr tablet Take 50 mg by mouth daily. Take with or immediately following a meal.   Yes Historical Provider, MD  nitroGLYCERIN (NITROSTAT) 0.4 MG SL tablet Place 0.4 mg under the tongue every 5 (five) minutes as needed for chest pain.    Yes Historical Provider, MD  Omega-3 Fatty Acids (FISH OIL) 1200 MG CAPS Take 1 capsule by mouth daily.   Yes Historical Provider, MD  omeprazole (PRILOSEC) 40 MG capsule Take 40 mg by mouth daily.   Yes Historical Provider, MD  polyethylene glycol (MIRALAX / GLYCOLAX) packet Take 17 g by mouth daily as needed for mild constipation.   Yes Historical Provider, MD  promethazine (PHENERGAN) 25 MG tablet Take 25 mg by mouth 2 (two) times daily as needed for nausea or vomiting.   Yes Historical Provider, MD  senna (SENOKOT) 8.6 MG TABS tablet Take 1 tablet by mouth daily as needed for mild constipation.   Yes Historical Provider, MD  tamsulosin (FLOMAX) 0.4 MG CAPS capsule Take 0.4 mg by mouth daily.   Yes Historical Provider, MD  tiotropium (SPIRIVA) 18 MCG inhalation capsule Place 18 mcg into inhaler and inhale every morning.    Yes Historical Provider, MD   BP 103/41  Pulse 68  Temp(Src) 98 F (36.7 C) (Oral)  Resp 11  Ht 5' (1.524 m)  SpO2 97%  Physical Exam  Nursing note and vitals reviewed. Constitutional: She is oriented to person, place, and time. She appears well-developed and well-nourished. No distress.  Nasal cannula in place.  3L via [place]  HENT:  Head: Normocephalic and atraumatic.  Mouth/Throat: Oropharynx is clear and moist.  Eyes: Conjunctivae and EOM are normal. Pupils are equal, round, and reactive to light.  Neck: Normal range of motion. Neck supple.  Cardiovascular: Normal rate, regular rhythm and normal heart sounds.   Pulmonary/Chest: No respiratory distress. She has wheezes.  Respirations mildly labored, diffuse inspiratory and expiratory wheezes throughout, speaking in short truncated sentences; productive cough noted on exam  Abdominal: Soft. Bowel sounds are normal. There is no tenderness. There is no guarding.  Musculoskeletal: Normal range of motion. She exhibits no edema.  Neurological: She is alert and oriented to person, place, and time.  Skin: Skin is warm and dry. She is not diaphoretic.  Psychiatric: She has a normal mood and affect.    ED Course  Procedures (including critical care time) Labs Review Labs Reviewed  CBC WITH DIFFERENTIAL - Abnormal; Notable for the following:    WBC 11.0 (*)    RDW 15.8 (*)    Neutro Abs 8.4 (*)    All other components within normal limits  BASIC METABOLIC PANEL - Abnormal; Notable for the following:    Potassium 3.3 (*)    Glucose, Bld 170 (*)    GFR calc non Af Amer 59 (*)    GFR calc Af Amer 69 (*)    All other components within normal limits  I-STAT TROPOININ, ED  I-STAT CG4 LACTIC ACID, ED    Imaging Review Dg Chest 2 View  10/06/2013   CLINICAL DATA:  Cough.  Shortness of breath.  EXAM: CHEST  2 VIEW  COMPARISON:  Chest CT 03/23/2013.  FINDINGS: Mediastinum and hilar structures are normal. Subsegmental atelectasis in the lung bases. Mild pneumonia cannot be excluded. Mild cardiomegaly. No pleural effusion or pneumothorax. Surgical clips left chest. No acute bony abnormality.  IMPRESSION: 1. Mild  bibasilar atelectasis and/or infiltrates. 2. Mild cardiomegaly. 3. Surgical clips left chest.   Electronically Signed   By:  Marcello Moores  Register   On: 10/06/2013 13:35     EKG Interpretation None      MDM   Final diagnoses:  CAP (community acquired pneumonia)  COPD (chronic obstructive pulmonary disease)  On home oxygen therapy  HTN (hypertension)   80 year old female currently being treated for pneumonia with Levaquin and steroids, history of COPD, presenting to the ED with a productive cough and shortness of breath. On arrival she is afebrile but her respirations do appear somewhat labored.  She is on 3L via [place] at all times.  EKG sinus rhythm without ischemic change.  Will obtain labs including cbc, bmp, troponin, lactic acid, and CXR.  Pt has already had 125mg  solumedrol en route.  Neb tx given.  Will monitor closely.  After neb treatment, bleeding has slowed the patient appears more comfortable.  Lives with leukocytosis of 11.0. Lactic acid within normal limits.  Chest x-ray with likely bibasilar infiltrates. Patient has essentially failed outpatient treatment will need hospital admission. She was started on Rocephin and azithromycin in the ED. Case was discussed with Dr. Brigitte Pulse from [facility] who will admit.  Temp admission orders placed.  VS remain stable at this time.  Larene Pickett, PA-C 10/06/13 1511

## 2013-10-06 NOTE — Progress Notes (Signed)
Pt admitted to room 3e03.  Pt alert and oriented to self only and incontinent of bowel and bladder.  Pt VS wnl, placed on tele, and attending MD paged to notify about pt's arrival.  Will continue to monitor.

## 2013-10-06 NOTE — ED Notes (Signed)
Respiratory Therapist at the bedside.

## 2013-10-06 NOTE — ED Notes (Signed)
NOTIFIED L. SANDERS ,PAC OF PATIENRS LAB RESULTS OF I-STAT TROPONIN ,13:05 PM ,10/06/2013.

## 2013-10-06 NOTE — ED Notes (Signed)
Per EMS, Patient came in from [facility]. Patient has been diagnosed with a cold about a week and a half ago. Patient's symptoms have gotten worse since then time. Patient has a congested cough with green sputum present. Vitals per EMS: 116/72, 64 HR, 95 % on 3 L. Patient has a history of COPD. When EMS arrived, Patient had labored breathing while sitting in the wheelchair. Patient was given 125 mg of Solumedrol IV in transport.

## 2013-10-06 NOTE — Care Management Note (Addendum)
  Page 1 of 1   10/10/2013     12:17:50 PM CARE MANAGEMENT NOTE 10/10/2013  Patient:  Melinda Mora, Melinda Mora   Account Number:  [id number]  Date Initiated:  10/06/2013  Documentation initiated by:  Sabrina Arriaga  Subjective/Objective Assessment:   Admitted with failed OP tx for Pneumonia     Action/Plan:   CM to follow for disposition needs   Anticipated DC Date:  10/09/2013   Anticipated DC Plan:  SKILLED NURSING FACILITY         Choice offered to / List presented to:             Status of service:  Completed, signed off Medicare Important Message given?  YES (If response is "NO", the following Medicare IM given date fields will be blank) Date Medicare IM given:  10/06/2013 Date Additional Medicare IM given:    Discharge Disposition:  [place]  Per UR Regulation:  Reviewed for med. necessity/level of care/duration of stay  If discussed at [place] of Stay Meetings, dates discussed:    Comments:  Bret Vanessen RN, BSN, MSHL, CCM  Nurse - Case Manager, ([facility] [place])  [phone]  10/06/2013 SW active with case Diposition Plan:  SNF  CLAPPS' Morrison RN, BSN, MSHL, CCM  Nurse - Case Manager, (Unit [place])  [phone]  10/06/2013 Admitted with failed OP tx for Pneumonia Disposition Plan: pending.

## 2013-10-06 NOTE — H&P (Signed)
PCP:   Precious Reel, MD   Chief Complaint:  Shortness of breath  HPI: Melinda Mora is a 80 year old white female with a history of COPD, coronary artery disease status post PCI who presents to the emergency department with complaint of shortness of breath. Patient states that she's been having issues with the past 2 weeks. She saw her nurse practitioner on 5/18 for increased shortness of breath, cough with purulent sputum production, and wheezing. She was treated with oral Levaquin and a prednisone taper which she completed 3 days ago. She has not feel like she had significant improvement while on this treatment. Since discontinuing her treatment she has become more lethargic with decreased by mouth intake and increased weakness. She also reports increasing shortness of breath which prompted her to come to the emergency department where her oxygen saturation is are in the low 90s on her home 3 L of oxygen. Chest x-ray shows possible bilateral pneumonia. She was treated with IV Solu-Medrol and albuterol nebulizer with significant improvement in her symptoms. However, given her failed outpatient treatment she was admitted for further management.  Review of Systems:  Review of Systems - All systems reviewed with patient and are negative as in history of present illness with following exceptions: She describes bilateral foot pain involving her toes and feet. Some numbness. Past Medical History: Past Medical History  Diagnosis Date  . Hyperlipidemia   . Hypertension   . Depression   . Osteoarthritis   . Allergic rhinitis   . Raynaud disease   . Adenomatous polyp 03/2004  . Diverticulosis   . Syncope Jan 2013    NSVT noted on event monitor  . COPD (chronic obstructive pulmonary disease) with emphysema   . Pulmonary nodule, right x2  middle and lower lobes--  stable per follow-ip ct  jan 2012  . S/P primary angioplasty with coronary stent   . History of acute inferior wall myocardial infarction  SEPT 2008      S/P STENTING PROXIMAL TO MID RCA  . Endometrial hyperplasia   . History of breast cancer 2011--  ONCOLOGIST- DR Mariea Clonts    S/P LEFT PARTIAL MASTECTOMY AND RADIATION---  NO RECURRENCE  . Dyspnea   . Anxiety   . CAD (coronary artery disease) CARDIOLOGIST-- DR Cristopher Peru--- LAST VISIT 08-16-2011  NOTE IN EPIC    Remote inferior MI in 2008. Extensive stenting of the RCA, s/p repeat stenting of the RCA in Dec 2010 due to restenosis  . Dry eyes, bilateral   . Heart attack   . Carotid stenosis, asymptomatic BILATERAL ICA --  0-39%  PER  DUPLEX 05-22-2011  . Clotting disorder   . Closed dislocation of right ankle, with complex fracture 03/16/2012  . Cancer     breast (lumpectomy on left breast 2 years ago)   Past Surgical History  Procedure Laterality Date  . Tympanostomy tube placement    . Colonoscopy  JULY 2013  . US echocardiography  05-22-2011    LVSF NORMAL/ EF 60-65%  . Left partial mastectomy w/ bx axillary lymph node   09-04-2009    INVASIVE MAMMARY CARCINOMA/  STAGE T1b NO  . Coronary angioplasty with stent placement  04-19-2009  DR PETER Martinique    SINGLE VESSEL OBSTRUCTIVE DISEASE/  STENTING OF IN-STENT RESTENOSIS MID RCA/ NORMAL LVF  . Coronary angioplasty with stent placement  SEPT 2008--  [facility]    STENTING PROXIMAL TO MID RCA  . Cardiovascular stress test  05-28-2011  LOW RISK STUDY  . Bilateral breast lumpectomy  BEFORE 2008  . Dilation and curettage of uterus  10/10/11  . Cataract extraction w/ intraocular lens  implant, bilateral    . Intrauterine device insertion    . Orif ankle fracture  03/16/2012    RIGHT ANKLE  . Orif ankle fracture  03/16/2012    Procedure: OPEN REDUCTION INTERNAL FIXATION (ORIF) ANKLE FRACTURE;  Surgeon: Johnny Bridge, MD;  Location: [place];  Service: Orthopedics;  Laterality: Right;    Medications: Prior to Admission medications   Medication Sig Start Date End Date Taking? Authorizing Provider   acetaminophen (TYLENOL) 500 MG tablet Take 1,000 mg by mouth 2 (two) times daily as needed for mild pain or fever.   Yes Historical Provider, MD  albuterol (VENTOLIN HFA) 108 (90 BASE) MCG/ACT inhaler Inhale 2 puffs into the lungs every 6 (six) hours as needed for shortness of breath.    Yes Historical Provider, MD  ALPRAZolam (XANAX) 0.25 MG tablet Take 0.25 mg by mouth 2 (two) times daily.   Yes Historical Provider, MD  aspirin EC 81 MG tablet Take 81 mg by mouth daily.   Yes Historical Provider, MD  atorvastatin (LIPITOR) 40 MG tablet Take 1 tablet (40 mg total) by mouth daily. 06/11/11  Yes Peter M Martinique, MD  budesonide-formoterol [facility]) 160-4.5 MCG/ACT inhaler Inhale 2 puffs into the lungs 2 (two) times daily.   Yes Historical Provider, MD  Calcium Carbonate (OS-CAL PO) Take 500 mg by mouth daily.   Yes Historical Provider, MD  cilostazol (PLETAL) 50 MG tablet Take 50 mg by mouth 2 (two) times daily.    Yes Historical Provider, MD  clopidogrel (PLAVIX) 75 MG tablet Take 75 mg by mouth daily with breakfast.   Yes Historical Provider, MD  docusate sodium (COLACE) 100 MG capsule Take 100 mg by mouth 2 (two) times daily as needed for mild constipation.   Yes Historical Provider, MD  donepezil (ARICEPT) 10 MG tablet Take 10 mg by mouth at bedtime.   Yes Historical Provider, MD  escitalopram (LEXAPRO) 20 MG tablet Take 20 mg by mouth daily.   Yes Historical Provider, MD  hydrochlorothiazide (HYDRODIURIL) 25 MG tablet Take 6.25 mg by mouth daily.   Yes Historical Provider, MD  HYDROcodone-acetaminophen (NORCO) 10-325 MG per tablet Take 1-2 tablets by mouth every 6 (six) hours as needed for severe pain.   Yes Historical Provider, MD  losartan (COZAAR) 50 MG tablet Take 50 mg by mouth 2 (two) times daily.   Yes Historical Provider, MD  metoprolol succinate (TOPROL-XL) 50 MG 24 hr tablet Take 50 mg by mouth daily. Take with or immediately following a meal.   Yes Historical Provider, MD   nitroGLYCERIN (NITROSTAT) 0.4 MG SL tablet Place 0.4 mg under the tongue every 5 (five) minutes as needed for chest pain.    Yes Historical Provider, MD  Omega-3 Fatty Acids (FISH OIL) 1200 MG CAPS Take 1 capsule by mouth daily.   Yes Historical Provider, MD  omeprazole (PRILOSEC) 40 MG capsule Take 40 mg by mouth daily.   Yes Historical Provider, MD  polyethylene glycol (MIRALAX / GLYCOLAX) packet Take 17 g by mouth daily as needed for mild constipation.   Yes Historical Provider, MD  promethazine (PHENERGAN) 25 MG tablet Take 25 mg by mouth 2 (two) times daily as needed for nausea or vomiting.   Yes Historical Provider, MD  senna (SENOKOT) 8.6 MG TABS tablet Take 1 tablet by mouth daily as needed  for mild constipation.   Yes Historical Provider, MD  tamsulosin (FLOMAX) 0.4 MG CAPS capsule Take 0.4 mg by mouth daily.   Yes Historical Provider, MD  tiotropium (SPIRIVA) 18 MCG inhalation capsule Place 18 mcg into inhaler and inhale every morning.    Yes Historical Provider, MD    Allergies:   Allergies  Allergen Reactions  . Codeine Hives       . Penicillins Hives  . Shellfish Allergy Other (See Comments)    Absent reflexes    Social History:  reports that she quit smoking about 6 years ago. Her smoking use included Cigarettes. She has a 100 pack-year smoking history. She has never used smokeless tobacco. She reports that she drinks about 3.6 ounces of alcohol per week. She reports that she does not use illicit drugs.  Family History: Family History  Problem Relation Age of Onset  . Coronary artery disease Father     & Mother  . Heart attack Father   . Gout Father   . Osteoarthritis Mother   . Heart disease Mother     congestive heart failure  . Hypertension Sister   . Heart disease Sister   . Cancer Sister     breast  . Breast cancer      Aunts  . Uterine cancer Sister   . Colon cancer Neg Hx     Physical Exam: Filed Vitals:   10/06/13 1430 10/06/13 1445 10/06/13 1515  10/06/13 1705  BP: 112/53 96/53 119/54 105/55  Pulse: 72 71 73 80  Temp:    97.3 F (36.3 C)  TempSrc:    Oral  Resp: 19 18 23 20   Height:    5' (1.524 m)  Weight:    73.936 kg (163 lb)  SpO2: 92% 93% 94% 95%   General appearance: alert and Chronically ill ill Head: Normocephalic, without obvious abnormality, atraumatic Eyes: conjunctivae/corneas clear. PERRL, EOM's intact.  Nose: Nares normal. Septum midline. Mucosa normal. No drainage or sinus tenderness. Throat: lips, mucosa, and tongue normal; teeth and gums normal Neck: no adenopathy, no carotid bruit, no JVD and thyroid not enlarged, symmetric, no tenderness/mass/nodules Resp: Decreased breath sounds bilaterally with expiratory wheezing in the bilateral bases Cardio: regular rate and rhythm GI: soft, non-tender; bowel sounds normal; no masses,  no organomegaly Extremities: Arthritic toes with atrophy  Pulses: 2+ and symmetric Lymph nodes:no cervical lymphadenopathy Neurologic: Alert and oriented X 3, normal strength and tone. Normal symmetric reflexes.     Labs on Admission:   Recent Labs  10/06/13 1216  NA 141  K 3.3*  CL 104  CO2 26  GLUCOSE 170*  BUN 18  CREATININE 0.91  CALCIUM 9.2   No results found for this basename: AST, ALT, ALKPHOS, BILITOT, PROT, ALBUMIN,  in the last 72 hours No results found for this basename: LIPASE, AMYLASE,  in the last 72 hours  Recent Labs  10/06/13 1216  WBC 11.0*  NEUTROABS 8.4*  HGB 13.1  HCT 40.7  MCV 91.5  PLT 210   No results found for this basename: CKTOTAL, CKMB, CKMBINDEX, TROPONINI,  in the last 72 hours No results found for this basename: INR, PROTIME   No results found for this basename: TSH, T4TOTAL, FREET3, T3FREE, THYROIDAB,  in the last 72 hours No results found for this basename: VITAMINB12, FOLATE, FERRITIN, TIBC, IRON, RETICCTPCT,  in the last 72 hours  Radiological Exams on Admission: Dg Chest 2 View  10/06/2013   CLINICAL DATA:  Cough.  Shortness of breath.  EXAM: CHEST  2 VIEW  COMPARISON:  Chest CT 03/23/2013.  FINDINGS: Mediastinum and hilar structures are normal. Subsegmental atelectasis in the lung bases. Mild pneumonia cannot be excluded. Mild cardiomegaly. No pleural effusion or pneumothorax. Surgical clips left chest. No acute bony abnormality.  IMPRESSION: 1. Mild bibasilar atelectasis and/or infiltrates. 2. Mild cardiomegaly. 3. Surgical clips left chest.   Electronically Signed   By: Marcello Moores  Register   On: 10/06/2013 13:35   Orders placed during the hospital encounter of 10/06/13  . EKG 12-LEAD-sinus rhythm with PVCs     Assessment/Plan Principal Problem: 1. COPD exacerbation- admit for management with IV Solu-Medrol, scheduled bronchodilators, and IV antibiotics due to failed outpatient treatment. Anticipate we can transition to oral steroids next one to 2 days.  Consider transition from [place] to a nebulizer based treatment.   Active Problems: 2. CAP (community acquired pneumonia)-we'll treat with Rocephin/azithromycin while inpatient and consider alternatives once stable. 3. HYPERTENSION- continue home medications. 4. CAD (coronary artery disease)- continue medical therapy. No anginal symptoms to warrant further evaluation for cardiac etiology of her dyspnea. 5. Disposition-anticipate discharge back to [facility] independent living in the next 2-3 days.  Melinda Mora 10/06/2013, 7:14 PM

## 2013-10-06 NOTE — ED Notes (Signed)
Patient returned from [place]. Placed back on monitor.

## 2013-10-06 NOTE — Plan of Care (Signed)
Problem: Phase I Progression Outcomes Goal: OOB as tolerated unless otherwise ordered Outcome: Not Applicable Date Met:  72/55/00 Patient is total care

## 2013-10-06 NOTE — Progress Notes (Signed)
UR completed Melinda Mora. Naseem Adler, RN, BSN, [place], CCM  10/06/2013 5:33 PM

## 2013-10-07 LAB — BASIC METABOLIC PANEL
BUN: 16 mg/dL (ref 6–23)
CO2: 23 mEq/L (ref 19–32)
Calcium: 8.9 mg/dL (ref 8.4–10.5)
Chloride: 104 mEq/L (ref 96–112)
Creatinine, Ser: 0.74 mg/dL (ref 0.50–1.10)
GFR calc Af Amer: >90 mL/min (ref >90)
GFR calc non Af Amer: 80 mL/min — ABNORMAL LOW (ref >90)
Glucose, Bld: 180 mg/dL — ABNORMAL HIGH (ref 70–99)
Potassium: 3.5 mEq/L — ABNORMAL LOW (ref 3.7–5.3)
Sodium: 140 mEq/L (ref 137–147)

## 2013-10-07 LAB — CBC
HCT: 37.6 % (ref 36.0–46.0)
Hemoglobin: 12 g/dL (ref 12.0–15.0)
MCH: 28.8 pg (ref 26.0–34.0)
MCHC: 31.9 g/dL (ref 30.0–36.0)
MCV: 90.2 fL (ref 78.0–100.0)
Platelets: 212 10*3/uL (ref 150–400)
RBC: 4.17 MIL/uL (ref 3.87–5.11)
RDW: 15.6 % — ABNORMAL HIGH (ref 11.5–15.5)
WBC: 10.7 10*3/uL — ABNORMAL HIGH (ref 4.0–10.5)

## 2013-10-07 MED ORDER — PREDNISONE 50 MG PO TABS
60.0000 mg | ORAL_TABLET | Freq: Every day | ORAL | Status: DC
  Administered 2013-10-07 – 2013-10-09 (×3): 60 mg via ORAL
  Filled 2013-10-07 (×4): qty 1

## 2013-10-07 MED ORDER — GUAIFENESIN ER 600 MG PO TB12
600.0000 mg | ORAL_TABLET | Freq: Two times a day (BID) | ORAL | Status: DC | PRN
  Filled 2013-10-07: qty 1

## 2013-10-07 MED ORDER — POTASSIUM CHLORIDE CRYS ER 20 MEQ PO TBCR
20.0000 meq | EXTENDED_RELEASE_TABLET | Freq: Once | ORAL | Status: AC
  Administered 2013-10-07: 20 meq via ORAL
  Filled 2013-10-07: qty 1

## 2013-10-07 MED ORDER — POTASSIUM CHLORIDE CRYS ER 20 MEQ PO TBCR
40.0000 meq | EXTENDED_RELEASE_TABLET | Freq: Once | ORAL | Status: AC
  Administered 2013-10-07: 40 meq via ORAL
  Filled 2013-10-07: qty 2

## 2013-10-07 MED ORDER — BUDESONIDE-FORMOTEROL FUMARATE 160-4.5 MCG/ACT IN AERO
2.0000 | INHALATION_SPRAY | Freq: Two times a day (BID) | RESPIRATORY_TRACT | Status: DC
  Administered 2013-10-07 – 2013-10-10 (×6): 2 via RESPIRATORY_TRACT
  Filled 2013-10-07: qty 6

## 2013-10-07 NOTE — Progress Notes (Signed)
Physician Daily Progress Note  Subjective: Had 6 beat vtach last night. Stable.  K replaced  No other events . Feeling better this AM   Objective: Vital signs in last 24 hours: Temp:  [97.3 F (36.3 C)-98.4 F (36.9 C)] 97.5 F (36.4 C) (05/29 0553) Pulse Rate:  [59-91] 91 (05/29 0553) Resp:  [11-23] 20 (05/29 0553) BP: (96-155)/(41-89) 135/57 mmHg (05/29 0553) SpO2:  [92 %-97 %] 92 % (05/29 0553) Weight:  [73.4 kg (161 lb 13.1 oz)-73.936 kg (163 lb)] 73.4 kg (161 lb 13.1 oz) (05/29 0553) Weight change:  Last BM Date: 10/06/13  CBG (last 3)  No results found for this basename: GLUCAP,  in the last 72 hours  Intake/Output from previous day:  Intake/Output Summary (Last 24 hours) at 10/07/13 0706 Last data filed at 10/06/13 1821  Gross per 24 hour  Intake      0 ml  Output      0 ml  Net      0 ml      Physical Exam General appearance: WF in NAD  Eyes: no scleral icterus Throat: oropharynx moist without erythema Resp: CTAB, exp wheezes throughout , no rales  Cardio:RRR, no MRG  GI: soft, non-tender; bowel sounds normal; no masses,  no organomegaly Extremities: no clubbing, cyanosis or edema   Lab Results:  Recent Labs  10/06/13 1216 10/07/13 0540  NA 141 140  K 3.3* 3.5*  CL 104 104  CO2 26 23  GLUCOSE 170* 180*  BUN 18 16  CREATININE 0.91 0.74  CALCIUM 9.2 8.9    No results found for this basename: AST, ALT, ALKPHOS, BILITOT, PROT, ALBUMIN,  in the last 72 hours   Recent Labs  10/06/13 1216 10/07/13 0540  WBC 11.0* 10.7*  NEUTROABS 8.4*  --   HGB 13.1 12.0  HCT 40.7 37.6  MCV 91.5 90.2  PLT 210 212    No results found for this basename: INR, PROTIME    No results found for this basename: CKTOTAL, CKMB, CKMBINDEX, TROPONINI,  in the last 72 hours  No results found for this basename: TSH, T4TOTAL, FREET3, T3FREE, THYROIDAB,  in the last 72 hours  No results found for this basename: VITAMINB12, FOLATE, FERRITIN, TIBC, IRON, RETICCTPCT,   in the last 72 hours  Micro Results: No results found for this or any previous visit (from the past 240 hour(s)).  Studies/Results: Dg Chest 2 View  10/06/2013   CLINICAL DATA:  Cough.  Shortness of breath.  EXAM: CHEST  2 VIEW  COMPARISON:  Chest CT 03/23/2013.  FINDINGS: Mediastinum and hilar structures are normal. Subsegmental atelectasis in the lung bases. Mild pneumonia cannot be excluded. Mild cardiomegaly. No pleural effusion or pneumothorax. Surgical clips left chest. No acute bony abnormality.  IMPRESSION: 1. Mild bibasilar atelectasis and/or infiltrates. 2. Mild cardiomegaly. 3. Surgical clips left chest.   Electronically Signed   By: Marcello Moores  Register   On: 10/06/2013 13:35     Medications: Scheduled: . ALPRAZolam  0.25 mg Oral BID  . aspirin EC  81 mg Oral Daily  . atorvastatin  40 mg Oral Daily  . azithromycin  500 mg Intravenous Q24H  . budesonide-formoterol  2 puff Inhalation BID  . cefTRIAXone (ROCEPHIN)  IV  1 g Intravenous Q24H  . cilostazol  50 mg Oral BID  . clopidogrel  75 mg Oral Q breakfast  . donepezil  10 mg Oral QHS  . enoxaparin (LOVENOX) injection  40 mg Subcutaneous Q24H  . escitalopram  20 mg Oral Daily  . hydrochlorothiazide  6.25 mg Oral Daily  . ipratropium  0.5 mg Nebulization Q6H  . losartan  50 mg Oral BID  . metoprolol succinate  50 mg Oral Daily  . pantoprazole  40 mg Oral Daily  . potassium chloride  20 mEq Oral Once  . predniSONE  60 mg Oral Q breakfast  . sodium chloride  3 mL Intravenous Q12H  . tamsulosin  0.4 mg Oral Daily   Continuous: . sodium chloride 50 mL/hr at 10/06/13 2146    Assessment/Plan:  1. COPD exacerbation-  Failed levaquin outpatient. On azith/roceph IV currently, tomorrow will be 48 hours of IV abx. Deesc steroids from IV to PO today, to start slow taper. Cont nebs for 1 more day. Cont symbicort.    Active Problems:  2. CAP (community acquired pneumonia)- 48 hours of IV abx tomorrow, then deesc to PO if stable .  mucinex for cough   3. HYPERTENSION- continue home medications.  4. CAD (coronary artery disease)- continue medical therapy. No anginal symptoms to warrant further evaluation for cardiac etiology of her dyspnea.  5. Disposition-anticipate discharge back to [facility] independent [facility] in 1-2 days. Ambulate and eat PO today. SW c/s placed       LOS: 1 day   [employer] 10/07/2013, 7:06 AM

## 2013-10-07 NOTE — Evaluation (Signed)
Physical Therapy Evaluation Patient Details Name: Melinda Mora MRN: [id number] DOB: 1933/10/21 Today's Date: 10/07/2013   History of Present Illness  [name] is a 80 year old white female with a history of COPD, coronary artery disease status post PCI who presents to the emergency department with complaint of shortness of breath.  Chest x-ray shows possible bilateral pneumonia.   Clinical Impression  Patient presents with decreased independence with mobility due to deficits listed in PT problem list.  She will benefit from skilled PT in the acute setting to allow return to independent facility.  Feel most appropriate at SNF level unless patient refuses SNF, then will need HHPT max days allowed.    Follow Up Recommendations SNF;Supervision/Assistance - 24 hour    Equipment Recommendations  None recommended by PT    Recommendations for Other Services       Precautions / Restrictions Precautions Precautions: Fall Precaution Comments: O2 dependent      Mobility  Bed Mobility Overal bed mobility: Needs Assistance Bed Mobility: Rolling;Supine to Sit;Sit to Supine Rolling: Min assist   Supine to sit: Min assist;HOB elevated Sit to supine: Mod assist   General bed mobility comments: used rail to roll with cues and assist, use of rail to come to sit leaning to right; assist for both feet into bed  Transfers Overall transfer level: Needs assistance Equipment used: Rolling walker (2 wheeled) Transfers: Sit to/from Stand Sit to Stand: Min assist;Min guard         General transfer comment: x 4 reps to achieve full upright due to c/o back pain  Ambulation/Gait Ambulation/Gait assistance: Min assist Ambulation Distance (Feet): 1 Feet Assistive device: Rolling walker (2 wheeled) Gait Pattern/deviations: Step-to pattern;Trunk flexed     General Gait Details: took two steps foward, then pt  began to push walker too far out and lean over; assist to correct and pt c/o legs  giving away so sat back on bed  Stairs            Wheelchair Mobility    Modified Rankin (Stroke Patients Only)       Balance Overall balance assessment: Needs assistance Sitting-balance support: Bilateral upper extremity supported;Feet unsupported Sitting balance-Leahy Scale: Poor Sitting balance - Comments: falls to right     Standing balance-Leahy Scale: Poor Standing balance comment: assist to stand with UE assist                              Pertinent Vitals/Pain 10/10 back pain attempting to stand    Home Living Family/patient expects to be discharged to:: Private residence Living Arrangements: Alone Available Help at Discharge: Personal care attendant Type of Home: Apartment ([facility]) Home Access: Level entry     Home Layout: One level Home Equipment: Walker - 4 wheels;Other (comment) (oxygen) Additional Comments: caregiver comes 4 days a week and stays 4 hours.     Prior Function Level of Independence: Needs assistance   Gait / Transfers Assistance Needed: walks holding onto furniture in her apartment  ADL's / Homemaking Assistance Needed: assist with bathing/dressing on days she has help; also has separate housekeeper; has meals brought to her         Hand Dominance   Dominant Hand: Right    Extremity/Trunk Assessment               Lower Extremity Assessment: Generalized weakness      Cervical / Trunk Assessment: Kyphotic  Communication  Communication: No difficulties  Cognition Arousal/Alertness: Awake/alert Behavior During Therapy: WFL for tasks assessed/performed Overall Cognitive Status: No family/caregiver present to determine baseline cognitive functioning                      General Comments      Exercises        Assessment/Plan    PT Assessment Patient needs continued PT services  PT Diagnosis Difficulty walking;Generalized weakness;Abnormality of gait   PT Problem List Decreased  strength;Decreased activity tolerance;Decreased balance;Decreased mobility;Decreased knowledge of precautions;Decreased knowledge of use of DME;Cardiopulmonary status limiting activity;Pain  PT Treatment Interventions Gait training;Stair training;Functional mobility training;Therapeutic activities;Patient/family education;Balance training;Therapeutic exercise   PT Goals (Current goals can be found in the Care Plan section) Acute Rehab PT Goals Patient Stated Goal: To return home PT Goal Formulation: With patient Time For Goal Achievement: 10/21/13 Potential to Achieve Goals: Fair    Frequency Min 3X/week   Barriers to discharge Decreased caregiver support may have 24 hour assist available to ILF (not sure)    Co-evaluation               End of Session Equipment Utilized During Treatment: Gait belt;Oxygen Activity Tolerance: Patient limited by pain;Patient limited by fatigue Patient left: in bed;with call bell/phone within reach;with bed alarm set Nurse Communication: Mobility status         Time: 6384-6659 PT Time Calculation (min): 32 min   Charges:   PT Evaluation $Initial PT Evaluation Tier I: 1 Procedure PT Treatments $Therapeutic Activity: 8-22 mins   PT G Codes:          Max Sane 10/07/2013, 3:49 PM  Magda Kiel, [place] 10/07/2013

## 2013-10-07 NOTE — Progress Notes (Signed)
Notified Dr. Brigitte Pulse of patient having 6 beats of VTach. Patient was sleeping when it occurred. Awakened patient and patient stated she was fine. Orders given to administer 44meq of potassium times one dose. Will administer per doctor's orders and continue to monitor patient to end of shift.

## 2013-10-07 NOTE — ED Provider Notes (Signed)
Medical screening examination/treatment/procedure(s) were conducted as a shared visit with non-physician practitioner(s) and myself.  I personally evaluated the patient during the encounter   .Face to face Exam:  General:  A&Ox3 HEENT:  Atraumatic Resp:  Some Wheezes Abd:  Nondistended Neuro:No focal deficits    Dot Lanes, MD 10/07/13 [id number]

## 2013-10-08 LAB — BASIC METABOLIC PANEL
BUN: 22 mg/dL (ref 6–23)
CO2: 22 meq/L (ref 19–32)
CREATININE: 0.78 mg/dL (ref 0.50–1.10)
Calcium: 8.4 mg/dL (ref 8.4–10.5)
Chloride: 107 mEq/L (ref 96–112)
GFR calc Af Amer: >90 mL/min (ref >90)
GFR calc non Af Amer: 79 mL/min — ABNORMAL LOW (ref >90)
Glucose, Bld: 128 mg/dL — ABNORMAL HIGH (ref 70–99)
Potassium: 4 mEq/L (ref 3.7–5.3)
Sodium: 139 mEq/L (ref 137–147)

## 2013-10-08 LAB — MAGNESIUM: Magnesium: 2.2 mg/dL (ref 1.5–2.5)

## 2013-10-08 LAB — PHOSPHORUS: Phosphorus: 2.8 mg/dL (ref 2.3–4.6)

## 2013-10-08 LAB — CBC
HCT: 34.8 % — ABNORMAL LOW (ref 36.0–46.0)
Hemoglobin: 11.1 g/dL — ABNORMAL LOW (ref 12.0–15.0)
MCH: 28.8 pg (ref 26.0–34.0)
MCHC: 31.9 g/dL (ref 30.0–36.0)
MCV: 90.4 fL (ref 78.0–100.0)
Platelets: 220 10*3/uL (ref 150–400)
RBC: 3.85 MIL/uL — ABNORMAL LOW (ref 3.87–5.11)
RDW: 15.8 % — ABNORMAL HIGH (ref 11.5–15.5)
WBC: 12.1 10*3/uL — ABNORMAL HIGH (ref 4.0–10.5)

## 2013-10-08 MED ORDER — IPRATROPIUM-ALBUTEROL 0.5-2.5 (3) MG/3ML IN SOLN
3.0000 mL | Freq: Three times a day (TID) | RESPIRATORY_TRACT | Status: DC
  Administered 2013-10-08 – 2013-10-10 (×5): 3 mL via RESPIRATORY_TRACT
  Filled 2013-10-08 (×5): qty 3

## 2013-10-08 MED ORDER — SACCHAROMYCES BOULARDII 250 MG PO CAPS
250.0000 mg | ORAL_CAPSULE | Freq: Two times a day (BID) | ORAL | Status: DC
  Administered 2013-10-08 – 2013-10-10 (×5): 250 mg via ORAL
  Filled 2013-10-08 (×6): qty 1

## 2013-10-08 MED ORDER — AZITHROMYCIN 500 MG PO TABS
500.0000 mg | ORAL_TABLET | Freq: Every day | ORAL | Status: DC
  Administered 2013-10-08: 500 mg via ORAL
  Filled 2013-10-08 (×2): qty 1

## 2013-10-08 MED ORDER — IPRATROPIUM-ALBUTEROL 0.5-2.5 (3) MG/3ML IN SOLN: 3.0000 mL | Freq: Four times a day (QID) | RESPIRATORY_TRACT | Status: DC | PRN

## 2013-10-08 NOTE — Progress Notes (Addendum)
Subjective: Asymptomatic 17 beat vtach run. Electrolytes nL.   It is known. She was on meds for it at one point.   If no sxs i would not call cards now.   Discussed.   She is feeling  A bit better.  Objective: Vital signs in last 24 hours: Temp:  [97.6 F (36.4 C)-98.4 F (36.9 C)] 98.4 F (36.9 C) (05/30 0900) Pulse Rate:  [71-82] 71 (05/30 0900) Resp:  [18-20] 20 (05/30 0900) BP: (107-139)/(54-68) 139/54 mmHg (05/30 0900) SpO2:  [91 %-98 %] 97 % (05/30 0900) Weight:  [72.1 kg (158 lb 15.2 oz)] 72.1 kg (158 lb 15.2 oz) (05/30 0507) Weight change: -1.836 kg (-4 lb 0.8 oz) Last BM Date: 10/07/13  Intake/Output from previous day: 05/29 0701 - 05/30 0700 In: 720 [P.O.:720] Out: 350 [Urine:350] Intake/Output this shift: Total I/O In: 240 [P.O.:240] Out: -   General appearance: alert, cooperative and appears stated age Resp: she has decreased bs bilat. she has some insp wheeze bilat.   she has a few rhonchi scattered Cardio: regular rate and rhythm, S1, S2 normal, no murmur, click, rub or gallop GI: soft, non-tender; bowel sounds normal; no masses,  no organomegaly Extremities: extremities normal, atraumatic, no cyanosis or edema Neurologic: Grossly normal   Lab Results:  Recent Labs  10/07/13 0540 10/08/13 0315  WBC 10.7* 12.1*  HGB 12.0 11.1*  HCT 37.6 34.8*  PLT 212 220   BMET  Recent Labs  10/07/13 0540 10/08/13 0315  NA 140 139  K 3.5* 4.0  CL 104 107  CO2 23 22  GLUCOSE 180* 128*  BUN 16 22  CREATININE 0.74 0.78  CALCIUM 8.9 8.4   CMET CMP     Component Value Date/Time   NA 139 10/08/2013 0315   K 4.0 10/08/2013 0315   CL 107 10/08/2013 0315   CO2 22 10/08/2013 0315   GLUCOSE 128* 10/08/2013 0315   BUN 22 10/08/2013 0315   CREATININE 0.78 10/08/2013 0315   CALCIUM 8.4 10/08/2013 0315   PROT 6.0 10/18/2011 2300   ALBUMIN 3.2* 10/18/2011 2300   AST 26 10/18/2011 2300   ALT 23 10/18/2011 2300   ALKPHOS 109 10/18/2011 2300   BILITOT 0.3 10/18/2011 2300   GFRNONAA 79* 10/08/2013 0315   GFRAA >90 10/08/2013 0315     Studies/Results: Dg Chest 2 View  10/06/2013   CLINICAL DATA:  Cough.  Shortness of breath.  EXAM: CHEST  2 VIEW  COMPARISON:  Chest CT 03/23/2013.  FINDINGS: Mediastinum and hilar structures are normal. Subsegmental atelectasis in the lung bases. Mild pneumonia cannot be excluded. Mild cardiomegaly. No pleural effusion or pneumothorax. Surgical clips left chest. No acute bony abnormality.  IMPRESSION: 1. Mild bibasilar atelectasis and/or infiltrates. 2. Mild cardiomegaly. 3. Surgical clips left chest.   Electronically Signed   By: Marcello Moores  Register   On: 10/06/2013 13:35    Medications: I have reviewed the patient's current medications.  . ALPRAZolam  0.25 mg Oral BID  . aspirin EC  81 mg Oral Daily  . atorvastatin  40 mg Oral Daily  . azithromycin  500 mg Intravenous Q24H  . budesonide-formoterol  2 puff Inhalation BID  . cefTRIAXone (ROCEPHIN)  IV  1 g Intravenous Q24H  . cilostazol  50 mg Oral BID  . clopidogrel  75 mg Oral Q breakfast  . donepezil  10 mg Oral QHS  . enoxaparin (LOVENOX) injection  40 mg Subcutaneous Q24H  . escitalopram  20 mg Oral Daily  .  hydrochlorothiazide  6.25 mg Oral Daily  . ipratropium  0.5 mg Nebulization Q6H  . losartan  50 mg Oral BID  . metoprolol succinate  50 mg Oral Daily  . pantoprazole  40 mg Oral Daily  . predniSONE  60 mg Oral Q breakfast  . sodium chloride  3 mL Intravenous Q12H  . tamsulosin  0.4 mg Oral Daily     Assessment/Plan:  Principal Problem:   COPD exacerbation-she is gradually improving. Cont. Current measures.  She is weak and will at least need hh pt upon discharge. Active Problems:   HYPERTENSION-stable.   CAD (coronary artery disease)-follow   CAP (community acquired pneumonia)-cont. Abx. Vtach-known issue. Lytes okay.  Cards consult if more severe or symptomatic. Discussed with patient and daughter. Disp:   PT recommended snf and 24 hr supervision.  It sounds  like she may need to do this as she is very weak.     LOS: 2 days   Jerlyn Ly, MD 10/08/2013, 11:20 AM

## 2013-10-08 NOTE — Progress Notes (Signed)
Notified by CMT of patient for she had a possible run of 17 beats of VTACH. The QRS complex is wide which makes it unsure of VTach but to be safe notified Dr. Haynes Kerns. Orders given to have Stat labs of Magnesium and Phosphorus drawn. Patient resting in bed and was asymptomatic when this occurred. Will continue to monitor patient to end of shift.

## 2013-10-09 LAB — BASIC METABOLIC PANEL
BUN: 23 mg/dL (ref 6–23)
CALCIUM: 8.7 mg/dL (ref 8.4–10.5)
CO2: 22 mEq/L (ref 19–32)
Chloride: 108 mEq/L (ref 96–112)
Creatinine, Ser: 0.77 mg/dL (ref 0.50–1.10)
GFR calc Af Amer: >90 mL/min (ref >90)
GFR calc non Af Amer: 79 mL/min — ABNORMAL LOW (ref >90)
GLUCOSE: 88 mg/dL (ref 70–99)
Potassium: 3.9 mEq/L (ref 3.7–5.3)
SODIUM: 140 meq/L (ref 137–147)

## 2013-10-09 LAB — CBC
HCT: 37.7 % (ref 36.0–46.0)
Hemoglobin: 11.6 g/dL — ABNORMAL LOW (ref 12.0–15.0)
MCH: 28.2 pg (ref 26.0–34.0)
MCHC: 30.8 g/dL (ref 30.0–36.0)
MCV: 91.5 fL (ref 78.0–100.0)
PLATELETS: 218 10*3/uL (ref 150–400)
RBC: 4.12 MIL/uL (ref 3.87–5.11)
RDW: 15.8 % — ABNORMAL HIGH (ref 11.5–15.5)
WBC: 8.1 10*3/uL (ref 4.0–10.5)

## 2013-10-09 MED ORDER — PREDNISONE 50 MG PO TABS
50.0000 mg | ORAL_TABLET | Freq: Every day | ORAL | Status: DC
  Administered 2013-10-10: 50 mg via ORAL
  Filled 2013-10-09 (×2): qty 1

## 2013-10-09 NOTE — Clinical Social Work Psychosocial (Addendum)
Clinical Social Work Department BRIEF PSYCHOSOCIAL ASSESSMENT 10/09/2013  Patient:  Melinda Mora, Melinda Mora     Account Number:  [id number]     Admit date:  10/06/2013  Clinical Social Worker:  Hubert Azure  Date/Time:  10/09/2013 04:46 PM  Referred by:  Physician  Date Referred:  10/09/2013 Referred for  SNF Placement   Other Referral:   Interview type:  Family Other interview type:   CSW spoke with patient's daughter Melinda Mora, as paitent was asleep.    PSYCHOSOCIAL DATA Living Status:  FACILITY Admitted from facility:  [facility] Level of care:  Independent Living Primary support name:  Melinda Mora [phone]) Primary support relationship to patient:  CHILD, ADULT Degree of support available:   Good.    CURRENT CONCERNS Current Concerns  Post-Acute Placement   Other Concerns:    SOCIAL WORK ASSESSMENT / PLAN CSW spoke with patient's daughter Melinda Mora via telephone. CSW introduced self and explained role. CSW discussed d/c plan with daughter. Per Melinda Mora, patient has been residing at [facility] for approximately 15 months. [name] stated patient uses oxygen and a wheelchair. [name] is agreeable to SNF as d/c plan and listed [facility] as first preference and [facility] as second preference. Patient's daughter reported patient has been to [facility] in the past and does not desire to return.   Assessment/plan status:  Information/Referral to [employer] Other assessment/ plan:   CSW will complete FL2 for SNF placement.   Information/referral to community resources:    PATIENT'S/FAMILY'S RESPONSE TO PLAN OF CARE: Patient's daughter thanked CSW for assistance with d/c planning needs.   [place], [place] Weekend Clinical Social Worker [phone]

## 2013-10-09 NOTE — Progress Notes (Signed)
Subjective: Feels slightly better. Did get out of bed to chair. No walking. Had a bad coughing spell this a.m.Marland Kitchen Did not note the 16 beat run of vtach.  Objective: Vital signs in last 24 hours: Temp:  [97.6 F (36.4 C)-98.1 F (36.7 C)] 97.8 F (36.6 C) (05/31 0443) Pulse Rate:  [72-77] 77 (05/31 0443) Resp:  [19-20] 19 (05/31 0443) BP: (96-126)/(39-57) 126/57 mmHg (05/31 0443) SpO2:  [92 %-97 %] 96 % (05/31 0443) Weight:  [76.3 kg (168 lb 3.4 oz)] 76.3 kg (168 lb 3.4 oz) (05/31 0443) Weight change: 4.2 kg (9 lb 4.2 oz) Last BM Date: 10/07/13  Intake/Output from previous day: 05/30 0701 - 05/31 0700 In: 1240 [P.O.:720; I.V.:420; IV Piggyback:100] Out: -  Intake/Output this shift:    General appearance: alert, cooperative and appears stated age Resp: better air movement than yesterday, still some insp. wheeze noted bilaterally. Cardio: regular rate and rhythm, S1, S2 normal, no murmur, click, rub or gallop GI: soft, non-tender; bowel sounds normal; no masses,  no organomegaly Extremities: extremities normal, atraumatic, no cyanosis or edema Neurologic: Grossly normal   Lab Results:  Recent Labs  10/08/13 0315 10/09/13 0401  WBC 12.1* 8.1  HGB 11.1* 11.6*  HCT 34.8* 37.7  PLT 220 218   BMET  Recent Labs  10/08/13 0315 10/09/13 0401  NA 139 140  K 4.0 3.9  CL 107 108  CO2 22 22  GLUCOSE 128* 88  BUN 22 23  CREATININE 0.78 0.77  CALCIUM 8.4 8.7   CMET CMP     Component Value Date/Time   NA 140 10/09/2013 0401   K 3.9 10/09/2013 0401   CL 108 10/09/2013 0401   CO2 22 10/09/2013 0401   GLUCOSE 88 10/09/2013 0401   BUN 23 10/09/2013 0401   CREATININE 0.77 10/09/2013 0401   CALCIUM 8.7 10/09/2013 0401   PROT 6.0 10/18/2011 2300   ALBUMIN 3.2* 10/18/2011 2300   AST 26 10/18/2011 2300   ALT 23 10/18/2011 2300   ALKPHOS 109 10/18/2011 2300   BILITOT 0.3 10/18/2011 2300   GFRNONAA 79* 10/09/2013 0401   GFRAA >90 10/09/2013 0401     Studies/Results: No results  found.  Medications: I have reviewed the patient's current medications.  . ALPRAZolam  0.25 mg Oral BID  . aspirin EC  81 mg Oral Daily  . atorvastatin  40 mg Oral Daily  . azithromycin  500 mg Oral Daily  . budesonide-formoterol  2 puff Inhalation BID  . cefTRIAXone (ROCEPHIN)  IV  1 g Intravenous Q24H  . cilostazol  50 mg Oral BID  . clopidogrel  75 mg Oral Q breakfast  . donepezil  10 mg Oral QHS  . enoxaparin (LOVENOX) injection  40 mg Subcutaneous Q24H  . escitalopram  20 mg Oral Daily  . hydrochlorothiazide  6.25 mg Oral Daily  . ipratropium-albuterol  3 mL Nebulization TID  . losartan  50 mg Oral BID  . metoprolol succinate  50 mg Oral Daily  . pantoprazole  40 mg Oral Daily  . predniSONE  60 mg Oral Q breakfast  . saccharomyces boulardii  250 mg Oral BID  . sodium chloride  3 mL Intravenous Q12H  . tamsulosin  0.4 mg Oral Daily     Assessment/Plan:  Principal Problem:   COPD exacerbation-cont. Meds. Reduce prednisone some. Active Problems:   HYPERTENSION-stable. V tach-no change, follow. Nonsustained and no sxs.   CAD (coronary artery disease)-follow   CAP (community acquired pneumonia)-d/c the azithro. Cont.  Rocephin. Disp:  She is okay with a brief snf type rehab stay. She is very weak and i think it is very appropriate. Consult csw for this.   LOS: 3 days   Jerlyn Ly, MD 10/09/2013, 10:30 AM

## 2013-10-09 NOTE — Progress Notes (Signed)
Pt had a run of nonsustained V-tach of 16 beats no s/s, pt has hx of this, MD notified and aware, will continue to monitor, thanks Arvella Nigh RN

## 2013-10-09 NOTE — Clinical Social Work Placement (Signed)
Clinical Social Work Department CLINICAL SOCIAL WORK PLACEMENT NOTE 10/09/2013  Patient:  Melinda Mora, Melinda Mora  Account Number:  [id number] Admit date:  10/06/2013  Clinical Social Worker:  Carrington Clamp, [place]  Date/time:  10/09/2013 06:22 PM  Clinical Social Work is seeking post-discharge placement for this patient at the following level of care:   SKILLED NURSING   (*CSW will update this form in Epic as items are completed)   10/09/2013  Patient/family provided with [place] Department of Clinical Social Work's list of facilities offering this level of care within the geographic area requested by the patient (or if unable, by the patient's family).  10/09/2013  Patient/family informed of their freedom to choose among providers that offer the needed level of care, that participate in Medicare, Medicaid or managed care program needed by the patient, have an available bed and are willing to accept the patient.  10/09/2013  Patient/family informed of [facility]' ownership interest in [facility], as well as of the fact that they are under no obligation to receive care at this facility.  PASARR submitted to [employer] on  PASARR number received from [place] on   FL2 transmitted to all facilities in geographic area requested by pt/family on  10/09/2013 FL2 transmitted to all facilities within larger geographic area on   Patient informed that his/her managed care company has contracts with or will negotiate with  certain facilities, including the following:     Patient/family informed of bed offers received:   Patient chooses bed at  Physician recommends and patient chooses bed at    Patient to be transferred to  on   Patient to be transferred to facility by   The following physician request were entered in Epic:   Additional Comments:  Chiropodist, [place] Weekend Clinical Social Worker [phone]

## 2013-10-10 LAB — CBC
HCT: 39 % (ref 36.0–46.0)
Hemoglobin: 12 g/dL (ref 12.0–15.0)
MCH: 28.1 pg (ref 26.0–34.0)
MCHC: 30.8 g/dL (ref 30.0–36.0)
MCV: 91.3 fL (ref 78.0–100.0)
PLATELETS: 209 10*3/uL (ref 150–400)
RBC: 4.27 MIL/uL (ref 3.87–5.11)
RDW: 15.5 % (ref 11.5–15.5)
WBC: 8.8 10*3/uL (ref 4.0–10.5)

## 2013-10-10 LAB — BASIC METABOLIC PANEL
BUN: 16 mg/dL (ref 6–23)
CALCIUM: 8.6 mg/dL (ref 8.4–10.5)
CO2: 22 mEq/L (ref 19–32)
Chloride: 109 mEq/L (ref 96–112)
Creatinine, Ser: 0.76 mg/dL (ref 0.50–1.10)
GFR, EST NON AFRICAN AMERICAN: 79 mL/min — AB (ref >90)
Glucose, Bld: 89 mg/dL (ref 70–99)
Potassium: 3.6 mEq/L — ABNORMAL LOW (ref 3.7–5.3)
SODIUM: 143 meq/L (ref 137–147)

## 2013-10-10 MED ORDER — SACCHAROMYCES BOULARDII 250 MG PO CAPS: 250.0000 mg | ORAL_CAPSULE | Freq: Two times a day (BID) | ORAL | Status: DC

## 2013-10-10 MED ORDER — PREDNISONE 10 MG PO TABS: ORAL_TABLET | ORAL | Status: DC

## 2013-10-10 MED ORDER — IPRATROPIUM-ALBUTEROL 0.5-2.5 (3) MG/3ML IN SOLN: RESPIRATORY_TRACT | Status: DC

## 2013-10-10 MED ORDER — GUAIFENESIN ER 600 MG PO TB12: 600.0000 mg | ORAL_TABLET | Freq: Two times a day (BID) | ORAL | Status: AC | PRN

## 2013-10-10 MED ORDER — CEFUROXIME AXETIL 500 MG PO TABS
500.0000 mg | ORAL_TABLET | Freq: Two times a day (BID) | ORAL | Status: DC
  Administered 2013-10-10: 500 mg via ORAL
  Filled 2013-10-10 (×3): qty 1

## 2013-10-10 MED ORDER — CEFUROXIME AXETIL 500 MG PO TABS: ORAL_TABLET | ORAL | Status: AC

## 2013-10-10 NOTE — Progress Notes (Signed)
Melinda Mora to be D/C'd Skilled nursing facility per MD order.  Discussed with the patient and all questions fully answered.    Medication List         acetaminophen 500 MG tablet  Commonly known as:  TYLENOL  Take 1,000 mg by mouth 2 (two) times daily as needed for mild pain or fever.     ALPRAZolam 0.25 MG tablet  Commonly known as:  XANAX  Take 0.25 mg by mouth 2 (two) times daily.     aspirin EC 81 MG tablet  Take 81 mg by mouth daily.     atorvastatin 40 MG tablet  Commonly known as:  LIPITOR  Take 1 tablet (40 mg total) by mouth daily.     budesonide-formoterol 160-4.5 MCG/ACT inhaler  Commonly known as:  SYMBICORT  Inhale 2 puffs into the lungs 2 (two) times daily.     cefUROXime 500 MG tablet  Commonly known as:  CEFTIN  Take 1 tablet (500 mg total) by mouth 2 (two) times daily with a meal through June 6th.     cilostazol 50 MG tablet  Commonly known as:  PLETAL  Take 50 mg by mouth 2 (two) times daily.     clopidogrel 75 MG tablet  Commonly known as:  PLAVIX  Take 75 mg by mouth daily with breakfast.     docusate sodium 100 MG capsule  Commonly known as:  COLACE  Take 100 mg by mouth 2 (two) times daily as needed for mild constipation.     donepezil 10 MG tablet  Commonly known as:  ARICEPT  Take 10 mg by mouth at bedtime.     escitalopram 20 MG tablet  Commonly known as:  LEXAPRO  Take 20 mg by mouth daily.     Fish Oil 1200 MG Caps  Take 1 capsule by mouth daily.     guaiFENesin 600 MG 12 hr tablet  Commonly known as:  MUCINEX  Take 1 tablet (600 mg total) by mouth 2 (two) times daily as needed for cough.     hydrochlorothiazide 25 MG tablet  Commonly known as:  HYDRODIURIL  Take 6.25 mg by mouth daily.     HYDROcodone-acetaminophen 10-325 MG per tablet  Commonly known as:  NORCO  Take 1-2 tablets by mouth every 6 (six) hours as needed for severe pain.     ipratropium-albuterol 0.5-2.5 (3) MG/3ML Soln  Commonly known as:  DUONEB  Take 3  mLs by nebulization 3 (three) times daily until doing well then convert to TID prn     losartan 50 MG tablet  Commonly known as:  COZAAR  Take 50 mg by mouth 2 (two) times daily.     metoprolol succinate 50 MG 24 hr tablet  Commonly known as:  TOPROL-XL  Take 50 mg by mouth daily. Take with or immediately following a meal.     nitroGLYCERIN 0.4 MG SL tablet  Commonly known as:  NITROSTAT  Place 0.4 mg under the tongue every 5 (five) minutes as needed for chest pain.     omeprazole 40 MG capsule  Commonly known as:  PRILOSEC  Take 40 mg by mouth daily.     OS-CAL PO  Take 500 mg by mouth daily.     polyethylene glycol packet  Commonly known as:  MIRALAX / GLYCOLAX  Take 17 g by mouth daily as needed for mild constipation.     predniSONE 10 MG tablet  Commonly known as:  DELTASONE  5 PO daily through June 5; then 4 po daily for 5 days; then 3 PO daily for 5 days; then 2 PO daily for 5 days; then 1 PO daily for 7 days; then 1/2 PO daily for 8 days; then D/c     promethazine 25 MG tablet  Commonly known as:  PHENERGAN  Take 25 mg by mouth 2 (two) times daily as needed for nausea or vomiting.     saccharomyces boulardii 250 MG capsule  Commonly known as:  FLORASTOR  Take 1 capsule (250 mg total) by mouth 2 (two) times daily.     senna 8.6 MG Tabs tablet  Commonly known as:  SENOKOT  Take 1 tablet by mouth daily as needed for mild constipation.     tamsulosin 0.4 MG Caps capsule  Commonly known as:  FLOMAX  Take 0.4 mg by mouth daily.     tiotropium 18 MCG inhalation capsule  Commonly known as:  SPIRIVA  Place 18 mcg into inhaler and inhale every morning.     VENTOLIN HFA 108 (90 BASE) MCG/ACT inhaler  Generic drug:  albuterol  Inhale 2 puffs into the lungs every 6 (six) hours as needed for shortness of breath.        VVS, Skin clean, dry and intact without evidence of skin break down, no evidence of skin tears noted. IV catheter discontinued intact. Site without  signs and symptoms of complications. Dressing and pressure applied.  An After Visit Summary was printed and given to the patient.  D/c education completed with patient/family including follow up instructions, medication list, d/c activities limitations if indicated, with other d/c instructions as indicated by MD - patient able to verbalize understanding, all questions fully answered.   Patient instructed to return to ED, call 911, or call MD for any changes in condition.   Patient escorted via stretcher, and D/C to SNF.  Melinda Mora 10/10/2013 2:13 PM

## 2013-10-10 NOTE — Progress Notes (Signed)
Pt had a 6 beat run of nonsustained V-tach no S/S and earlier a 5 beat run of nonsustained V-tach, pt has hx of this, MD aware of hx, will continue to monitor, Arvella Nigh RN

## 2013-10-10 NOTE — Discharge Summary (Signed)
Physician Discharge Summary  DISCHARGE SUMMARY   Patient ID: Melinda Mora MR#: [id number] DOB/AGE: 01-10-1934 80 y.o.   Attending Physician:Jaqulyn Chancellor Viviana Simpler  Patient's QPR:FFMBW,GYKZ M, MD  Consults:  none  Admit date: 10/06/2013 Discharge date: 10/10/2013  Discharge Diagnoses:  Principal Problem:   COPD exacerbation Active Problems:   HYPERTENSION   CAD (coronary artery disease)   CAP (community acquired pneumonia)   Patient Active Problem List   Diagnosis Date Noted  . CAP (community acquired pneumonia) 10/06/2013  . COPD exacerbation 10/06/2013  . At high risk for injury related to fall 03/17/2012  . Closed dislocation of right ankle, with complex fracture 03/16/2012  . Endometrial ca 10/28/2011  . Ventricular tachycardia 07/16/2011  . Syncope 05/22/2011  . COPD (chronic obstructive pulmonary disease) Gold B Oxygen dependent   . CAD (coronary artery disease)   . BREAST CANCER 11/16/2009  . HYPERLIPIDEMIA 11/16/2009  . HYPERTENSION 11/16/2009  . MI 11/16/2009  . ALLERGIC RHINITIS 11/16/2009  . EMPHYSEMA 11/16/2009  . PULMONARY NODULE 11/16/2009   Past Medical History  Diagnosis Date  . Hyperlipidemia   . Hypertension   . Depression   . Osteoarthritis   . Allergic rhinitis   . Raynaud disease   . Adenomatous polyp 03/2004  . Diverticulosis   . Syncope Jan 2013    NSVT noted on event monitor  . COPD (chronic obstructive pulmonary disease) with emphysema   . Pulmonary nodule, right x2  middle and lower lobes--  stable per follow-ip ct  jan 2012  . S/P primary angioplasty with coronary stent   . History of acute inferior wall myocardial infarction SEPT 2008      S/P STENTING PROXIMAL TO MID RCA  . Endometrial hyperplasia   . History of breast cancer 2011--  ONCOLOGIST- DR Mariea Clonts    S/P LEFT PARTIAL MASTECTOMY AND RADIATION---  NO RECURRENCE  . Dyspnea   . Anxiety   . CAD (coronary artery disease) CARDIOLOGIST-- DR Cristopher Peru--- LAST VISIT 08-16-2011   NOTE IN EPIC    Remote inferior MI in 2008. Extensive stenting of the RCA, s/p repeat stenting of the RCA in Dec 2010 due to restenosis  . Dry eyes, bilateral   . Heart attack   . Carotid stenosis, asymptomatic BILATERAL ICA --  0-39%  PER  DUPLEX 05-22-2011  . Clotting disorder   . Closed dislocation of right ankle, with complex fracture 03/16/2012  . Cancer     breast (lumpectomy on left breast 2 years ago)    Discharged Condition: fair   Discharge Medications:   Medication List         acetaminophen 500 MG tablet  Commonly known as:  TYLENOL  Take 1,000 mg by mouth 2 (two) times daily as needed for mild pain or fever.     ALPRAZolam 0.25 MG tablet  Commonly known as:  XANAX  Take 0.25 mg by mouth 2 (two) times daily.     aspirin EC 81 MG tablet  Take 81 mg by mouth daily.     atorvastatin 40 MG tablet  Commonly known as:  LIPITOR  Take 1 tablet (40 mg total) by mouth daily.     budesonide-formoterol 160-4.5 MCG/ACT inhaler  Commonly known as:  SYMBICORT  Inhale 2 puffs into the lungs 2 (two) times daily.     cefUROXime 500 MG tablet  Commonly known as:  CEFTIN  Take 1 tablet (500 mg total) by mouth 2 (two) times daily with a meal through June 6th.  cilostazol 50 MG tablet  Commonly known as:  PLETAL  Take 50 mg by mouth 2 (two) times daily.     clopidogrel 75 MG tablet  Commonly known as:  PLAVIX  Take 75 mg by mouth daily with breakfast.     docusate sodium 100 MG capsule  Commonly known as:  COLACE  Take 100 mg by mouth 2 (two) times daily as needed for mild constipation.     donepezil 10 MG tablet  Commonly known as:  ARICEPT  Take 10 mg by mouth at bedtime.     escitalopram 20 MG tablet  Commonly known as:  LEXAPRO  Take 20 mg by mouth daily.     Fish Oil 1200 MG Caps  Take 1 capsule by mouth daily.     guaiFENesin 600 MG 12 hr tablet  Commonly known as:  MUCINEX  Take 1 tablet (600 mg total) by mouth 2 (two) times daily as needed for cough.      hydrochlorothiazide 25 MG tablet  Commonly known as:  HYDRODIURIL  Take 6.25 mg by mouth daily.     HYDROcodone-acetaminophen 10-325 MG per tablet  Commonly known as:  NORCO  Take 1-2 tablets by mouth every 6 (six) hours as needed for severe pain.     ipratropium-albuterol 0.5-2.5 (3) MG/3ML Soln  Commonly known as:  DUONEB  Take 3 mLs by nebulization 3 (three) times daily until doing well then convert to TID prn     losartan 50 MG tablet  Commonly known as:  COZAAR  Take 50 mg by mouth 2 (two) times daily.     metoprolol succinate 50 MG 24 hr tablet  Commonly known as:  TOPROL-XL  Take 50 mg by mouth daily. Take with or immediately following a meal.     nitroGLYCERIN 0.4 MG SL tablet  Commonly known as:  NITROSTAT  Place 0.4 mg under the tongue every 5 (five) minutes as needed for chest pain.     omeprazole 40 MG capsule  Commonly known as:  PRILOSEC  Take 40 mg by mouth daily.     OS-CAL PO  Take 500 mg by mouth daily.     polyethylene glycol packet  Commonly known as:  MIRALAX / GLYCOLAX  Take 17 g by mouth daily as needed for mild constipation.     predniSONE 10 MG tablet  Commonly known as:  DELTASONE  5 PO daily through June 5; then 4 po daily for 5 days; then 3 PO daily for 5 days; then 2 PO daily for 5 days; then 1 PO daily for 7 days; then 1/2 PO daily for 8 days; then D/c     promethazine 25 MG tablet  Commonly known as:  PHENERGAN  Take 25 mg by mouth 2 (two) times daily as needed for nausea or vomiting.     saccharomyces boulardii 250 MG capsule  Commonly known as:  FLORASTOR  Take 1 capsule (250 mg total) by mouth 2 (two) times daily.     senna 8.6 MG Tabs tablet  Commonly known as:  SENOKOT  Take 1 tablet by mouth daily as needed for mild constipation.     tamsulosin 0.4 MG Caps capsule  Commonly known as:  FLOMAX  Take 0.4 mg by mouth daily.     tiotropium 18 MCG inhalation capsule  Commonly known as:  SPIRIVA  Place 18 mcg into inhaler  and inhale every morning.     VENTOLIN HFA 108 (90 BASE) MCG/ACT inhaler  Generic drug:  albuterol  Inhale 2 puffs into the lungs every 6 (six) hours as needed for shortness of breath.        Hospital Procedures: Dg Chest 2 View  10/06/2013   CLINICAL DATA:  Cough.  Shortness of breath.  EXAM: CHEST  2 VIEW  COMPARISON:  Chest CT 03/23/2013.  FINDINGS: Mediastinum and hilar structures are normal. Subsegmental atelectasis in the lung bases. Mild pneumonia cannot be excluded. Mild cardiomegaly. No pleural effusion or pneumothorax. Surgical clips left chest. No acute bony abnormality.  IMPRESSION: 1. Mild bibasilar atelectasis and/or infiltrates. 2. Mild cardiomegaly. 3. Surgical clips left chest.   Electronically Signed   By: Marcello Moores  Register   On: 10/06/2013 13:35    History of Present Illness:  Ms. Yandyke is a 80 year old white female with a history of COPD, coronary artery disease status post PCI who presents to the emergency department on 5/28 with complaint of shortness of breath. Patient stated that she had been having issues over the prior 2 weeks. She saw her nurse practitioner on 5/18 for increased shortness of breath, cough with purulent sputum production, and wheezing. She was treated with oral Levaquin and a prednisone taper which she completed 3 days prior to admission. She has not feel like she had significant improvement while on this treatment. Since discontinuing her treatment she became more lethargic with decreased by mouth intake and increased weakness. She also reported increasing shortness of breath which prompted her to come to the emergency department where her oxygen saturation were in the low 90s on her home 3 L of oxygen. Chest x-ray showed possible bilateral pneumonia. She was treated with IV Solu-Medrol and albuterol nebulizer with significant improvement in her symptoms. However, given her failed outpatient treatment she was admitted for further management   Hospital  Course: Admitted 5/28 c COPD exacerbation (her baseline is 3L [place] fio2) and possible PNA - Treated c FIO2, Nebs, IV Solumedrol and broad spectrum Abx. She failed outpt Rx. She improved slowly from SOB perspective over the weekend and she was left weak and frail.  She will need SNF to help return to baseline.  She is a DNR.  I discussed with daughter future living arrangements.  She is currently [facility]ment [place] living at [facility] with help.  We discussed near future need for ALF vrs LTCF after her rehab stay.  I asked the daughter and pt to use the resources at the SNF including Case Workers to help make any needed decisions.   Feeling better. Eating drinking having BMs. Still slowly recovering.  COPD exacerbation-cont slow prednisone taper. DNR confirmed. Transition from TID Nebs to TID prn c inhaler use as she is ready.  She is on all appropriate meds and inhalers.  She used to see Dr Elsworth Soho in Pulmonary before going only as needed with him.  She last saw him 07/2012.  She smoked upto 2 PPD for > 60 Pys before quitting in 2009 . Spirometry showed FEV1 of 65% c/w mild obstruction (surprisingly better than expected).   CAP (community acquired pneumonia)-d/c the azithro. Change Rocephin to Ceftin to complete the course. Stable and benign right lower lobe subpleural nodule on multiple CT scans - Nothing further to do.  Chronic Hypoxia- on Chronic FIO2   HYPERTENSION-stable.  Non-Sustained V tach-no change and without sxs. Tele was D/ced.  Will not adjust BB.  She has seen Dr Lovena Le for this prior.  CAD (coronary artery disease)/Prior MI -follow. Stress test 05/2011 Low risk stress nuclear  study. Horizontal images suggest small area of reversability in lateral wall not confirmed.  Dr Martinique was her Cardiologist. PAD - on meds. Dementia - On meds Constipation - on aggressive med Rx. H/O grade 1 Endometrial CA - Not amenable to elective Surgery. IUD In Place  Disp: She is okay with a brief snf type rehab  stay. We discussed [facility] and other facilities. She is very weak and it is very appropriate for SNF stay. Care management and Csw were consulted for for this. FL-2 and OOF DNR are signed. I was just called c [facility] bed and she can go anytime now.  Day of Discharge Exam BP 153/68  Pulse 62  Temp(Src) 97.4 F (36.3 C) (Oral)  Resp 20  Ht 5' (1.524 m)  Wt 75.841 kg (167 lb 3.2 oz)  BMI 32.65 kg/m2  SpO2 93%  Physical Exam:   Discharge Labs:  Recent Labs  10/08/13 0315 10/08/13 0730 10/09/13 0401 10/10/13 0543  NA 139  --  140 143  K 4.0  --  3.9 3.6*  CL 107  --  108 109  CO2 22  --  22 22  GLUCOSE 128*  --  88 89  BUN 22  --  23 16  CREATININE 0.78  --  0.77 0.76  CALCIUM 8.4  --  8.7 8.6  MG  --  2.2  --   --   PHOS  --  2.8  --   --    No results found for this basename: AST, ALT, ALKPHOS, BILITOT, PROT, ALBUMIN,  in the last 72 hours  Recent Labs  10/09/13 0401 10/10/13 0543  WBC 8.1 8.8  HGB 11.6* 12.0  HCT 37.7 39.0  MCV 91.5 91.3  PLT 218 209   No results found for this basename: CKTOTAL, CKMB, CKMBINDEX, TROPONINI,  in the last 72 hours No results found for this basename: TSH, T4TOTAL, FREET3, T3FREE, THYROIDAB,  in the last 72 hours No results found for this basename: VITAMINB12, FOLATE, FERRITIN, TIBC, IRON, RETICCTPCT,  in the last 72 hours No results found for this basename: INR,  PROTIME       Discharge instructions:  04-Intermediate Care Facility Follow-up Information   Follow up with Precious Reel, MD. (1-2 weeks after SNF stay)    Specialty:  Internal Medicine   Contact information:   [place] [address] [phone]        Disposition: SNF  Follow-up Appts: Follow-up with [facility]. Virgina Jock at [facility] 1-2 wks post d/c from SNF.  Call for appointment.  Condition on Discharge: improved from admission  Tests Needing Follow-up: No specific tests needed.  Time spent in discharge (includes  decision making & examination of pt): 45 min  Signed: Precious Reel 10/10/2013, 10:48 AM

## 2013-10-10 NOTE — Discharge Instructions (Signed)
Chronic Obstructive Pulmonary Disease  Chronic obstructive pulmonary disease (COPD) is a common lung condition in which airflow from the lungs is limited. COPD is a general term that can be used to describe many different lung problems that limit airflow, including both chronic bronchitis and emphysema.  If you have COPD, your lung function will probably never return to normal, but there are measures you can take to improve lung function and make yourself feel better.   CAUSES   · Smoking (common).    · Exposure to secondhand smoke.    · Genetic problems.  · Chronic inflammatory lung diseases or recurrent infections.  SYMPTOMS   · Shortness of breath, especially with physical activity.    · Deep, persistent (chronic) cough with a large amount of thick mucus.    · Wheezing.    · Rapid breaths (tachypnea).    · Gray or bluish discoloration (cyanosis) of the skin, especially in fingers, toes, or lips.    · Fatigue.    · Weight loss.    · Frequent infections or episodes when breathing symptoms become much worse (exacerbations).    · Chest tightness.  DIAGNOSIS   Your healthcare provider will take a medical history and perform a physical examination to make the initial diagnosis.  Additional tests for COPD may include:   · Lung (pulmonary) function tests.  · Chest X-ray.  · CT scan.  · Blood tests.  TREATMENT   Treatment available to help you feel better when you have COPD include:   · Inhaler and nebulizer medicines. These help manage the symptoms of COPD and make your breathing more comfortable  · Supplemental oxygen. Supplemental oxygen is only helpful if you have a low oxygen level in your blood.    · Exercise and physical activity. These are beneficial for nearly all people with COPD. Some people may also benefit from a pulmonary rehabilitation program.  HOME CARE INSTRUCTIONS   · Take all medicines (inhaled or pills) as directed by your health care provider.  · Only take over-the-counter or prescription medicines  for pain, fever, or discomfort as directed by your health care provider.    · Avoid over-the-counter medicines or cough syrups that dry up your airway (such as antihistamines) and slow down the elimination of secretions unless instructed otherwise by your healthcare provider.    · If you are a smoker, the most important thing that you can do is stop smoking. Continuing to smoke will cause further lung damage and breathing trouble. Ask your health care provider for help with quitting smoking. He or she can direct you to community resources or hospitals that provide support.  · Avoid exposure to irritants such as smoke, chemicals, and fumes that aggravate your breathing.  · Use oxygen therapy and pulmonary rehabilitation if directed by your health care provider. If you require home oxygen therapy, ask your healthcare provider whether you should purchase a pulse oximeter to measure your oxygen level at home.    · Avoid contact with individuals who have a contagious illness.  · Avoid extreme temperature and humidity changes.  · Eat healthy foods. Eating smaller, more frequent meals and resting before meals may help you maintain your strength.  · Stay active, but balance activity with periods of rest. Exercise and physical activity will help you maintain your ability to do things you want to do.  · Preventing infection and hospitalization is very important when you have COPD. Make sure to receive all the vaccines your health care provider recommends, especially the pneumococcal and influenza vaccines. Ask your healthcare provider whether you   need a pneumonia vaccine.  · Learn and use relaxation techniques to manage stress.  · Learn and use controlled breathing techniques as directed by your health care provider. Controlled breathing techniques include:    · Pursed lip breathing. Start by breathing in (inhaling) through your nose for 1 second. Then, purse your lips as if you were going to whistle and breathe out (exhale)  through the pursed lips for 2 seconds.    · Diaphragmatic breathing. Start by putting one hand on your abdomen just above your waist. Inhale slowly through your nose. The hand on your abdomen should move out. Then purse your lips and exhale slowly. You should be able to feel the hand on your abdomen moving in as you exhale.    · Learn and use controlled coughing to clear mucus from your lungs. Controlled coughing is a series of short, progressive coughs. The steps of controlled coughing are:    1. Lean your head slightly forward.    2. Breathe in deeply using diaphragmatic breathing.    3. Try to hold your breath for 3 seconds.    4. Keep your mouth slightly open while coughing twice.    5. Spit any mucus out into a tissue.    6. Rest and repeat the steps once or twice as needed.  SEEK MEDICAL CARE IF:   · You are coughing up more mucus than usual.    · There is a change in the color or thickness of your mucus.    · Your breathing is more labored than usual.    · Your breathing is faster than usual.    SEEK IMMEDIATE MEDICAL CARE IF:   · You have shortness of breath while you are resting.    · You have shortness of breath that prevents you from:  · Being able to talk.    · Performing your usual physical activities.    · You have chest pain lasting longer than 5 minutes.    · Your skin color is more cyanotic than usual.  · You measure low oxygen saturations for longer than 5 minutes with a pulse oximeter.  MAKE SURE YOU:   · Understand these instructions.  · Will watch your condition.  · Will get help right away if you are not doing well or get worse.  Document Released: 02/05/2005 Document Revised: 02/16/2013 Document Reviewed: 12/23/2012  ExitCare® Patient Information ©2014 [employer].

## 2013-10-10 NOTE — Clinical Social Work Placement (Signed)
     Clinical Social Work Department CLINICAL SOCIAL WORK PLACEMENT NOTE 10/10/2013  Patient:  Melinda Mora, Melinda Mora  Account Number:  [id number] Admit date:  10/06/2013  Clinical Social Worker:  Carrington Clamp, [place]  Date/time:  10/09/2013 06:22 PM  Clinical Social Work is seeking post-discharge placement for this patient at the following level of care:   SKILLED NURSING   (*CSW will update this form in Epic as items are completed)   10/09/2013  Patient/family provided with [employer] Department of Clinical Social Works list of facilities offering this level of care within the geographic area requested by the patient (or if unable, by the patients family).  10/09/2013  Patient/family informed of their freedom to choose among providers that offer the needed level of care, that participate in Medicare, Medicaid or managed care program needed by the patient, have an available bed and are willing to accept the patient.  10/09/2013  Patient/family informed of [facility] ownership interest in [facility], as well as of the fact that they are under no obligation to receive care at this facility.  PASARR submitted to EDS on  PASARR number received from [facility] on   FL2 transmitted to all facilities in geographic area requested by pt/family on  10/09/2013 FL2 transmitted to all facilities within larger geographic area on   Patient informed that his/her managed care company has contracts with or will negotiate with  certain facilities, including the following:   has existing PASARR number     Patient/family informed of bed offers received:  10/10/2013 Patient chooses bed at [facility] [place] Physician recommends and patient chooses bed at    Patient to be transferred to [place] on  10/10/2013 Patient to be transferred to facility by Ambulance  [name])  The following physician request were entered in Epic:   Additional  Comments: 10/10/13 OK per MD for d/c today to [place]. Patient is extremely pleased with this choice but wants to return to [place] when she is stronger. She exhibits a very positive attitude about her need for rehab. Patient's daughter [name] was given bed offers as patient deferred to her daughter; she chose [facility] [place] even though she states that it will be further away from her. She states that she feels it will be the "best fit" for her mother.  Nursing notified to call report. CSW signing off.  Lorie Phenix. [place], [place]

## 2013-10-10 NOTE — Progress Notes (Signed)
Subjective: Feeling better. Eating drinking having BMs. Still slowly recovering. We discussed DNR - She is now a DNR.   Objective: Vital signs in last 24 hours: Temp:  [97.1 F (36.2 C)-98.1 F (36.7 C)] 97.4 F (36.3 C) (06/01 0445) Pulse Rate:  [62-87] 62 (06/01 0445) Resp:  [16-20] 20 (06/01 0445) BP: (112-153)/(47-80) 153/68 mmHg (06/01 0445) SpO2:  [91 %-94 %] 93 % (06/01 0445) Weight:  [75.841 kg (167 lb 3.2 oz)] 75.841 kg (167 lb 3.2 oz) (06/01 0445) Weight change: -0.459 kg (-1 lb 0.2 oz) Last BM Date: 10/09/13  Intake/Output from previous day: 05/31 0701 - 06/01 0700 In: 290 [I.V.:240; IV Piggyback:50] Out: 75 [Urine:75] Intake/Output this shift:    General appearance: alert, cooperative and appears stated age Resp: Distant. Insp wheeze.  Wearing FIO2. Cardio: regular rate and rhythm, S1, S2 normal, no murmur, click, rub or gallop GI: soft, non-tender; bowel sounds normal; no masses,  no organomegaly Extremities: extremities normal, atraumatic, no cyanosis or edema Neurologic: Grossly normal   Lab Results:  Recent Labs  10/09/13 0401 10/10/13 0543  WBC 8.1 8.8  HGB 11.6* 12.0  HCT 37.7 39.0  PLT 218 209   BMET  Recent Labs  10/09/13 0401 10/10/13 0543  NA 140 143  K 3.9 3.6*  CL 108 109  CO2 22 22  GLUCOSE 88 89  BUN 23 16  CREATININE 0.77 0.76  CALCIUM 8.7 8.6   CMET CMP     Component Value Date/Time   NA 143 10/10/2013 0543   K 3.6* 10/10/2013 0543   CL 109 10/10/2013 0543   CO2 22 10/10/2013 0543   GLUCOSE 89 10/10/2013 0543   BUN 16 10/10/2013 0543   CREATININE 0.76 10/10/2013 0543   CALCIUM 8.6 10/10/2013 0543   PROT 6.0 10/18/2011 2300   ALBUMIN 3.2* 10/18/2011 2300   AST 26 10/18/2011 2300   ALT 23 10/18/2011 2300   ALKPHOS 109 10/18/2011 2300   BILITOT 0.3 10/18/2011 2300   GFRNONAA 79* 10/10/2013 0543   GFRAA >90 10/10/2013 0543     Studies/Results: No results found.  Medications: I have reviewed the patient's current medications.  .  ALPRAZolam  0.25 mg Oral BID  . aspirin EC  81 mg Oral Daily  . atorvastatin  40 mg Oral Daily  . budesonide-formoterol  2 puff Inhalation BID  . cefTRIAXone (ROCEPHIN)  IV  1 g Intravenous Q24H  . cilostazol  50 mg Oral BID  . clopidogrel  75 mg Oral Q breakfast  . donepezil  10 mg Oral QHS  . enoxaparin (LOVENOX) injection  40 mg Subcutaneous Q24H  . escitalopram  20 mg Oral Daily  . hydrochlorothiazide  6.25 mg Oral Daily  . ipratropium-albuterol  3 mL Nebulization TID  . losartan  50 mg Oral BID  . metoprolol succinate  50 mg Oral Daily  . pantoprazole  40 mg Oral Daily  . predniSONE  50 mg Oral Q breakfast  . saccharomyces boulardii  250 mg Oral BID  . sodium chloride  3 mL Intravenous Q12H  . tamsulosin  0.4 mg Oral Daily     Assessment/Plan:    COPD exacerbation-cont. Meds. Reduce prednisone slowly.  DNR confirmed.   HYPERTENSION-stable.   V tach-no change, follow. Nonsustained and no sxs. D/C tele - it will not change management   CAD (coronary artery disease)-follow   CAP (community acquired pneumonia)-d/c the azithro. Change Rocephin to Cefdinir.   Disp:  She is okay with a brief snf  type rehab stay. She is very weak and i think it is very appropriate. Consult csw for this.   LOS: 4 days   Precious Reel, MD 10/10/2013, 7:28 AM

## 2013-10-10 NOTE — Progress Notes (Signed)
UR completed Baylin Cabal K. Hillel Card, RN, BSN, [place], CCM  10/10/2013 12:18 PM

## 2013-11-09 ENCOUNTER — Encounter: Payer: Self-pay | Admitting: *Deleted

## 2013-11-10 ENCOUNTER — Ambulatory Visit (INDEPENDENT_AMBULATORY_CARE_PROVIDER_SITE_OTHER): Payer: Medicare Other | Admitting: Neurology

## 2013-11-10 ENCOUNTER — Encounter: Payer: Self-pay | Admitting: Neurology

## 2013-11-10 VITALS — BP 128/65 | HR 72

## 2013-11-10 DIAGNOSIS — R404 Transient alteration of awareness: Secondary | ICD-10-CM

## 2013-11-10 DIAGNOSIS — R4189 Other symptoms and signs involving cognitive functions and awareness: Secondary | ICD-10-CM

## 2013-11-10 DIAGNOSIS — F09 Unspecified mental disorder due to known physiological condition: Secondary | ICD-10-CM

## 2013-11-10 NOTE — Progress Notes (Signed)
[facility]    Provider:  Dr Janann Colonel Referring Provider: Precious Reel, MD Primary Care Physician:  Precious Reel, MD  CC: cognitive decline  HPI:  Melinda Mora is a 80 y.o. female here as a referral from Dr. Virgina Jock for cognitive decline  Family notes episode of unresponsiveness around 3 weeks ago. Family notes she sat forward in her wheelchair, was having shaking of her bilateral UE. Described as brief twitching movements. Lasted 15 to 30 seconds. Not responsive during the episode. Thinks eyes were opened. No vocalization during the episode. Notes she quickly returned to her normal self. No loss of bowel/bladder, no tongue biting. No further episodes. No known episodes of staring blankly, abnormal lip or eye lid movements.   Memory trouble began a few years ago. Family notes mainly a problem with short term memory.   Family notes concern that she may have had a stroke a few months ago. They note that her left side is weaker and she seems to slump towards that side. No known history of strokes or TIAs. Has history of HTN.  Currently in NH/rehab after a recent hospitalization for COPD exacerbation. Prior to that was living in ALF. Currently using a wheelchair since November 2013 after suffering a fall which resulted in a fracture of her right ankle.   Head CT from 2013 shows multiple chronic bilateral infarcts again noted. Mild cortical volume loss.  Review of Systems: Out of a complete 14 system review, the patient complains of only the following symptoms, and all other reviewed systems are negative. + shortness of breath, memory loss, confusion, tremor, anxiety  History   Social History  . Marital Status: Widowed    Spouse Name: N/A    Number of Children: 1  . Years of Education: N/A   Occupational History  . Retired Glass blower/designer, Copywriter, advertising    Social History Main Topics  . Smoking status: Former Smoker -- 2.00 packs/day for 50 years    Types:  Cigarettes    Quit date: 01/11/2007  . Smokeless tobacco: Never Used  . Alcohol Use: 3.6 oz/week    6 Glasses of wine per week     Comment: OCCASSIONAL  . Drug Use: No  . Sexual Activity: Not on file   Other Topics Concern  . Not on file   Social History Narrative   Daily caffeine 2-3 cups per day   Patient is widowed.    Patient lives at [facility].    Patient is retired.    Patient has 1 child     Family History  Problem Relation Age of Onset  . Coronary artery disease Father     & Mother  . Heart attack Father   . Gout Father   . Osteoarthritis Mother   . Heart disease Mother     congestive heart failure  . Hypertension Sister   . Heart disease Sister   . Cancer Sister     breast  . Breast cancer      Aunts  . Uterine cancer Sister   . Colon cancer Neg Hx     Past Medical History  Diagnosis Date  . Hyperlipidemia   . Hypertension   . Depression   . Osteoarthritis   . Allergic rhinitis   . Raynaud disease   . Adenomatous polyp 03/2004  . Diverticulosis   . Syncope Jan 2013    NSVT noted on event monitor  . COPD (chronic obstructive pulmonary disease) with emphysema   .  Pulmonary nodule, right x2  middle and lower lobes--  stable per follow-ip ct  jan 2012  . S/P primary angioplasty with coronary stent   . History of acute inferior wall myocardial infarction SEPT 2008      S/P STENTING PROXIMAL TO MID RCA  . Endometrial hyperplasia   . History of breast cancer 2011--  ONCOLOGIST- DR Mariea Clonts    S/P LEFT PARTIAL MASTECTOMY AND RADIATION---  NO RECURRENCE  . Dyspnea   . Anxiety   . CAD (coronary artery disease) CARDIOLOGIST-- DR Cristopher Peru--- LAST VISIT 08-16-2011  NOTE IN EPIC    Remote inferior MI in 2008. Extensive stenting of the RCA, s/p repeat stenting of the RCA in Dec 2010 due to restenosis  . Dry eyes, bilateral   . Heart attack   . Carotid stenosis, asymptomatic BILATERAL ICA --  0-39%  PER  DUPLEX 05-22-2011  . Clotting  disorder   . Closed dislocation of right ankle, with complex fracture 03/16/2012  . Cancer     breast (lumpectomy on left breast 2 years ago)    Past Surgical History  Procedure Laterality Date  . Tympanostomy tube placement    . Colonoscopy  JULY 2013  . US echocardiography  05-22-2011    LVSF NORMAL/ EF 60-65%  . Left partial mastectomy w/ bx axillary lymph node   09-04-2009    INVASIVE MAMMARY CARCINOMA/  STAGE T1b NO  . Coronary angioplasty with stent placement  04-19-2009  DR Tsuruko Murtha [place]    SINGLE VESSEL OBSTRUCTIVE DISEASE/  STENTING OF IN-STENT RESTENOSIS MID RCA/ NORMAL LVF  . Coronary angioplasty with stent placement  SEPT 2008--  [place]    STENTING PROXIMAL TO MID RCA  . Cardiovascular stress test  05-28-2011    LOW RISK STUDY  . Bilateral breast lumpectomy  BEFORE 2008  . Dilation and curettage of uterus  10/10/11  . Cataract extraction w/ intraocular lens  implant, bilateral    . Intrauterine device insertion    . Orif ankle fracture  03/16/2012    RIGHT ANKLE  . Orif ankle fracture  03/16/2012    Procedure: OPEN REDUCTION INTERNAL FIXATION (ORIF) ANKLE FRACTURE;  Surgeon: Johnny Bridge, MD;  Location: [facility];  Service: Orthopedics;  Laterality: Right;    Current Outpatient Prescriptions  Medication Sig Dispense Refill  . acetaminophen (TYLENOL) 500 MG tablet Take 1,000 mg by mouth 2 (two) times daily as needed for mild pain or fever.      Marland Kitchen albuterol (VENTOLIN HFA) 108 (90 BASE) MCG/ACT inhaler Inhale 2 puffs into the lungs every 6 (six) hours as needed for shortness of breath.       . ALPRAZolam (XANAX) 0.25 MG tablet Take 0.25 mg by mouth 2 (two) times daily.      Marland Kitchen aspirin EC 81 MG tablet Take 81 mg by mouth daily.      Marland Kitchen atorvastatin (LIPITOR) 40 MG tablet Take 1 tablet (40 mg total) by mouth daily.  90 tablet  3  . budesonide-formoterol (SYMBICORT) 160-4.5 MCG/ACT inhaler Inhale 2 puffs into the lungs 2 (two) times daily.      . Calcium  Carbonate (OS-CAL PO) Take 500 mg by mouth daily.      . cilostazol (PLETAL) 50 MG tablet Take 50 mg by mouth 2 (two) times daily.       . clopidogrel (PLAVIX) 75 MG tablet Take 75 mg by mouth daily with breakfast.      . docusate sodium (COLACE)  100 MG capsule Take 100 mg by mouth 2 (two) times daily as needed for mild constipation.      Marland Kitchen donepezil (ARICEPT) 10 MG tablet Take 10 mg by mouth at bedtime.      Marland Kitchen escitalopram (LEXAPRO) 20 MG tablet Take 20 mg by mouth daily.      Marland Kitchen guaiFENesin (MUCINEX) 600 MG 12 hr tablet Take 1 tablet (600 mg total) by mouth 2 (two) times daily as needed for cough.      . hydrochlorothiazide (HYDRODIURIL) 25 MG tablet Take 6.25 mg by mouth daily.      Marland Kitchen HYDROcodone-acetaminophen (NORCO) 10-325 MG per tablet Take 1-2 tablets by mouth every 6 (six) hours as needed for severe pain.      Marland Kitchen ipratropium-albuterol (DUONEB) 0.5-2.5 (3) MG/3ML SOLN Take 3 mLs by nebulization 3 (three) times daily until doing well then convert to TID prn  360 mL    . losartan (COZAAR) 50 MG tablet Take 50 mg by mouth 2 (two) times daily.      . metoprolol succinate (TOPROL-XL) 50 MG 24 hr tablet Take 50 mg by mouth daily. Take with or immediately following a meal.      . nitroGLYCERIN (NITROSTAT) 0.4 MG SL tablet Place 0.4 mg under the tongue every 5 (five) minutes as needed for chest pain.       . Omega-3 Fatty Acids (FISH OIL) 1200 MG CAPS Take 1 capsule by mouth daily.      Marland Kitchen omeprazole (PRILOSEC) 40 MG capsule Take 40 mg by mouth daily.      . polyethylene glycol (MIRALAX / GLYCOLAX) packet Take 17 g by mouth daily as needed for mild constipation.      . predniSONE (DELTASONE) 10 MG tablet 5 PO daily through June 5; then 4 po daily for 5 days; then 3 PO daily for 5 days; then 2 PO daily for 5 days; then 1 PO daily for 7 days; then 1/2 PO daily for 8 days; then D/c      . promethazine (PHENERGAN) 25 MG tablet Take 25 mg by mouth 2 (two) times daily as needed for nausea or vomiting.      .  saccharomyces boulardii (FLORASTOR) 250 MG capsule Take 1 capsule (250 mg total) by mouth 2 (two) times daily.      Marland Kitchen senna (SENOKOT) 8.6 MG TABS tablet Take 1 tablet by mouth daily as needed for mild constipation.      . tamsulosin (FLOMAX) 0.4 MG CAPS capsule Take 0.4 mg by mouth daily.      Marland Kitchen tiotropium (SPIRIVA) 18 MCG inhalation capsule Place 18 mcg into inhaler and inhale every morning.        No current facility-administered medications for this visit.    Allergies as of 11/10/2013 - Review Complete 11/10/2013  Allergen Reaction Noted  . Codeine Hives   . Penicillins Hives   . Shellfish allergy Other (See Comments) 10/15/2010    Vitals: BP 128/65  Pulse 72 Last Weight:  Wt Readings from Last 1 Encounters:  10/10/13 167 lb 3.2 oz (75.841 kg)   Last Height:   Ht Readings from Last 1 Encounters:  10/06/13 5' (1.524 m)     Physical exam: Exam: Gen: NAD, conversant Eyes: anicteric sclerae, moist conjunctivae HENT: Atraumatic, oropharynx clear Neck: Trachea midline; supple,  Lungs: CTA, no wheezing, rales, rhonic  CV: RRR, no MRG Abdomen: Soft, non-tender;  Extremities: No peripheral edema  Skin: Normal temperature, no rash,  Psych: Appropriate affect, pleasant  Neuro: MS:  MOCA 11/30  CN: PERRL, EOMI no nystagmus, no ptosis, sensation intact to LT V1-V3 bilat, face symmetric, no weakness, hearing grossly intact, palate elevates symmetrically, shoulder shrug 5/5 bilat,  tongue protrudes midline, no fasiculations noted.  Motor: normal bulk and tone, mild intention and postural tremor bilateral hands Strength: RUE 5/5, mild proximal 5-/5 weakness of LUE, distally 5/5, Bilateral proximal LE 5-/5, distally 5/5  Reflexes: brisk patellar but otherwise symmetrical, bilat downgoing toes  Sens: LT intact in all extremities  Gait: wheelchair bound   Assessment:  After physical and neurologic examination, review of laboratory studies, imaging,  neurophysiology testing and pre-existing records, assessment will be reviewed on the problem list.  Plan:  Treatment plan and additional workup will be reviewed under Problem List.  1)Cognitive decline 2)transient AMS 3)CVA  79y/o woman presenting for initial evaluation of cognitive decline and transient episode of AMS. Differential for cognitive decline would include a vascular dementia vs metabolic vs neurodegenerative such as Alzheimer's type. Currently on Aricept, can consider adding Namenda in the future. Would check B12, MMA and TSH if not already checked. Unclear etiology of transient AMS, episode does sound concerning for seizure episode. Has been at baseline since. Discussed possibility of EEG but family wishes to hold off at this point. Will consider if further episodes. No indication for AED at this time as it was a solitary event. Head CT from 2013 shows prior infarcts, question of new infarct as family notes increased weakness on left side. Currently on ASA, Plavix and Lipitor. Family wishes to defer repeat imaging at this time. Follow up in 6 months.   Jim Like, DO  [facility] [address] [place], [place] [address]  Phone [phone] Fax [phone]

## 2013-11-14 ENCOUNTER — Telehealth: Payer: Self-pay | Admitting: Neurology

## 2013-11-14 NOTE — Telephone Encounter (Signed)
Ilda Basset with [facility] [phone] has a question about the orders. Please call to advise.

## 2013-12-26 ENCOUNTER — Inpatient Hospital Stay (HOSPITAL_COMMUNITY)
Admission: EM | Admit: 2013-12-26 | Discharge: 2014-01-03 | DRG: 291 | Disposition: A | Discharge status: Hospice | Payer: Medicare Other | Attending: Internal Medicine | Admitting: Internal Medicine

## 2013-12-26 ENCOUNTER — Encounter (HOSPITAL_COMMUNITY): Payer: Self-pay | Admitting: Emergency Medicine

## 2013-12-26 ENCOUNTER — Emergency Department (HOSPITAL_COMMUNITY): Payer: Medicare Other

## 2013-12-26 DIAGNOSIS — I7300 Raynaud's syndrome without gangrene: Secondary | ICD-10-CM | POA: Diagnosis present

## 2013-12-26 DIAGNOSIS — Z888 Allergy status to other drugs, medicaments and biological substances status: Secondary | ICD-10-CM | POA: Diagnosis not present

## 2013-12-26 DIAGNOSIS — K5900 Constipation, unspecified: Secondary | ICD-10-CM | POA: Diagnosis present

## 2013-12-26 DIAGNOSIS — E785 Hyperlipidemia, unspecified: Secondary | ICD-10-CM | POA: Diagnosis present

## 2013-12-26 DIAGNOSIS — Z8673 Personal history of transient ischemic attack (TIA), and cerebral infarction without residual deficits: Secondary | ICD-10-CM

## 2013-12-26 DIAGNOSIS — J810 Acute pulmonary edema: Secondary | ICD-10-CM

## 2013-12-26 DIAGNOSIS — I4891 Unspecified atrial fibrillation: Secondary | ICD-10-CM

## 2013-12-26 DIAGNOSIS — J449 Chronic obstructive pulmonary disease, unspecified: Secondary | ICD-10-CM | POA: Diagnosis present

## 2013-12-26 DIAGNOSIS — R7309 Other abnormal glucose: Secondary | ICD-10-CM | POA: Diagnosis not present

## 2013-12-26 DIAGNOSIS — IMO0002 Reserved for concepts with insufficient information to code with codable children: Secondary | ICD-10-CM | POA: Diagnosis not present

## 2013-12-26 DIAGNOSIS — J441 Chronic obstructive pulmonary disease with (acute) exacerbation: Secondary | ICD-10-CM | POA: Diagnosis present

## 2013-12-26 DIAGNOSIS — R0600 Dyspnea, unspecified: Secondary | ICD-10-CM

## 2013-12-26 DIAGNOSIS — J438 Other emphysema: Secondary | ICD-10-CM

## 2013-12-26 DIAGNOSIS — Z87891 Personal history of nicotine dependence: Secondary | ICD-10-CM | POA: Diagnosis not present

## 2013-12-26 DIAGNOSIS — Z66 Do not resuscitate: Secondary | ICD-10-CM | POA: Diagnosis present

## 2013-12-26 DIAGNOSIS — Z8349 Family history of other endocrine, nutritional and metabolic diseases: Secondary | ICD-10-CM | POA: Diagnosis not present

## 2013-12-26 DIAGNOSIS — Z881 Allergy status to other antibiotic agents status: Secondary | ICD-10-CM | POA: Diagnosis not present

## 2013-12-26 DIAGNOSIS — Z8249 Family history of ischemic heart disease and other diseases of the circulatory system: Secondary | ICD-10-CM | POA: Diagnosis not present

## 2013-12-26 DIAGNOSIS — F410 Panic disorder [episodic paroxysmal anxiety] without agoraphobia: Secondary | ICD-10-CM | POA: Diagnosis present

## 2013-12-26 DIAGNOSIS — R627 Adult failure to thrive: Secondary | ICD-10-CM | POA: Diagnosis present

## 2013-12-26 DIAGNOSIS — I509 Heart failure, unspecified: Secondary | ICD-10-CM | POA: Diagnosis present

## 2013-12-26 DIAGNOSIS — R0689 Other abnormalities of breathing: Secondary | ICD-10-CM

## 2013-12-26 DIAGNOSIS — I2583 Coronary atherosclerosis due to lipid rich plaque: Secondary | ICD-10-CM

## 2013-12-26 DIAGNOSIS — J811 Chronic pulmonary edema: Secondary | ICD-10-CM

## 2013-12-26 DIAGNOSIS — Z993 Dependence on wheelchair: Secondary | ICD-10-CM | POA: Diagnosis not present

## 2013-12-26 DIAGNOSIS — Z79899 Other long term (current) drug therapy: Secondary | ICD-10-CM

## 2013-12-26 DIAGNOSIS — I252 Old myocardial infarction: Secondary | ICD-10-CM | POA: Diagnosis not present

## 2013-12-26 DIAGNOSIS — I10 Essential (primary) hypertension: Secondary | ICD-10-CM | POA: Diagnosis present

## 2013-12-26 DIAGNOSIS — I959 Hypotension, unspecified: Secondary | ICD-10-CM | POA: Diagnosis present

## 2013-12-26 DIAGNOSIS — R0602 Shortness of breath: Secondary | ICD-10-CM | POA: Diagnosis not present

## 2013-12-26 DIAGNOSIS — F0390 Unspecified dementia without behavioral disturbance: Secondary | ICD-10-CM | POA: Diagnosis present

## 2013-12-26 DIAGNOSIS — Z9849 Cataract extraction status, unspecified eye: Secondary | ICD-10-CM

## 2013-12-26 DIAGNOSIS — I5033 Acute on chronic diastolic (congestive) heart failure: Secondary | ICD-10-CM | POA: Diagnosis present

## 2013-12-26 DIAGNOSIS — Z8049 Family history of malignant neoplasm of other genital organs: Secondary | ICD-10-CM

## 2013-12-26 DIAGNOSIS — Z91013 Allergy to seafood: Secondary | ICD-10-CM

## 2013-12-26 DIAGNOSIS — J9690 Respiratory failure, unspecified, unspecified whether with hypoxia or hypercapnia: Secondary | ICD-10-CM

## 2013-12-26 DIAGNOSIS — F411 Generalized anxiety disorder: Secondary | ICD-10-CM | POA: Diagnosis present

## 2013-12-26 DIAGNOSIS — Z515 Encounter for palliative care: Secondary | ICD-10-CM | POA: Diagnosis not present

## 2013-12-26 DIAGNOSIS — I2510 Atherosclerotic heart disease of native coronary artery without angina pectoris: Secondary | ICD-10-CM | POA: Diagnosis present

## 2013-12-26 DIAGNOSIS — Z853 Personal history of malignant neoplasm of breast: Secondary | ICD-10-CM | POA: Diagnosis not present

## 2013-12-26 DIAGNOSIS — Z961 Presence of intraocular lens: Secondary | ICD-10-CM

## 2013-12-26 DIAGNOSIS — Z9861 Coronary angioplasty status: Secondary | ICD-10-CM

## 2013-12-26 DIAGNOSIS — Z9981 Dependence on supplemental oxygen: Secondary | ICD-10-CM

## 2013-12-26 DIAGNOSIS — Z9181 History of falling: Secondary | ICD-10-CM | POA: Diagnosis present

## 2013-12-26 DIAGNOSIS — J9620 Acute and chronic respiratory failure, unspecified whether with hypoxia or hypercapnia: Secondary | ICD-10-CM | POA: Diagnosis present

## 2013-12-26 DIAGNOSIS — Z803 Family history of malignant neoplasm of breast: Secondary | ICD-10-CM

## 2013-12-26 DIAGNOSIS — J984 Other disorders of lung: Secondary | ICD-10-CM | POA: Diagnosis present

## 2013-12-26 DIAGNOSIS — C50919 Malignant neoplasm of unspecified site of unspecified female breast: Secondary | ICD-10-CM | POA: Diagnosis present

## 2013-12-26 DIAGNOSIS — J9601 Acute respiratory failure with hypoxia: Secondary | ICD-10-CM

## 2013-12-26 DIAGNOSIS — R6251 Failure to thrive (child): Secondary | ICD-10-CM

## 2013-12-26 DIAGNOSIS — J9600 Acute respiratory failure, unspecified whether with hypoxia or hypercapnia: Secondary | ICD-10-CM

## 2013-12-26 LAB — CBC WITH DIFFERENTIAL/PLATELET
BASOS PCT: 0 % (ref 0–1)
Basophils Absolute: 0 10*3/uL (ref 0.0–0.1)
EOS ABS: 0.3 10*3/uL (ref 0.0–0.7)
EOS PCT: 2 % (ref 0–5)
HCT: 41.8 % (ref 36.0–46.0)
Hemoglobin: 12.7 g/dL (ref 12.0–15.0)
Lymphocytes Relative: 11 % — ABNORMAL LOW (ref 12–46)
Lymphs Abs: 2 10*3/uL (ref 0.7–4.0)
MCH: 28.1 pg (ref 26.0–34.0)
MCHC: 30.4 g/dL (ref 30.0–36.0)
MCV: 92.5 fL (ref 78.0–100.0)
Monocytes Absolute: 0.7 10*3/uL (ref 0.1–1.0)
Monocytes Relative: 4 % (ref 3–12)
Neutro Abs: 14.6 10*3/uL — ABNORMAL HIGH (ref 1.7–7.7)
Neutrophils Relative %: 83 % — ABNORMAL HIGH (ref 43–77)
Platelets: 264 10*3/uL (ref 150–400)
RBC: 4.52 MIL/uL (ref 3.87–5.11)
RDW: 16.9 % — ABNORMAL HIGH (ref 11.5–15.5)
WBC: 17.6 10*3/uL — ABNORMAL HIGH (ref 4.0–10.5)

## 2013-12-26 LAB — COMPREHENSIVE METABOLIC PANEL
ALBUMIN: 2.9 g/dL — AB (ref 3.5–5.2)
ALK PHOS: 126 U/L — AB (ref 39–117)
ALT: 17 U/L (ref 0–35)
ANION GAP: 13 (ref 5–15)
AST: 19 U/L (ref 0–37)
BUN: 14 mg/dL (ref 6–23)
CALCIUM: 8.8 mg/dL (ref 8.4–10.5)
CO2: 23 mEq/L (ref 19–32)
Chloride: 106 mEq/L (ref 96–112)
Creatinine, Ser: 0.91 mg/dL (ref 0.50–1.10)
GFR calc Af Amer: 69 mL/min — ABNORMAL LOW (ref >90)
GFR calc non Af Amer: 59 mL/min — ABNORMAL LOW (ref >90)
GLUCOSE: 164 mg/dL — AB (ref 70–99)
POTASSIUM: 4 meq/L (ref 3.7–5.3)
SODIUM: 142 meq/L (ref 137–147)
Total Bilirubin: 0.4 mg/dL (ref 0.3–1.2)
Total Protein: 6.3 g/dL (ref 6.0–8.3)

## 2013-12-26 LAB — I-STAT ARTERIAL BLOOD GAS, ED
Acid-base deficit: 1 mmol/L (ref 0.0–2.0)
BICARBONATE: 26.1 meq/L — AB (ref 20.0–24.0)
O2 Saturation: 97 %
PH ART: 7.302 — AB (ref 7.350–7.450)
Patient temperature: 98.6
TCO2: 28 mmol/L (ref 0–100)
pCO2 arterial: 52.9 mmHg — ABNORMAL HIGH (ref 35.0–45.0)
pO2, Arterial: 100 mmHg (ref 80.0–100.0)

## 2013-12-26 LAB — I-STAT CG4 LACTIC ACID, ED: LACTIC ACID, VENOUS: 1.47 mmol/L (ref 0.5–2.2)

## 2013-12-26 LAB — PRO B NATRIURETIC PEPTIDE: PRO B NATRI PEPTIDE: 1952 pg/mL — AB (ref 0–450)

## 2013-12-26 LAB — PROTIME-INR
INR: 1.11 (ref 0.00–1.49)
Prothrombin Time: 14.3 seconds (ref 11.6–15.2)

## 2013-12-26 LAB — MRSA PCR SCREENING: MRSA by PCR: NEGATIVE

## 2013-12-26 LAB — GLUCOSE, CAPILLARY: Glucose-Capillary: 122 mg/dL — ABNORMAL HIGH (ref 70–99)

## 2013-12-26 LAB — I-STAT TROPONIN, ED: Troponin i, poc: 0.05 ng/mL (ref 0.00–0.08)

## 2013-12-26 LAB — LACTIC ACID, PLASMA: Lactic Acid, Venous: 2.3 mmol/L — ABNORMAL HIGH (ref 0.5–2.2)

## 2013-12-26 LAB — CBG MONITORING, ED: GLUCOSE-CAPILLARY: 162 mg/dL — AB (ref 70–99)

## 2013-12-26 LAB — PROCALCITONIN: PROCALCITONIN: 0.12 ng/mL

## 2013-12-26 LAB — CORTISOL: Cortisol, Plasma: 49.7 ug/dL

## 2013-12-26 LAB — APTT: aPTT: 29 seconds (ref 24–37)

## 2013-12-26 LAB — MAGNESIUM: MAGNESIUM: 1.8 mg/dL (ref 1.5–2.5)

## 2013-12-26 LAB — PHOSPHORUS: PHOSPHORUS: 3.9 mg/dL (ref 2.3–4.6)

## 2013-12-26 MED ORDER — DEXTROSE 5 % IV SOLN
2.0000 g | Freq: Once | INTRAVENOUS | Status: AC
  Administered 2013-12-26: 2 g via INTRAVENOUS
  Filled 2013-12-26: qty 2

## 2013-12-26 MED ORDER — ALBUTEROL SULFATE (2.5 MG/3ML) 0.083% IN NEBU
INHALATION_SOLUTION | RESPIRATORY_TRACT | Status: AC
  Filled 2013-12-26: qty 6

## 2013-12-26 MED ORDER — VANCOMYCIN HCL IN DEXTROSE 1-5 GM/200ML-% IV SOLN
1000.0000 mg | Freq: Once | INTRAVENOUS | Status: AC
  Administered 2013-12-26: 1000 mg via INTRAVENOUS
  Filled 2013-12-26: qty 200

## 2013-12-26 MED ORDER — PANTOPRAZOLE SODIUM 40 MG IV SOLR
40.0000 mg | Freq: Every day | INTRAVENOUS | Status: DC
  Administered 2013-12-26: 40 mg via INTRAVENOUS
  Filled 2013-12-26 (×2): qty 40

## 2013-12-26 MED ORDER — INSULIN ASPART 100 UNIT/ML ~~LOC~~ SOLN
0.0000 [IU] | SUBCUTANEOUS | Status: DC
  Administered 2013-12-26: 3 [IU] via SUBCUTANEOUS
  Filled 2013-12-26: qty 1

## 2013-12-26 MED ORDER — BUDESONIDE 0.25 MG/2ML IN SUSP
0.2500 mg | Freq: Two times a day (BID) | RESPIRATORY_TRACT | Status: DC
  Administered 2013-12-26 – 2013-12-30 (×8): 0.25 mg via RESPIRATORY_TRACT
  Filled 2013-12-26 (×13): qty 2

## 2013-12-26 MED ORDER — CETYLPYRIDINIUM CHLORIDE 0.05 % MT LIQD
7.0000 mL | Freq: Two times a day (BID) | OROMUCOSAL | Status: DC
  Administered 2013-12-27: 7 mL via OROMUCOSAL

## 2013-12-26 MED ORDER — HEPARIN SODIUM (PORCINE) 5000 UNIT/ML IJ SOLN
5000.0000 [IU] | Freq: Three times a day (TID) | INTRAMUSCULAR | Status: DC
  Administered 2013-12-26 – 2013-12-27 (×3): 5000 [IU] via SUBCUTANEOUS
  Filled 2013-12-26 (×6): qty 1

## 2013-12-26 MED ORDER — SODIUM CHLORIDE 0.9 % IV SOLN
INTRAVENOUS | Status: DC
  Administered 2013-12-26 – 2014-01-02 (×3): via INTRAVENOUS

## 2013-12-26 MED ORDER — DEXTROSE 5 % IV SOLN
2.0000 g | INTRAVENOUS | Status: DC
  Administered 2013-12-27 – 2013-12-28 (×2): 2 g via INTRAVENOUS
  Filled 2013-12-26 (×2): qty 2

## 2013-12-26 MED ORDER — CHLORHEXIDINE GLUCONATE 0.12 % MT SOLN
15.0000 mL | Freq: Two times a day (BID) | OROMUCOSAL | Status: DC
Start: 2013-12-27 — End: 2013-12-27
  Filled 2013-12-26 (×3): qty 15

## 2013-12-26 MED ORDER — VANCOMYCIN HCL IN DEXTROSE 750-5 MG/150ML-% IV SOLN
750.0000 mg | Freq: Two times a day (BID) | INTRAVENOUS | Status: DC
  Administered 2013-12-27: 750 mg via INTRAVENOUS
  Filled 2013-12-26 (×4): qty 150

## 2013-12-26 MED ORDER — HEPARIN SODIUM (PORCINE) 5000 UNIT/ML IJ SOLN: 5000.0000 [IU] | Freq: Three times a day (TID) | INTRAMUSCULAR | Status: DC

## 2013-12-26 MED ORDER — ALBUTEROL (5 MG/ML) CONTINUOUS INHALATION SOLN: 10.0000 mg/h | INHALATION_SOLUTION | Freq: Once | RESPIRATORY_TRACT | Status: DC

## 2013-12-26 MED ORDER — METHYLPREDNISOLONE SODIUM SUCC 125 MG IJ SOLR
125.0000 mg | Freq: Once | INTRAMUSCULAR | Status: AC
  Administered 2013-12-26: 125 mg via INTRAVENOUS
  Filled 2013-12-26: qty 2

## 2013-12-26 MED ORDER — IPRATROPIUM-ALBUTEROL 0.5-2.5 (3) MG/3ML IN SOLN
3.0000 mL | Freq: Four times a day (QID) | RESPIRATORY_TRACT | Status: DC
  Administered 2013-12-26 – 2013-12-30 (×15): 3 mL via RESPIRATORY_TRACT
  Filled 2013-12-26 (×15): qty 3

## 2013-12-26 MED ORDER — IPRATROPIUM BROMIDE 0.02 % IN SOLN
0.5000 mg | Freq: Four times a day (QID) | RESPIRATORY_TRACT | Status: DC
Start: 2013-12-26 — End: 2013-12-26
  Administered 2013-12-26 (×2): 0.5 mg via RESPIRATORY_TRACT
  Filled 2013-12-26: qty 2.5

## 2013-12-26 MED ORDER — SODIUM CHLORIDE 0.9 % IV BOLUS (SEPSIS)
1000.0000 mL | Freq: Once | INTRAVENOUS | Status: AC
  Administered 2013-12-26: 1000 mL via INTRAVENOUS

## 2013-12-26 MED ORDER — LACTATED RINGERS IV SOLN: INTRAVENOUS | Status: DC

## 2013-12-26 MED ORDER — ALBUTEROL SULFATE (2.5 MG/3ML) 0.083% IN NEBU
5.0000 mg | INHALATION_SOLUTION | Freq: Once | RESPIRATORY_TRACT | Status: AC
Start: 2013-12-26 — End: 2013-12-26
  Administered 2013-12-26: 5 mg via RESPIRATORY_TRACT

## 2013-12-26 MED ORDER — ALBUTEROL SULFATE (2.5 MG/3ML) 0.083% IN NEBU
2.5000 mg | INHALATION_SOLUTION | Freq: Four times a day (QID) | RESPIRATORY_TRACT | Status: DC
  Administered 2013-12-26 (×2): 2.5 mg via RESPIRATORY_TRACT
  Filled 2013-12-26: qty 3

## 2013-12-26 NOTE — Progress Notes (Signed)
ANTIBIOTIC CONSULT NOTE - INITIAL  Pharmacy Consult for Vancomycin + Cefepime Indication: rule out pneumonia  Allergies  Allergen Reactions  . Codeine Hives       . Penicillins Hives  . Shellfish Allergy Other (See Comments)    Absent reflexes    Patient Measurements: Height: 5' (152.4 cm) Weight: 167 lb 1.7 oz (75.8 kg) IBW/kg (Calculated) : 45.5  Vital Signs: Temp: 98.5 F (36.9 C) (08/17 1610) Temp src: Oral (08/17 0635) BP: 106/52 mmHg (08/17 0830) Pulse Rate: 93 (08/17 0830) Intake/Output from previous day:   Intake/Output from this shift:    Labs:  Recent Labs  12/26/13 0641  WBC 17.6*  HGB 12.7  PLT 264  CREATININE 0.91   Estimated Creatinine Clearance: 47.1 ml/min (by C-G formula based on Cr of 0.91). No results found for this basename: VANCOTROUGH, VANCOPEAK, VANCORANDOM, GENTTROUGH, GENTPEAK, GENTRANDOM, TOBRATROUGH, TOBRAPEAK, TOBRARND, AMIKACINPEAK, AMIKACINTROU, AMIKACIN,  in the last 72 hours   Microbiology: No results found for this or any previous visit (from the past 720 hour(s)).  Medical History: Past Medical History  Diagnosis Date  . Hyperlipidemia   . Hypertension   . Depression   . Osteoarthritis   . Allergic rhinitis   . Raynaud disease   . Adenomatous polyp 03/2004  . Diverticulosis   . Syncope Jan 2013    NSVT noted on event monitor  . COPD (chronic obstructive pulmonary disease) with emphysema   . Pulmonary nodule, right x2  middle and lower lobes--  stable per follow-ip ct  jan 2012  . S/P primary angioplasty with coronary stent   . History of acute inferior wall myocardial infarction SEPT 2008      S/P STENTING PROXIMAL TO MID RCA  . Endometrial hyperplasia   . History of breast cancer 2011--  ONCOLOGIST- DR Mariea Clonts    S/P LEFT PARTIAL MASTECTOMY AND RADIATION---  NO RECURRENCE  . Dyspnea   . Anxiety   . CAD (coronary artery disease) CARDIOLOGIST-- DR Cristopher Peru--- LAST VISIT 08-16-2011  NOTE IN EPIC    Remote  inferior MI in 2008. Extensive stenting of the RCA, s/p repeat stenting of the RCA in Dec 2010 due to restenosis  . Dry eyes, bilateral   . Heart attack   . Carotid stenosis, asymptomatic BILATERAL ICA --  0-39%  PER  DUPLEX 05-22-2011  . Clotting disorder   . Closed dislocation of right ankle, with complex fracture 03/16/2012  . Cancer     breast (lumpectomy on left breast 2 years ago)    Assessment: 58 YOF who presented to [facility] with SOB and hypotension after falling out of a wheelchair at the [facility]. CCM consulted to manage respiratory issues. Pharmacy consulted to start Vancomycin + Cefepime for r/o HCAP.  Wt: 75.8 kg, SCr 0.91 (baseline 0.7-0.8), estimated CrCl~40-50 ml/min.   The patient is noted to have an allergy to PCN as hives however has tolerated cephalosporins during prior visits with no allergic response noted (most recently in May '15). Will continue with Cefepime as ordered.  Goal of Therapy:  Vancomycin trough level 15-20 mcg/ml  Plan:  1. Vancomycin 1g IV x 1 (located in ED pyxis machine) followed by 750 mg IV every 12 hours 2. Cefepime 2g IV every 24 hours 3. Will continue to follow renal function, culture results, LOT, and antibiotic de-escalation plans   Alycia Rossetti, PharmD, BCPS Clinical Pharmacist Pager: [phone] 12/26/2013 9:31 AM

## 2013-12-26 NOTE — ED Notes (Signed)
Family went to eat dr simmons into see pt pt pulled up in bed and pillow to backside

## 2013-12-26 NOTE — Progress Notes (Signed)
Placed pt on 3L [place] rt to monitor.

## 2013-12-26 NOTE — Progress Notes (Signed)
Utilization review completed.  

## 2013-12-26 NOTE — ED Notes (Signed)
Md notified of BP 78/50

## 2013-12-26 NOTE — ED Notes (Signed)
Patient from [facility] home. Began  Having SOB approx 1 hour ago. Staff got a O2 sat of mid 70's. Staff gave Neb Tx. EMS arrived and gave another neb tx and placed on CPAP. EMS also gave 1 SL Nitro. Patient was diaphoretic with resps of 36-38. Diminished bilaterally with crackles at the top of both lungs. Hx of COPD.

## 2013-12-26 NOTE — ED Notes (Signed)
Dr Kathrynn Humble here to speak to daughter agin about code  status per daughter no CPR no intubation

## 2013-12-26 NOTE — ED Notes (Signed)
Respiratory at bedside weaning patient off of Bipap per Critical Care MD verbal order.

## 2013-12-26 NOTE — H&P (Signed)
PULMONARY / CRITICAL CARE MEDICINE   Name: Melinda Mora MRN: [id number] DOB: [birth year]    ADMISSION DATE:  12/26/2013   REFERRING MD :  EDP  CHIEF COMPLAINT:  SOB  INITIAL PRESENTATION: Hypotensive, hypoxic, hypercarbic. Edema pattern on CXR. NPPV initiated in ED  STUDIES:    SIGNIFICANT EVENTS:    HISTORY OF PRESENT ILLNESS:   80 yo WF who has a plethora of health issues and has been in steady health decline x 3 years. She is O2 dependent and wheelchair bound with PMH significant for ES COPD, CAD post MI, COPD O2 dependent, CVA, Dementia along with anxiety disorder. She is a nf resident and fell from her wheelchair 8/17. Found to be hypotensive, hypoxic, hypercarbic and was transported to ED. She is a DNR, placed on NIMVS and given IVF bolus. PCCm asked to admit. The family is very reasonable and wants her treated but comfortable. PCCM will admit to SDU and use NIMVS for temporary relief with the hope she will respond to non invasive therapy.  PAST MEDICAL HISTORY :  Past Medical History  Diagnosis Date  . Hyperlipidemia   . Hypertension   . Depression   . Osteoarthritis   . Allergic rhinitis   . Raynaud disease   . Adenomatous polyp 03/2004  . Diverticulosis   . Syncope Jan 2013    NSVT noted on event monitor  . COPD (chronic obstructive pulmonary disease) with emphysema   . Pulmonary nodule, right x2  middle and lower lobes--  stable per follow-ip ct  jan 2012  . S/P primary angioplasty with coronary stent   . History of acute inferior wall myocardial infarction SEPT 2008      S/P STENTING PROXIMAL TO MID RCA  . Endometrial hyperplasia   . History of breast cancer 2011--  ONCOLOGIST- DR Mariea Clonts    S/P LEFT PARTIAL MASTECTOMY AND RADIATION---  NO RECURRENCE  . Dyspnea   . Anxiety   . CAD (coronary artery disease) CARDIOLOGIST-- DR Cristopher Peru--- LAST VISIT 08-16-2011  NOTE IN EPIC    Remote inferior MI in 2008. Extensive stenting of the RCA, s/p repeat stenting  of the RCA in Dec 2010 due to restenosis  . Dry eyes, bilateral   . Heart attack   . Carotid stenosis, asymptomatic BILATERAL ICA --  0-39%  PER  DUPLEX 05-22-2011  . Clotting disorder   . Closed dislocation of right ankle, with complex fracture 03/16/2012  . Cancer     breast (lumpectomy on left breast 2 years ago)   Past Surgical History  Procedure Laterality Date  . Tympanostomy tube placement    . Colonoscopy  JULY 2013  . US echocardiography  05-22-2011    LVSF NORMAL/ EF 60-65%  . Left partial mastectomy w/ bx axillary lymph node   09-04-2009    INVASIVE MAMMARY CARCINOMA/  STAGE T1b NO  . Coronary angioplasty with stent placement  04-19-2009  DR PETER Martinique    SINGLE VESSEL OBSTRUCTIVE DISEASE/  STENTING OF IN-STENT RESTENOSIS MID RCA/ NORMAL LVF  . Coronary angioplasty with stent placement  SEPT 2008--  [place]    STENTING PROXIMAL TO MID RCA  . Cardiovascular stress test  05-28-2011    LOW RISK STUDY  . Bilateral breast lumpectomy  BEFORE 2008  . Dilation and curettage of uterus  10/10/11  . Cataract extraction w/ intraocular lens  implant, bilateral    . Intrauterine device insertion    . Orif ankle fracture  03/16/2012  RIGHT ANKLE  . Orif ankle fracture  03/16/2012    Procedure: OPEN REDUCTION INTERNAL FIXATION (ORIF) ANKLE FRACTURE;  Surgeon: Johnny Bridge, MD;  Location: [place];  Service: Orthopedics;  Laterality: Right;   Prior to Admission medications   Medication Sig Start Date End Date Taking? Authorizing Provider  ALPRAZolam (XANAX) 0.25 MG tablet Take 0.25 mg by mouth 2 (two) times daily.   Yes Historical Provider, MD  Amino Acids-Protein Hydrolys (FEEDING SUPPLEMENT, PRO-STAT SUGAR FREE 64,) LIQD Take 30 mLs by mouth daily.   Yes Historical Provider, MD  aspirin EC 81 MG tablet Take 81 mg by mouth daily.   Yes Historical Provider, MD  atorvastatin (LIPITOR) 40 MG tablet Take 1 tablet (40 mg total) by mouth daily. 06/11/11  Yes Peter M Martinique,  MD  budesonide-formoterol [facility]) 160-4.5 MCG/ACT inhaler Inhale 2 puffs into the lungs 2 (two) times daily.   Yes Historical Provider, MD  Calcium Carbonate (OS-CAL PO) Take 500 mg by mouth daily.   Yes Historical Provider, MD  cilostazol (PLETAL) 50 MG tablet Take 50 mg by mouth 2 (two) times daily.    Yes Historical Provider, MD  clopidogrel (PLAVIX) 75 MG tablet Take 75 mg by mouth daily with breakfast.   Yes Historical Provider, MD  donepezil (ARICEPT) 5 MG tablet Take 5 mg by mouth at bedtime.   Yes Historical Provider, MD  escitalopram (LEXAPRO) 20 MG tablet Take 20 mg by mouth daily.   Yes Historical Provider, MD  hydrochlorothiazide (HYDRODIURIL) 25 MG tablet Take 6.25 mg by mouth daily.   Yes Historical Provider, MD  HYDROcodone-acetaminophen (NORCO) 10-325 MG per tablet Take 1-2 tablets by mouth every 6 (six) hours as needed for severe pain.   Yes Historical Provider, MD  ipratropium-albuterol (DUONEB) 0.5-2.5 (3) MG/3ML SOLN Take 3 mLs by nebulization 3 (three) times daily until doing well then convert to TID prn 10/10/13  Yes Precious Reel, MD  losartan (COZAAR) 50 MG tablet Take 50 mg by mouth 2 (two) times daily.   Yes Historical Provider, MD  metoprolol succinate (TOPROL-XL) 50 MG 24 hr tablet Take 50 mg by mouth daily. Take with or immediately following a meal.   Yes Historical Provider, MD  Omega-3 Fatty Acids (FISH OIL) 1200 MG CAPS Take 1 capsule by mouth daily.   Yes Historical Provider, MD  omeprazole (PRILOSEC) 40 MG capsule Take 40 mg by mouth daily.   Yes Historical Provider, MD  saccharomyces boulardii (FLORASTOR) 250 MG capsule Take 1 capsule (250 mg total) by mouth 2 (two) times daily. 10/10/13  Yes Precious Reel, MD  tamsulosin (FLOMAX) 0.4 MG CAPS capsule Take 0.4 mg by mouth daily.   Yes Historical Provider, MD  tiotropium (SPIRIVA) 18 MCG inhalation capsule Place 18 mcg into inhaler and inhale every morning.    Yes Historical Provider, MD  acetaminophen (TYLENOL) 500 MG  tablet Take 1,000 mg by mouth 2 (two) times daily as needed for mild pain or fever.    Historical Provider, MD  albuterol (VENTOLIN HFA) 108 (90 BASE) MCG/ACT inhaler Inhale 2 puffs into the lungs every 6 (six) hours as needed for shortness of breath.     Historical Provider, MD  docusate sodium (COLACE) 100 MG capsule Take 100 mg by mouth 2 (two) times daily as needed for mild constipation.    Historical Provider, MD  guaiFENesin (MUCINEX) 600 MG 12 hr tablet Take 1 tablet (600 mg total) by mouth 2 (two) times daily as needed for cough.  10/10/13   Precious Reel, MD  nitroGLYCERIN (NITROSTAT) 0.4 MG SL tablet Place 0.4 mg under the tongue every 5 (five) minutes as needed for chest pain.     Historical Provider, MD  polyethylene glycol (MIRALAX / GLYCOLAX) packet Take 17 g by mouth daily as needed for mild constipation.    Historical Provider, MD  promethazine (PHENERGAN) 25 MG tablet Take 25 mg by mouth 2 (two) times daily as needed for nausea or vomiting.    Historical Provider, MD  senna (SENOKOT) 8.6 MG TABS tablet Take 1 tablet by mouth daily as needed for mild constipation.    Historical Provider, MD   Allergies  Allergen Reactions  . Codeine Hives       . Penicillins Hives  . Shellfish Allergy Other (See Comments)    Absent reflexes    FAMILY HISTORY:  Family History  Problem Relation Age of Onset  . Coronary artery disease Father     & Mother  . Heart attack Father   . Gout Father   . Osteoarthritis Mother   . Heart disease Mother     congestive heart failure  . Hypertension Sister   . Heart disease Sister   . Cancer Sister     breast  . Breast cancer      Aunts  . Uterine cancer Sister   . Colon cancer Neg Hx    SOCIAL HISTORY:  reports that she quit smoking about 6 years ago. Her smoking use included Cigarettes. She has a 100 pack-year smoking history. She has never used smokeless tobacco. She reports that she drinks about 3.6 ounces of alcohol per week. She reports that  she does not use illicit drugs.  REVIEW OF SYSTEMS:  NA  SUBJECTIVE:   VITAL SIGNS: Temp:  [98.5 F (36.9 C)] 98.5 F (36.9 C) (08/17 0635) Pulse Rate:  [90-114] 95 (08/17 1400) Resp:  [16-38] 17 (08/17 1400) BP: (78-116)/(40-79) 116/57 mmHg (08/17 1519) SpO2:  [88 %-98 %] 91 % (08/17 1400) FiO2 (%):  [100 %] 100 % (08/17 0701) Weight:  [75.8 kg (167 lb 1.7 oz)-83.6 kg (184 lb 4.9 oz)] 83.6 kg (184 lb 4.9 oz) (08/17 1519) HEMODYNAMICS:   VENTILATOR SETTINGS: Vent Mode:  [-] PCV FiO2 (%):  [100 %] 100 % Set Rate:  [15 bmp] 15 bmp INTAKE / OUTPUT: No intake or output data in the 24 hours ending 12/26/13 1602  PHYSICAL EXAMINATION: General:  MO, lethargic, WF , who follows commands and is comfortable on nimvs. Neuro: Follows commands, mae x 4 to voice HEENT:  No JVD/LAN Cardiovascular:  HSD RRR Lungs: Decreased air movement. No overt wheeze Abdomen: Obese + bs Musculoskeletal:  Intact Skin:  Warm / Dry  LABS:  CBC  Recent Labs Lab 12/26/13 0641  WBC 17.6*  HGB 12.7  HCT 41.8  PLT 264   Coag's  Recent Labs Lab 12/26/13 0930  APTT 29  INR 1.11   BMET  Recent Labs Lab 12/26/13 0641  NA 142  K 4.0  CL 106  CO2 23  BUN 14  CREATININE 0.91  GLUCOSE 164*   Electrolytes  Recent Labs Lab 12/26/13 0641 12/26/13 0930  CALCIUM 8.8  --   MG  --  1.8  PHOS  --  3.9   Sepsis Markers  Recent Labs Lab 12/26/13 0654 12/26/13 0930 12/26/13 0935  LATICACIDVEN 1.47  --  2.3*  PROCALCITON  --  0.12  --    ABG  Recent Labs Lab 12/26/13  0726  PHART 7.302*  PCO2ART 52.9*  PO2ART 100.0   Liver Enzymes  Recent Labs Lab 12/26/13 0641  AST 19  ALT 17  ALKPHOS 126*  BILITOT 0.4  ALBUMIN 2.9*   Cardiac Enzymes  Recent Labs Lab 12/26/13 0641  PROBNP 1952.0*   Glucose  Recent Labs Lab 12/26/13 1346  GLUCAP 162*    Imaging No results found.   ASSESSMENT / PLAN:  PULMONARY OETT A: Hypercarbic/hypoxic resp failure acute on  chronic. Possible pulmonary infection. ES COPD MO Continued decline over 3 years P:   Trial of NIMVS BD's Empiric abx Diuresis if bp allows No steroids unless bronchospastic If refractory to interventions family would want comfort as priority.   CARDIOVASCULAR CVL A:  Hypotension(on 3 antihypertensives) CAD with hx of MI BNP 19K P:  DC antihypertensives DC fluid boluses  Gentle hydration if needed  RENAL A:   No acute issue P:     GASTROINTESTINAL A:   No acute issue NPO for now P:   PPI  HEMATOLOGIC A:   No acute issue P:    INFECTIOUS A:  Elevated WBC NHR Cannot rule out pulmonary infection. Treated for CAL in 5/15  P:   BCx2  8/7>> UC  8/17>> Sputum 8/17>> Abx: Cefepime, start date 8/17, day 0/x Abx: vanc, start date 8/17, day 0/x Low threshold to dc abx   ENDOCRINE A:   Hyperglycemia   P:   SSI  NEUROLOGIC A:   Hx of dementia and CVA. Anxiety P:   RASS goal: 1 Awake and follows simple commands. Careful use of anxiolytics/opiates unless for comfort care.   TODAY'S SUMMARY:  80 yo female, wheelchair bound with progressive decline over 3 years. Resident of SNF and admitted with AMS, fall from wheelchair coupled with increased hypoxia. She is a DNR/DNI per sisters.  She is currently hypoxic, hypercarbic and hypotensive.   Richardson Landry Minor ACNP Maryanna Shape PCCM Pager [phone] till 3 pm If no answer page [phone] 12/26/2013, 4:02 PM  PCCM ATTENDING: I have interviewed and examined the patient and reviewed the database. I have formulated the assessment and plan as reflected in the note above with amendments made by me.   Suspect pulmonary edema is primary problem. Family updated in detail  Merton Border, MD ; [facility] [phone].  After 5:30 PM or weekends, call [phone] Pulmonary and [employer] Pager: [phone]  12/26/2013, 4:02 PM

## 2013-12-26 NOTE — ED Notes (Signed)
Lab got blood cultures before antiobitcs started

## 2013-12-26 NOTE — H&P (Deleted)
PULMONARY / CRITICAL CARE MEDICINE   Name: Melinda Mora MRN: [id number] DOB: 1934/01/04    ADMISSION DATE:  12/26/2013   REFERRING MD :  EDP  CHIEF COMPLAINT:  SOB  INITIAL PRESENTATION: Hypotensive, hypoxic, hypercarbic.  STUDIES:    SIGNIFICANT EVENTS:    HISTORY OF PRESENT ILLNESS:   80 yo WF who has a plethora of health issues and has been in steady health decline x 3 years. She is O2 dependent and wheelchair bound with PMH significant for ES COPD, CAD post MI, COPD O2 dependent, CVA, Dementia along with anxiety disorder. She is a nf resident and fell from her wheelchair 8/17. Found to be hypotensive, hypoxic, hypercarbic and was transported to ED. She is a DNR, placed on NIMVS and given IVF bolus. PCCm asked to admit. The family is very reasonable and wants her treated but comfortable. PCCM will admit to SDU and use NIMVS for temporary relief with the hope she will respond to non invasive therapy.  PAST MEDICAL HISTORY :  Past Medical History  Diagnosis Date  . Hyperlipidemia   . Hypertension   . Depression   . Osteoarthritis   . Allergic rhinitis   . Raynaud disease   . Adenomatous polyp 03/2004  . Diverticulosis   . Syncope Jan 2013    NSVT noted on event monitor  . COPD (chronic obstructive pulmonary disease) with emphysema   . Pulmonary nodule, right x2  middle and lower lobes--  stable per follow-ip ct  jan 2012  . S/P primary angioplasty with coronary stent   . History of acute inferior wall myocardial infarction SEPT 2008      S/P STENTING PROXIMAL TO MID RCA  . Endometrial hyperplasia   . History of breast cancer 2011--  ONCOLOGIST- DR Mariea Clonts    S/P LEFT PARTIAL MASTECTOMY AND RADIATION---  NO RECURRENCE  . Dyspnea   . Anxiety   . CAD (coronary artery disease) CARDIOLOGIST-- DR Cristopher Peru--- LAST VISIT 08-16-2011  NOTE IN EPIC    Remote inferior MI in 2008. Extensive stenting of the RCA, s/p repeat stenting of the RCA in Dec 2010 due to restenosis  .  Dry eyes, bilateral   . Heart attack   . Carotid stenosis, asymptomatic BILATERAL ICA --  0-39%  PER  DUPLEX 05-22-2011  . Clotting disorder   . Closed dislocation of right ankle, with complex fracture 03/16/2012  . Cancer     breast (lumpectomy on left breast 2 years ago)   Past Surgical History  Procedure Laterality Date  . Tympanostomy tube placement    . Colonoscopy  JULY 2013  . US echocardiography  05-22-2011    LVSF NORMAL/ EF 60-65%  . Left partial mastectomy w/ bx axillary lymph node   09-04-2009    INVASIVE MAMMARY CARCINOMA/  STAGE T1b NO  . Coronary angioplasty with stent placement  04-19-2009  DR PETER Martinique    SINGLE VESSEL OBSTRUCTIVE DISEASE/  STENTING OF IN-STENT RESTENOSIS MID RCA/ NORMAL LVF  . Coronary angioplasty with stent placement  SEPT 2008--  [place]    STENTING PROXIMAL TO MID RCA  . Cardiovascular stress test  05-28-2011    LOW RISK STUDY  . Bilateral breast lumpectomy  BEFORE 2008  . Dilation and curettage of uterus  10/10/11  . Cataract extraction w/ intraocular lens  implant, bilateral    . Intrauterine device insertion    . Orif ankle fracture  03/16/2012    RIGHT ANKLE  . Orif  ankle fracture  03/16/2012    Procedure: OPEN REDUCTION INTERNAL FIXATION (ORIF) ANKLE FRACTURE;  Surgeon: Johnny Bridge, MD;  Location: [place];  Service: Orthopedics;  Laterality: Right;   Prior to Admission medications   Medication Sig Start Date End Date Taking? Authorizing Provider  ALPRAZolam (XANAX) 0.25 MG tablet Take 0.25 mg by mouth 2 (two) times daily.   Yes Historical Provider, MD  Amino Acids-Protein Hydrolys (FEEDING SUPPLEMENT, PRO-STAT SUGAR FREE 64,) LIQD Take 30 mLs by mouth daily.   Yes Historical Provider, MD  aspirin EC 81 MG tablet Take 81 mg by mouth daily.   Yes Historical Provider, MD  atorvastatin (LIPITOR) 40 MG tablet Take 1 tablet (40 mg total) by mouth daily. 06/11/11  Yes Peter M Martinique, MD  budesonide-formoterol [facility])  160-4.5 MCG/ACT inhaler Inhale 2 puffs into the lungs 2 (two) times daily.   Yes Historical Provider, MD  Calcium Carbonate (OS-CAL PO) Take 500 mg by mouth daily.   Yes Historical Provider, MD  cilostazol (PLETAL) 50 MG tablet Take 50 mg by mouth 2 (two) times daily.    Yes Historical Provider, MD  clopidogrel (PLAVIX) 75 MG tablet Take 75 mg by mouth daily with breakfast.   Yes Historical Provider, MD  donepezil (ARICEPT) 5 MG tablet Take 5 mg by mouth at bedtime.   Yes Historical Provider, MD  escitalopram (LEXAPRO) 20 MG tablet Take 20 mg by mouth daily.   Yes Historical Provider, MD  hydrochlorothiazide (HYDRODIURIL) 25 MG tablet Take 6.25 mg by mouth daily.   Yes Historical Provider, MD  HYDROcodone-acetaminophen (NORCO) 10-325 MG per tablet Take 1-2 tablets by mouth every 6 (six) hours as needed for severe pain.   Yes Historical Provider, MD  ipratropium-albuterol (DUONEB) 0.5-2.5 (3) MG/3ML SOLN Take 3 mLs by nebulization 3 (three) times daily until doing well then convert to TID prn 10/10/13  Yes Precious Reel, MD  losartan (COZAAR) 50 MG tablet Take 50 mg by mouth 2 (two) times daily.   Yes Historical Provider, MD  metoprolol succinate (TOPROL-XL) 50 MG 24 hr tablet Take 50 mg by mouth daily. Take with or immediately following a meal.   Yes Historical Provider, MD  Omega-3 Fatty Acids (FISH OIL) 1200 MG CAPS Take 1 capsule by mouth daily.   Yes Historical Provider, MD  omeprazole (PRILOSEC) 40 MG capsule Take 40 mg by mouth daily.   Yes Historical Provider, MD  saccharomyces boulardii (FLORASTOR) 250 MG capsule Take 1 capsule (250 mg total) by mouth 2 (two) times daily. 10/10/13  Yes Precious Reel, MD  tamsulosin (FLOMAX) 0.4 MG CAPS capsule Take 0.4 mg by mouth daily.   Yes Historical Provider, MD  tiotropium (SPIRIVA) 18 MCG inhalation capsule Place 18 mcg into inhaler and inhale every morning.    Yes Historical Provider, MD  acetaminophen (TYLENOL) 500 MG tablet Take 1,000 mg by mouth 2 (two)  times daily as needed for mild pain or fever.    Historical Provider, MD  albuterol (VENTOLIN HFA) 108 (90 BASE) MCG/ACT inhaler Inhale 2 puffs into the lungs every 6 (six) hours as needed for shortness of breath.     Historical Provider, MD  docusate sodium (COLACE) 100 MG capsule Take 100 mg by mouth 2 (two) times daily as needed for mild constipation.    Historical Provider, MD  guaiFENesin (MUCINEX) 600 MG 12 hr tablet Take 1 tablet (600 mg total) by mouth 2 (two) times daily as needed for cough. 10/10/13   Latricia Heft  Virgina Jock, MD  nitroGLYCERIN (NITROSTAT) 0.4 MG SL tablet Place 0.4 mg under the tongue every 5 (five) minutes as needed for chest pain.     Historical Provider, MD  polyethylene glycol (MIRALAX / GLYCOLAX) packet Take 17 g by mouth daily as needed for mild constipation.    Historical Provider, MD  promethazine (PHENERGAN) 25 MG tablet Take 25 mg by mouth 2 (two) times daily as needed for nausea or vomiting.    Historical Provider, MD  senna (SENOKOT) 8.6 MG TABS tablet Take 1 tablet by mouth daily as needed for mild constipation.    Historical Provider, MD   Allergies  Allergen Reactions  . Codeine Hives       . Penicillins Hives  . Shellfish Allergy Other (See Comments)    Absent reflexes    FAMILY HISTORY:  Family History  Problem Relation Age of Onset  . Coronary artery disease Father     & Mother  . Heart attack Father   . Gout Father   . Osteoarthritis Mother   . Heart disease Mother     congestive heart failure  . Hypertension Sister   . Heart disease Sister   . Cancer Sister     breast  . Breast cancer      Aunts  . Uterine cancer Sister   . Colon cancer Neg Hx    SOCIAL HISTORY:  reports that she quit smoking about 6 years ago. Her smoking use included Cigarettes. She has a 100 pack-year smoking history. She has never used smokeless tobacco. She reports that she drinks about 3.6 ounces of alcohol per week. She reports that she does not use illicit  drugs.  REVIEW OF SYSTEMS:  NA  SUBJECTIVE:   VITAL SIGNS: Temp:  [98.5 F (36.9 C)] 98.5 F (36.9 C) (08/17 0635) Pulse Rate:  [90-114] 93 (08/17 0830) Resp:  [17-38] 19 (08/17 0830) BP: (78-106)/(40-58) 106/52 mmHg (08/17 0830) SpO2:  [90 %-98 %] 94 % (08/17 0830) FiO2 (%):  [100 %] 100 % (08/17 0701) HEMODYNAMICS:   VENTILATOR SETTINGS: Vent Mode:  [-] PCV FiO2 (%):  [100 %] 100 % Set Rate:  [15 bmp] 15 bmp INTAKE / OUTPUT: No intake or output data in the 24 hours ending 12/26/13 0847  PHYSICAL EXAMINATION: General:  MO, lethargic, WF , who follows commands and is comfortable on nimvs. Neuro: Follows commands, mae x 4 to voice HEENT:  No JVD/LAN Cardiovascular:  HSD RRR Lungs: Decreased air movement. No overt wheeze Abdomen: Obese + bs Musculoskeletal:  Intact Skin:  Warm / Dry  LABS:  CBC  Recent Labs Lab 12/26/13 0641  WBC 17.6*  HGB 12.7  HCT 41.8  PLT 264   Coag's No results found for this basename: APTT, INR,  in the last 168 hours BMET  Recent Labs Lab 12/26/13 0641  NA 142  K 4.0  CL 106  CO2 23  BUN 14  CREATININE 0.91  GLUCOSE 164*   Electrolytes  Recent Labs Lab 12/26/13 0641  CALCIUM 8.8   Sepsis Markers  Recent Labs Lab 12/26/13 0654  LATICACIDVEN 1.47   ABG  Recent Labs Lab 12/26/13 0726  PHART 7.302*  PCO2ART 52.9*  PO2ART 100.0   Liver Enzymes  Recent Labs Lab 12/26/13 0641  AST 19  ALT 17  ALKPHOS 126*  BILITOT 0.4  ALBUMIN 2.9*   Cardiac Enzymes  Recent Labs Lab 12/26/13 0641  PROBNP 1952.0*   Glucose No results found for this basename: GLUCAP,  in  the last 168 hours  Imaging No results found.   ASSESSMENT / PLAN:  PULMONARY OETT A: Hypercarbic/hypoxic resp failure acute on chronic. Possible pulmonary infection. ES COPD MO Continued decline over 3 years P:   Trial of NIMVS BD's Empiric abx Diuresis if bp allows No steroids unless bronchospastic If refractory to interventions  family would want comfort as priority.   CARDIOVASCULAR CVL A:  Hypotension(on 3 antihypertensives) CAD with hx of MI BNP 19K P:  DC antihypertensives DC fluid boluses  Gentle hydration if needed  RENAL A:   No acute issue P:     GASTROINTESTINAL A:   No acute issue NPO for now P:   PPI  HEMATOLOGIC A:   No acute issue P:    INFECTIOUS A:  Elevated WBC NHR Cannot rule out pulmonary infection. Treated for CAL in 5/15  P:   BCx2  8/7>> UC  8/17>> Sputum 8/17>> Abx: Cefepime, start date 8/17, day 0/x Abx: vanc, start date 8/17, day 0/x Low threshold to dc abx   ENDOCRINE A:   Hyperglycemia   P:   SSI  NEUROLOGIC A:   Hx of dementia and CVA. Anxiety P:   RASS goal: 1 Awake and follows simple commands. Careful use of anxiolytics/opiates unless for comfort care.   TODAY'S SUMMARY:  80 yo female, wheelchair bound with progressive decline over 3 years. Resident of SNF and admitted with AMS, fall from wheelchair coupled with increased hypoxia. She is a DNR/DNI per sisters.  She is currently hypoxic, hypercarbic and hypotensive.   Richardson Landry Aide Wojnar ACNP Maryanna Shape PCCM Pager [phone] till 3 pm If no answer page [phone] 12/26/2013, 8:53 AM  I have personally obtained a history, examined the patient, evaluated laboratory and imaging results, formulated the assessment and plan and placed orders. CRITICAL CARE: The patient is critically ill with multiple organ systems failure and requires high complexity decision making for assessment and support, frequent evaluation and titration of therapies, application of advanced monitoring technologies and extensive interpretation of multiple databases. Critical Care Time devoted to patient care services described in this note is ---- minutes.    Pulmonary and [place] Pager: [phone]  12/26/2013, 8:47 AM

## 2013-12-26 NOTE — ED Notes (Signed)
Richardson Landry PA from Critical care here to admit pt and speak to family

## 2013-12-27 ENCOUNTER — Inpatient Hospital Stay (HOSPITAL_COMMUNITY): Payer: Medicare Other

## 2013-12-27 DIAGNOSIS — Z515 Encounter for palliative care: Secondary | ICD-10-CM

## 2013-12-27 DIAGNOSIS — I517 Cardiomegaly: Secondary | ICD-10-CM

## 2013-12-27 DIAGNOSIS — I5033 Acute on chronic diastolic (congestive) heart failure: Secondary | ICD-10-CM

## 2013-12-27 LAB — BASIC METABOLIC PANEL
ANION GAP: 13 (ref 5–15)
BUN: 13 mg/dL (ref 6–23)
CO2: 21 meq/L (ref 19–32)
CREATININE: 0.71 mg/dL (ref 0.50–1.10)
Calcium: 8.9 mg/dL (ref 8.4–10.5)
Chloride: 106 mEq/L (ref 96–112)
GFR calc non Af Amer: 81 mL/min — ABNORMAL LOW (ref >90)
Glucose, Bld: 115 mg/dL — ABNORMAL HIGH (ref 70–99)
Potassium: 4 mEq/L (ref 3.7–5.3)
Sodium: 140 mEq/L (ref 137–147)

## 2013-12-27 LAB — GLUCOSE, CAPILLARY
Glucose-Capillary: 104 mg/dL — ABNORMAL HIGH (ref 70–99)
Glucose-Capillary: 121 mg/dL — ABNORMAL HIGH (ref 70–99)
Glucose-Capillary: 107 mg/dL — ABNORMAL HIGH (ref 70–99)
Glucose-Capillary: 100 mg/dL — ABNORMAL HIGH (ref 70–99)

## 2013-12-27 LAB — CBC
HCT: 35 % — ABNORMAL LOW (ref 36.0–46.0)
Hemoglobin: 10.6 g/dL — ABNORMAL LOW (ref 12.0–15.0)
MCH: 27.6 pg (ref 26.0–34.0)
MCHC: 30.3 g/dL (ref 30.0–36.0)
MCV: 91.1 fL (ref 78.0–100.0)
PLATELETS: 241 10*3/uL (ref 150–400)
RBC: 3.84 MIL/uL — ABNORMAL LOW (ref 3.87–5.11)
RDW: 17 % — ABNORMAL HIGH (ref 11.5–15.5)
WBC: 9 10*3/uL (ref 4.0–10.5)

## 2013-12-27 LAB — TROPONIN I: Troponin I: <0.3 ng/mL (ref <0.30)

## 2013-12-27 LAB — STREP PNEUMONIAE URINARY ANTIGEN: STREP PNEUMO URINARY ANTIGEN: NEGATIVE

## 2013-12-27 LAB — PRO B NATRIURETIC PEPTIDE: Pro B Natriuretic peptide (BNP): 3762 pg/mL — ABNORMAL HIGH (ref 0–450)

## 2013-12-27 MED ORDER — CETYLPYRIDINIUM CHLORIDE 0.05 % MT LIQD
7.0000 mL | Freq: Two times a day (BID) | OROMUCOSAL | Status: DC
  Administered 2013-12-28 – 2014-01-03 (×9): 7 mL via OROMUCOSAL

## 2013-12-27 MED ORDER — METOPROLOL TARTRATE 1 MG/ML IV SOLN
INTRAVENOUS | Status: AC
  Filled 2013-12-27: qty 5

## 2013-12-27 MED ORDER — FUROSEMIDE 10 MG/ML IJ SOLN
20.0000 mg | Freq: Once | INTRAMUSCULAR | Status: AC
  Administered 2013-12-27: 20 mg via INTRAVENOUS

## 2013-12-27 MED ORDER — LORAZEPAM 2 MG/ML IJ SOLN
0.5000 mg | Freq: Once | INTRAMUSCULAR | Status: AC
  Administered 2013-12-27: 0.5 mg via INTRAVENOUS

## 2013-12-27 MED ORDER — FENTANYL CITRATE 0.05 MG/ML IJ SOLN: 25.0000 ug | INTRAMUSCULAR | Status: DC | PRN

## 2013-12-27 MED ORDER — ENOXAPARIN SODIUM 40 MG/0.4ML ~~LOC~~ SOLN
40.0000 mg | SUBCUTANEOUS | Status: DC
  Administered 2013-12-28: 40 mg via SUBCUTANEOUS
  Filled 2013-12-27: qty 0.4

## 2013-12-27 MED ORDER — FUROSEMIDE 10 MG/ML IJ SOLN
INTRAMUSCULAR | Status: AC
  Filled 2013-12-27: qty 4

## 2013-12-27 MED ORDER — LORAZEPAM 2 MG/ML IJ SOLN
0.5000 mg | INTRAMUSCULAR | Status: DC | PRN
  Administered 2013-12-27: 0.5 mg via INTRAVENOUS
  Filled 2013-12-27: qty 1

## 2013-12-27 MED ORDER — PANTOPRAZOLE SODIUM 40 MG PO TBEC
40.0000 mg | DELAYED_RELEASE_TABLET | Freq: Every day | ORAL | Status: DC
  Administered 2013-12-28 – 2013-12-30 (×3): 40 mg via ORAL
  Filled 2013-12-27 (×2): qty 1

## 2013-12-27 MED ORDER — METOPROLOL TARTRATE 1 MG/ML IV SOLN
2.5000 mg | INTRAVENOUS | Status: DC | PRN
  Administered 2013-12-27 – 2013-12-28 (×4): 5 mg via INTRAVENOUS
  Filled 2013-12-27 (×4): qty 5

## 2013-12-27 MED ORDER — CHLORHEXIDINE GLUCONATE 0.12 % MT SOLN
15.0000 mL | Freq: Two times a day (BID) | OROMUCOSAL | Status: DC
  Administered 2013-12-27 – 2014-01-02 (×9): 15 mL via OROMUCOSAL
  Filled 2013-12-27 (×16): qty 15

## 2013-12-27 MED ORDER — LORAZEPAM 2 MG/ML IJ SOLN
INTRAMUSCULAR | Status: AC
  Filled 2013-12-27: qty 1

## 2013-12-27 MED ORDER — POTASSIUM CHLORIDE CRYS ER 20 MEQ PO TBCR: 40.0000 meq | EXTENDED_RELEASE_TABLET | Freq: Once | ORAL | Status: DC

## 2013-12-27 MED ORDER — PNEUMOCOCCAL VAC POLYVALENT 25 MCG/0.5ML IJ INJ
0.5000 mL | INJECTION | INTRAMUSCULAR | Status: DC
  Filled 2013-12-27: qty 0.5

## 2013-12-27 NOTE — Progress Notes (Addendum)
PULMONARY / CRITICAL CARE MEDICINE   Name: Melinda Mora MRN: [id number] DOB: 1933/10/07    ADMISSION DATE:  12/26/2013   REFERRING MD :  EDP  CHIEF COMPLAINT:  SOB  INITIAL PRESENTATION: Hypotensive, hypoxic, hypercarbic. Edema pattern on CXR. NPPV initiated in ED  STUDIES:    SIGNIFICANT EVENTS: 8/18 0500 placed back on bipap   HISTORY OF PRESENT ILLNESS:   80 yo WF who has a plethora of health issues and has been in steady health decline x 3 years. She is O2 dependent and wheelchair bound with PMH significant for ES COPD, CAD post MI, COPD O2 dependent, CVA, Dementia along with anxiety disorder. She is a nf resident and fell from her wheelchair 8/17. Found to be hypotensive, hypoxic, hypercarbic and was transported to ED. She is a DNR, placed on NIMVS and given IVF bolus. PCCm asked to admit. The family is very reasonable and wants her treated but comfortable. PCCM will admit to SDU and use NIMVS for temporary relief with the hope she will respond to non invasive therapy.  SUBJECTIVE:  Back on NIMVS VITAL SIGNS: Temp:  [98.2 F (36.8 C)-99.1 F (37.3 C)] 98.2 F (36.8 C) (08/18 0700) Pulse Rate:  [80-166] 80 (08/18 0900) Resp:  [16-31] 30 (08/18 0900) BP: (92-180)/(45-141) 114/59 mmHg (08/18 0900) SpO2:  [76 %-100 %] 96 % (08/18 0900) FiO2 (%):  [100 %] 100 % (08/18 0459) Weight:  [184 lb 4.9 oz (83.6 kg)-185 lb 10 oz (84.2 kg)] 185 lb 10 oz (84.2 kg) (08/18 0500) HEMODYNAMICS:   VENTILATOR SETTINGS: Vent Mode:  [-]  FiO2 (%):  [100 %] 100 % INTAKE / OUTPUT:  Intake/Output Summary (Last 24 hours) at 12/27/13 0925 Last data filed at 12/27/13 0600  Gross per 24 hour  Intake    250 ml  Output      0 ml  Net    250 ml    PHYSICAL EXAMINATION: General:  MO, lethargic, WF , who follows commands and is comfortable on nimvs. Anxious off NIMVS Neuro: Follows commands, mae x 4 to voice HEENT:  No JVD/LAN Cardiovascular:  HSD RRR Lungs: Decreased air movement. No  overt wheeze Abdomen: Obese + bs Musculoskeletal:  Intact Skin:  Warm / Dry  LABS:  CBC  Recent Labs Lab 12/26/13 0641 12/27/13 0326  WBC 17.6* 9.0  HGB 12.7 10.6*  HCT 41.8 35.0*  PLT 264 241   Coag's  Recent Labs Lab 12/26/13 0930  APTT 29  INR 1.11   BMET  Recent Labs Lab 12/26/13 0641 12/27/13 0326  NA 142 140  K 4.0 4.0  CL 106 106  CO2 23 21  BUN 14 13  CREATININE 0.91 0.71  GLUCOSE 164* 115*   Electrolytes  Recent Labs Lab 12/26/13 0641 12/26/13 0930 12/27/13 0326  CALCIUM 8.8  --  8.9  MG  --  1.8  --   PHOS  --  3.9  --    Sepsis Markers  Recent Labs Lab 12/26/13 0654 12/26/13 0930 12/26/13 0935  LATICACIDVEN 1.47  --  2.3*  PROCALCITON  --  0.12  --    ABG  Recent Labs Lab 12/26/13 0726  PHART 7.302*  PCO2ART 52.9*  PO2ART 100.0   Liver Enzymes  Recent Labs Lab 12/26/13 0641  AST 19  ALT 17  ALKPHOS 126*  BILITOT 0.4  ALBUMIN 2.9*   Cardiac Enzymes  Recent Labs Lab 12/26/13 0641 12/27/13 0326  TROPONINI  --  <0.30  PROBNP 1952.0* 3762.0*  Glucose  Recent Labs Lab 12/26/13 1346 12/26/13 2014 12/26/13 2337 12/27/13 0422 12/27/13 0731  GLUCAP 162* 122* 121* 104* 107*    Imaging Dg Chest Portable 1 View  12/26/2013   CLINICAL DATA:  Respiratory distress  EXAM: PORTABLE CHEST - 1 VIEW  COMPARISON:  10/06/2013  FINDINGS: There is borderline cardiomegaly. Negative upper mediastinal contours for technique. Diffuse interstitial opacity with airspace densities at both bases. No definitive effusion. No pneumothorax.  Surgical clips in the left chest from lumpectomy.  IMPRESSION: Diffuse lung opacity, pattern favoring pulmonary edema over multi focal infection.   Electronically Signed   By: Jorje Guild M.D.   On: 12/26/2013 06:55     Intake/Output Summary (Last 24 hours) at 12/27/13 0932 Last data filed at 12/27/13 0600  Gross per 24 hour  Intake    250 ml  Output      0 ml  Net    250 ml     ASSESSMENT / PLAN:  PULMONARY  A: Hypercarbic/hypoxic resp failure acute on chronic. Possible pulmonary infection. ES COPD MO Continued decline over 3 years P:    NIMVS nocturnal and PRN. I time changes made along with decreased FIO2 8/18 0930. Try off NIMVS again. BD's Empiric abx Diuresis if bp allows No steroids unless bronchospastic If refractory to interventions family would want comfort as priority.   CARDIOVASCULAR CVL A:  Hypotension(on 3 antihypertensives), resolved CAD with hx of MI Elevated BNP P:  Holding antihypertensives Check Exhocardiogram  RENAL A:   No acute issue P:     GASTROINTESTINAL A:   No acute issue P:   PPI Begin diet as tolerated  HEMATOLOGIC A:   No acute issue P:    INFECTIOUS A:  Elevated WBC NHR Cannot rule out pulmonary infection. Treated for CAL in 5/15  P:   BCx2  8/7>> UC  8/17>> Sputum 8/17>> Abx: Cefepime, start date 8/17, day 1/x Abx: vanc, start date 8/17, day 1/x Low threshold to dc abx   ENDOCRINE A:   Hyperglycemia   P:   SSI  NEUROLOGIC A:   Hx of dementia and CVA. Anxiety P:   RASS goal: 1 Awake and follows simple commands. Careful use of anxiolytics/opiates unless for comfort care.   TODAY'S SUMMARY:  80 yo female, wheelchair bound with progressive decline over 3 years. Resident of SNF and admitted with AMS, fall from wheelchair coupled with increased hypoxia. She is a DNR/DNI per sisters. On NIMVS, CxR slightly better aeration. ? If BP will tolerate diuresis will try one dose 8/18.  Richardson Landry Minor ACNP Maryanna Shape PCCM Pager [phone] till 3 pm If no answer page [phone] 12/27/2013, 9:25 AM   PCCM ATTENDING: I have interviewed and examined the patient and reviewed the database. I have formulated the assessment and plan as reflected in the note above with amendments made by me.   Family updated in detail Repeat Lasix this afternoon Check Echocardiogram Follow CXR - if infiltrates  improved 8/19, will likely DC abx  Merton Border, MD;  PCCM service; [place] [phone]

## 2013-12-27 NOTE — Progress Notes (Signed)
  Echocardiogram 2D Echocardiogram has been performed.  Darlina Sicilian M 12/27/2013, 3:37 PM

## 2013-12-27 NOTE — Progress Notes (Signed)
IV lopressor given.

## 2013-12-27 NOTE — Progress Notes (Signed)
Pt c/o not being able to bx, decreased sats, increased HR&BP, NP/N, nursing will cont to monitor

## 2013-12-27 NOTE — Progress Notes (Signed)
Around 0450 patient began dropping SpO2 to 87% on 3L [place]. RN went into room to assess. Patient very anxious, calling out for help, stating she could not breath. RN encouraged patient to remain calm and take deep breaths, pulse ox dropped into the 70s and HR went into the 130s-160s. Placed on non-rebreather mask. Respiratory therapy and E-link notified. EKG performed. MD cameraed in. New orders received. Patient placed on BiPAP. Daughter notified.   Patient currently tolerating BiPAP. SpO2 98%. BP 158/79. HR 113. Daughter at bedside. Questions answered, emotional support given. Will continue to monitor.   Lum Babe, RN

## 2013-12-27 NOTE — Progress Notes (Signed)
Pt placed on Bipap due to increased WOB 

## 2013-12-27 NOTE — Consult Note (Addendum)
Patient BH:ALPFX Melinda Mora      DOB: [birth year]      TKW:[id number]     Consult Note from the Palliative Medicine Team at [facility] Requested by: Dr. Alva Garnet    PCP: Precious Reel, MD Reason for Consultation: Goals of care    Phone [phone] Related symptom recommendations Assessment of patients Current state: 80 yr old white female with advanced Golds III-IV COPD. Admitted with COPD exacerbation.  Patient has been unable to tolerate being off bipap for long periods of time.  Currently , patient has just had her third "panic " attack today which was witnessed by her daughter and sister.  I met with her daughter and sister.  Patient has dementia and currently is very confused per her family talking about a cousin who died a while back.  Patient's daughter recognizes that her mom can not make decisions for herself.  She sees that we might not be able to rescue [place] this time.  She is open to transitioning to comfort care if she does not turn around in the next day or so.  The family has called grandchildren and other family friends to visit.  We agreed that for tonight would continue with diuresis , bipap support and prn ativan and fentanyl.  We discussed what it could look like if she has worsening symptoms overnight or if we decided to move to full comfort with discontinuation off the bipap.  They understand the use and need for continuous opiates if needed.     Goals of Care: 1.  Code Status: DNR/ DNI   2. Scope of Treatment: Continue current medical management with the hope that she will turn the corner.  Family asks not to spare treatments that will promote comfort and safety. They understand that medications for anxiety and dyspnea can impair the patient's ability to fully recover, but they desire her to be comfortable.  4. Disposition: To be determined   3. Symptom Management:   1. Anxiety/Agitation: agree with current prn ativan 2. Pain/dyspnea: fentanyl may be  effective, consider Morphine if not giving desired affect  3. Bowel Regimen: monitor 4. Delirium : likely multifactorial- hypercarbic respiratory failure,dementia 5. COPD Exacerbation: aggressive pulmonary toilet.  Question to CCM would their be a role for steroids?   Lasix given for CHF. Reassess in the am.  6. Diastolic heart failure: Echo pending.  4. 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 Spiritual: [employer] faith, family would appreciate chaplain support.   Reviewed case with patient's primary doctor, Dr. Virgina Jock.  He affirmed the advanced stage of her illness and limited quality of life.       Patient Documents Completed or Given: Document Given Completed  Advanced Directives Pkt    MOST    DNR    Gone from My Sight    Hard Choices      Brief HPI: 80 yr old with advanced COPD who has been in SNF care for some time now.  She has been wheelchair bound since an ankle fracture in  2013.  We were asked to assist with GOC   ROS: Patient with limited ability to communicate due to bipap but reports being comfortable, without pain.    PMH:  Past Medical History  Diagnosis Date  . Hyperlipidemia   . Hypertension   . Depression   . Osteoarthritis   .  Allergic rhinitis   . Raynaud disease   . Adenomatous polyp 03/2004  . Diverticulosis   . Syncope Jan 2013    NSVT noted on event monitor  . COPD (chronic obstructive pulmonary disease) with emphysema   . Pulmonary nodule, right x2  middle and lower lobes--  stable per follow-ip ct  jan 2012  . S/P primary angioplasty with coronary stent   . History of acute inferior wall myocardial infarction SEPT 2008      S/P STENTING PROXIMAL TO MID RCA  . Endometrial hyperplasia   . History of breast cancer 2011--  ONCOLOGIST- DR Mariea Clonts    S/P LEFT PARTIAL MASTECTOMY AND RADIATION---  NO RECURRENCE  . Dyspnea   . Anxiety   . CAD  (coronary artery disease) CARDIOLOGIST-- DR Cristopher Peru--- LAST VISIT 08-16-2011  NOTE IN EPIC    Remote inferior MI in 2008. Extensive stenting of the RCA, s/p repeat stenting of the RCA in Dec 2010 due to restenosis  . Dry eyes, bilateral   . Heart attack   . Carotid stenosis, asymptomatic BILATERAL ICA --  0-39%  PER  DUPLEX 05-22-2011  . Clotting disorder   . Closed dislocation of right ankle, with complex fracture 03/16/2012  . Cancer     breast (lumpectomy on left breast 2 years ago)     PSH: Past Surgical History  Procedure Laterality Date  . Tympanostomy tube placement    . Colonoscopy  JULY 2013  . US echocardiography  05-22-2011    LVSF NORMAL/ EF 60-65%  . Left partial mastectomy w/ bx axillary lymph node   09-04-2009    INVASIVE MAMMARY CARCINOMA/  STAGE T1b NO  . Coronary angioplasty with stent placement  04-19-2009  DR PETER [place]    SINGLE VESSEL OBSTRUCTIVE DISEASE/  STENTING OF IN-STENT RESTENOSIS MID RCA/ NORMAL LVF  . Coronary angioplasty with stent placement  SEPT 2008--  [place]    STENTING PROXIMAL TO MID RCA  . Cardiovascular stress test  05-28-2011    LOW RISK STUDY  . Bilateral breast lumpectomy  BEFORE 2008  . Dilation and curettage of uterus  10/10/11  . Cataract extraction w/ intraocular lens  implant, bilateral    . Intrauterine device insertion    . Orif ankle fracture  03/16/2012    RIGHT ANKLE  . Orif ankle fracture  03/16/2012    Procedure: OPEN REDUCTION INTERNAL FIXATION (ORIF) ANKLE FRACTURE;  Surgeon: Johnny Bridge, MD;  Location: [place];  Service: Orthopedics;  Laterality: Right;   I have reviewed the [place] and SH and  If appropriate update it with new information. Allergies  Allergen Reactions  . Codeine Hives       . Penicillins Hives  . Shellfish Allergy Other (See Comments)    Absent reflexes   Scheduled Meds: . antiseptic oral rinse  7 mL Mouth Rinse q12n4p  . budesonide (PULMICORT) nebulizer solution  0.25 mg  Nebulization BID  . ceFEPime (MAXIPIME) IV  2 g Intravenous Q24H  . chlorhexidine  15 mL Mouth Rinse BID  . [START ON 12/28/2013] enoxaparin (LOVENOX) injection  40 mg Subcutaneous Q24H  . ipratropium-albuterol  3 mL Nebulization Q6H  . [START ON 12/28/2013] pantoprazole  40 mg Oral Q1200  . [START ON 12/28/2013] pneumococcal 23 valent vaccine  0.5 mL Intramuscular Tomorrow-1000  . potassium chloride  40 mEq Oral Once   Continuous Infusions: . sodium chloride 10 mL/hr at 12/27/13 1400   PRN Meds:.fentaNYL, LORazepam, metoprolol  BP 156/68  Pulse 102  Temp(Src) 98.4 F (36.9 C) (Axillary)  Resp 29  Ht 5' (1.524 m)  Wt 84.2 kg (185 lb 10 oz)  BMI 36.25 kg/m2  SpO2 96%   PPS: 20-30%   Intake/Output Summary (Last 24 hours) at 12/27/13 1542 Last data filed at 12/27/13 1400  Gross per 24 hour  Intake    450 ml  Output      0 ml  Net    450 ml    Physical Exam:  General: No acute distress on bipap HEENT:  PERRL, EOMi, anicteric, bipap in place Chest:   Decreased, with poor air entry despite bipap CVS: regular, S1, S2  Abdomen: obese, not tender or distended Ext: warm, no edema, no mottling Neuro:confused.  Labs: CBC    Component Value Date/Time   WBC 9.0 12/27/2013 0326   WBC 8.3 03/18/2011 1356   RBC 3.84* 12/27/2013 0326   RBC 4.78 03/18/2011 1356   HGB 10.6* 12/27/2013 0326   HGB 14.8 03/18/2011 1356   HCT 35.0* 12/27/2013 0326   HCT 43.8 03/18/2011 1356   PLT 241 12/27/2013 0326   PLT 268 03/18/2011 1356   MCV 91.1 12/27/2013 0326   MCV 91.7 03/18/2011 1356   MCH 27.6 12/27/2013 0326   MCH 30.9 03/18/2011 1356   MCHC 30.3 12/27/2013 0326   MCHC 33.7 03/18/2011 1356   RDW 17.0* 12/27/2013 0326   RDW 14.8* 03/18/2011 1356   LYMPHSABS 2.0 12/26/2013 0641   LYMPHSABS 1.9 03/18/2011 1356   MONOABS 0.7 12/26/2013 0641   MONOABS 0.5 03/18/2011 1356   EOSABS 0.3 12/26/2013 0641   EOSABS 0.1 03/18/2011 1356   BASOSABS 0.0 12/26/2013 0641   BASOSABS 0.1 03/18/2011 1356       CMP     Component Value Date/Time   NA 140 12/27/2013 0326   K 4.0 12/27/2013 0326   CL 106 12/27/2013 0326   CO2 21 12/27/2013 0326   GLUCOSE 115* 12/27/2013 0326   BUN 13 12/27/2013 0326   CREATININE 0.71 12/27/2013 0326   CALCIUM 8.9 12/27/2013 0326   PROT 6.3 12/26/2013 0641   ALBUMIN 2.9* 12/26/2013 0641   AST 19 12/26/2013 0641   ALT 17 12/26/2013 0641   ALKPHOS 126* 12/26/2013 0641   BILITOT 0.4 12/26/2013 0641   GFRNONAA 81* 12/27/2013 0326   GFRAA >90 12/27/2013 0326    Chest Xray Reviewed/Impressions:  Little change in probable moderate pulmonary vascular congestion  with cardiomegaly.       Time In Time Out Total Time Spent with Patient Total Overall Time  255 pm 340 pm 15 min 45 pm    Greater than 50%  of this time was spent counseling and coordinating care related to the above assessment and plan.  Nolberto Cheuvront L. Lovena Le, MD MBA The Palliative Medicine Team at [facility] Phone: [phone] Pager: [phone] ( Use team phone after hours)

## 2013-12-27 NOTE — Significant Event (Signed)
Called to bedside by RN.. 3 rd episode of acute O2 desaturation, anxiety, tachycardia. Witnessed by her sisters. Fentanyl ordered alone with palliative care consult. She is wearing out and we will address goals of comfort. Foley will be placed. BP 156/68  Pulse 102  Temp(Src) 98.4 F (36.9 C) (Axillary)  Resp 29  Ht 5' (1.524 m)  Wt 185 lb 10 oz (84.2 kg)  BMI 36.25 kg/m2  SpO2 98%  Recent Labs Lab 12/26/13 0641 12/27/13 0326  NA 142 140  K 4.0 4.0  CL 106 106  CO2 23 21  BUN 14 13  CREATININE 0.91 0.71  GLUCOSE 164* 115*    Recent Labs Lab 12/26/13 0641 12/27/13 0326  HGB 12.7 10.6*  HCT 41.8 35.0*  WBC 17.6* 9.0  PLT 264 241    Dg Chest Port 1 View  12/27/2013   CLINICAL DATA:  Followup respiratory failure  EXAM: PORTABLE CHEST - 1 VIEW  COMPARISON:  Portable chest x-ray of 12/26/2013  FINDINGS: There is little change in probable pulmonary vascular congestion and possible small effusions. Aeration may have improved minimally. Cardiomegaly is stable. No bony abnormality is seen.  IMPRESSION: Little change in probable moderate pulmonary vascular congestion with cardiomegaly.   Electronically Signed   By: Ivar Drape M.D.   On: 12/27/2013 08:05   Dg Chest Portable 1 View  12/26/2013   CLINICAL DATA:  Respiratory distress  EXAM: PORTABLE CHEST - 1 VIEW  COMPARISON:  10/06/2013  FINDINGS: There is borderline cardiomegaly. Negative upper mediastinal contours for technique. Diffuse interstitial opacity with airspace densities at both bases. No definitive effusion. No pneumothorax.  Surgical clips in the left chest from lumpectomy.  IMPRESSION: Diffuse lung opacity, pattern favoring pulmonary edema over multi focal infection.   Electronically Signed   By: Jorje Guild M.D.   On: 12/26/2013 06:55    Intake/Output Summary (Last 24 hours) at 12/27/13 1426 Last data filed at 12/27/13 1400  Gross per 24 hour  Intake    450 ml  Output      0 ml  Net    450 ml    Richardson Landry Dejae Bernet  ACNP [facility] Shape PCCM Pager [phone] till 3 pm If no answer page [phone] 12/27/2013, 2:27 PM

## 2013-12-28 ENCOUNTER — Inpatient Hospital Stay (HOSPITAL_COMMUNITY): Payer: Medicare Other

## 2013-12-28 DIAGNOSIS — J811 Chronic pulmonary edema: Secondary | ICD-10-CM

## 2013-12-28 DIAGNOSIS — J810 Acute pulmonary edema: Secondary | ICD-10-CM

## 2013-12-28 DIAGNOSIS — I4891 Unspecified atrial fibrillation: Secondary | ICD-10-CM

## 2013-12-28 LAB — BASIC METABOLIC PANEL
ANION GAP: 12 (ref 5–15)
BUN: 19 mg/dL (ref 6–23)
CO2: 27 mEq/L (ref 19–32)
CREATININE: 0.98 mg/dL (ref 0.50–1.10)
Calcium: 9.1 mg/dL (ref 8.4–10.5)
Chloride: 106 mEq/L (ref 96–112)
GFR calc Af Amer: 63 mL/min — ABNORMAL LOW (ref >90)
GFR, EST NON AFRICAN AMERICAN: 54 mL/min — AB (ref >90)
Glucose, Bld: 85 mg/dL (ref 70–99)
Potassium: 3.7 mEq/L (ref 3.7–5.3)
SODIUM: 145 meq/L (ref 137–147)

## 2013-12-28 LAB — PHOSPHORUS: PHOSPHORUS: 3.2 mg/dL (ref 2.3–4.6)

## 2013-12-28 LAB — MAGNESIUM: Magnesium: 2 mg/dL (ref 1.5–2.5)

## 2013-12-28 LAB — LEGIONELLA ANTIGEN, URINE: LEGIONELLA ANTIGEN, URINE: NEGATIVE

## 2013-12-28 LAB — PROCALCITONIN: Procalcitonin: 0.52 ng/mL

## 2013-12-28 LAB — PRO B NATRIURETIC PEPTIDE: Pro B Natriuretic peptide (BNP): 6008 pg/mL — ABNORMAL HIGH (ref 0–450)

## 2013-12-28 MED ORDER — MORPHINE SULFATE 2 MG/ML IJ SOLN
INTRAMUSCULAR | Status: AC
  Filled 2013-12-28: qty 1

## 2013-12-28 MED ORDER — AMIODARONE HCL IN DEXTROSE 360-4.14 MG/200ML-% IV SOLN
60.0000 mg/h | INTRAVENOUS | Status: AC
  Filled 2013-12-28: qty 200

## 2013-12-28 MED ORDER — FUROSEMIDE 10 MG/ML IJ SOLN
40.0000 mg | Freq: Once | INTRAMUSCULAR | Status: AC
  Administered 2013-12-28: 40 mg via INTRAVENOUS

## 2013-12-28 MED ORDER — MORPHINE SULFATE 2 MG/ML IJ SOLN: 2.0000 mg | INTRAMUSCULAR | Status: DC | PRN

## 2013-12-28 MED ORDER — MORPHINE SULFATE 2 MG/ML IJ SOLN
2.0000 mg | INTRAMUSCULAR | Status: DC | PRN
  Administered 2013-12-28 (×2): 2 mg via INTRAVENOUS
  Administered 2013-12-30: 4 mg via INTRAVENOUS
  Administered 2013-12-30 – 2013-12-31 (×5): 2 mg via INTRAVENOUS
  Filled 2013-12-28: qty 1
  Filled 2013-12-28: qty 2
  Filled 2013-12-28: qty 1
  Filled 2013-12-28: qty 2
  Filled 2013-12-28 (×2): qty 1

## 2013-12-28 MED ORDER — AMIODARONE HCL IN DEXTROSE 360-4.14 MG/200ML-% IV SOLN
30.0000 mg/h | INTRAVENOUS | Status: DC
  Administered 2013-12-28 – 2013-12-29 (×3): 30 mg/h via INTRAVENOUS
  Filled 2013-12-28 (×5): qty 200

## 2013-12-28 MED ORDER — DILTIAZEM HCL 100 MG IV SOLR
5.0000 mg/h | INTRAVENOUS | Status: DC
  Administered 2013-12-28: 5 mg/h via INTRAVENOUS
  Filled 2013-12-28: qty 100

## 2013-12-28 MED ORDER — AMIODARONE LOAD VIA INFUSION
150.0000 mg | Freq: Once | INTRAVENOUS | Status: AC
  Administered 2013-12-28: 150 mg via INTRAVENOUS
  Filled 2013-12-28: qty 83.34

## 2013-12-28 MED ORDER — POTASSIUM CHLORIDE 10 MEQ/100ML IV SOLN
10.0000 meq | INTRAVENOUS | Status: AC
  Administered 2013-12-28 (×3): 10 meq via INTRAVENOUS
  Filled 2013-12-28: qty 100

## 2013-12-28 MED ORDER — FUROSEMIDE 10 MG/ML IJ SOLN
INTRAMUSCULAR | Status: AC
  Filled 2013-12-28: qty 4

## 2013-12-28 MED ORDER — LORAZEPAM 2 MG/ML IJ SOLN
0.5000 mg | INTRAMUSCULAR | Status: DC | PRN
  Administered 2013-12-29 – 2013-12-31 (×3): 0.5 mg via INTRAVENOUS
  Filled 2013-12-28 (×3): qty 1

## 2013-12-28 MED ORDER — WHITE PETROLATUM GEL
Status: AC
  Filled 2013-12-28: qty 5

## 2013-12-28 NOTE — Progress Notes (Signed)
I have been following Case during hospitalization. I spoke c Dr Lovena Le yesterday afternoon. I have known pt for @ 7 years. It is amazing that she has done as well as she has over those years. I talked with daughter this am. I expressed that I did not believe that Melinda Mora has any reserve left to be able to recover to previously acceptable QOL. I expressed that Morphine gtt and Comfort Measures only is a very reasonable approach at this juncture. I answered questions. Once the decision to move to Comfort only has been made, I will assume care.

## 2013-12-28 NOTE — Progress Notes (Signed)
Chaplain responded to spiritual care consult for pt transitioning to palliative care. Pt's sister and daughter were present. Pt awake and alert and mentions eagerness to get some ice chips. Pt is light-hearted enough mood to say, "You're not going to bury me yet." Pt's daughter and sister says they are seeking God's guidance as to best care for pt. They asked for prayer. We joined around pt at bedside and prayed for pt and family and medical staff. Pt's daughter expressed appreciation for the visit and asked how to get in touch if they need [name] further. I assured her of our continuing availability.

## 2013-12-28 NOTE — Progress Notes (Signed)
[facility] Progress Note Patient Name: Melinda Mora DOB: 06/29/1933 MRN: [id number]   Date of Service  12/28/2013  HPI/Events of Note  A fib rvr, HR 140s  eICU Interventions  cardizem infusion     Intervention Category Major Interventions: Arrhythmia - evaluation and management  [name] 12/28/2013, 2:51 AM

## 2013-12-28 NOTE — Progress Notes (Addendum)
Patient WK:MQKMM B Burkhead      DOB: Feb 14, 1934      NOT:[id number]   Palliative Medicine Team at [facility] Progress Note    Subjective: Met with patient and family several times this am.  Patient off bipap tolerating without increased work of breathing.  Patient states "I'm going to beat this and I not ready for the grave yet". Gets anxious when talking about her illness, so we have limited long discussions with her. She stated to her sister she has things to do which she finally identified as seeing her niece's daughter Francoise Ceo.  Izzie came and they had a wonderful visit.  At this time, family understands the need for lab draws but did ask to stop  The lovenox injections which has been done by CCM.  Family realizes that we are still in the woods and that they may still be faced with not leaving the hospital.     Filed Vitals:   12/28/13 1205  BP:   Pulse:   Temp: 97.7 F (36.5 C)  Resp: 22   Physical exam:  General: no acute distress at present PERRL, EOMI, anicteric,mmm Chest decreased air entry anteriorly CVS: irregular, s1, s@ Abd obese, not tender or distended Ext: Warm, no edema Neuro: awake alert oriented to self and family , insight into illness is very poor  Assessment and plan: 80 yr old white female with advanced COPD presents with exacerbation overlayed with CHF and afib with RVR.  Patient more awake and alert today stating "I want to live"..  Family walking the line of desire for complete comfort.  If she deteriorates off bipap would have low threshold to communicate with family regarding the need for continuous opiates.  Patient without full judgement and insight into severity of illness. She has been confused during this admission.  1.  DNR  2.  Agree with DC lovenox for the purpose of comfort. Family aware and accepting of risks  3.  Agree with prn morphine.  Would have low tolerance for continuous opiates if needed for comfort. Could bridge comfort with bipap and  remove once stabilized  4.  Would ask speech to clear diet.  5.  Afib: agree with treating may have exacerbated COPD.  6.  Anxiety: prn ativan Total time:  110- 140 pm  Sevyn Paredez L. Lovena Le, MD MBA The Palliative Medicine Team at [facility] Phone: [phone] Pager: [phone] ( Use team phone after hours)

## 2013-12-28 NOTE — Progress Notes (Addendum)
Nutrition Brief Note  Pt flagged for Low Braden Score. Spoke with RN. Pt's family is considering comfort measures.  Please consult RD as needed.   Terrace Arabia RD, LDN

## 2013-12-28 NOTE — Progress Notes (Signed)
PULMONARY / CRITICAL CARE MEDICINE   Name: SARAJEAN DESSERT MRN: [id number] DOB: 10/05/1933    ADMISSION DATE:  12/26/2013   REFERRING MD :  EDP  CHIEF COMPLAINT:  SOB  INITIAL PRESENTATION: Hypotensive, hypoxic, hypercarbic. Edema pattern on CXR. NPPV initiated in ED    SIGNIFICANT EVENTS/STUDIES: 8/18 Rough day with severe resp distress off BiPAP.  8/18 Echocardiogram: LVEF 55-60%. Evidence of diastolic dysfunction - not graded 8/18 Palliative Care consultation obtained for goals of care 8/19 Paroxysmal AFRVR. Diltiazem gtt ordered 8/19 AFRVR <> sinus with PACs. Rate poorly controlled when in AF. Amiodarone initiated. PRN morphine ordered. Further diuresis with Lasix ordered  SUBJECTIVE:  Comfortable on NPPV. RASS 0. + F/C  VITAL SIGNS: Temp:  [97.3 F (36.3 C)-99.2 F (37.3 C)] 97.3 F (36.3 C) (08/19 0700) Pulse Rate:  [50-152] 123 (08/19 0830) Resp:  [19-49] 22 (08/19 0830) BP: (108-239)/(60-160) 111/76 mmHg (08/19 0830) SpO2:  [85 %-100 %] 94 % (08/19 0830) FiO2 (%):  [50 %] 50 % (08/19 0812) Weight:  [83.9 kg (184 lb 15.5 oz)] 83.9 kg (184 lb 15.5 oz) (08/19 0500) HEMODYNAMICS:   VENTILATOR SETTINGS: Vent Mode:  [-]  FiO2 (%):  [50 %] 50 % INTAKE / OUTPUT:  Intake/Output Summary (Last 24 hours) at 12/28/13 1051 Last data filed at 12/28/13 0700  Gross per 24 hour  Intake 465.75 ml  Output   1275 ml  Net -809.25 ml    PHYSICAL EXAMINATION: General:  NAD on NPPV. RASS 0 Neuro: no focal deficits HEENT:  No JVD/LAN Cardiovascular:  HSD RRR Lungs: bibasilar crackles, no wheezes Abdomen: Obese + bs Ext: no edema, cool  LABS: I have reviewed all of today's lab results. Relevant abnormalities are discussed in the A/P section   ASSESSMENT / PLAN:  PULMONARY  A: Hypercarbic/hypoxic resp failure acute on chronic. Doubt infectious etiology ES COPD P:   Cont PRN NPPV Cont supp O2 PRN morphine ordered Cont diuresis COnt BD's and nebulized steroids DC  empiric abx  CARDIOVASCULAR  A:  Hypotension, resolved CAD with hx of MI Elevated BNP Paroxysmal AFRVR P:  Holding antihypertensives Cont dilt gtt Begin amiodarone 8/19  RENAL A:   No acute issue P:   Monitor BMET intermittently Monitor I/Os Correct electrolytes as indicated   GASTROINTESTINAL A:   No acute issue P:   PPI Cont diet as tolerated  HEMATOLOGIC A:   No acute issue P:  DVT px: SCDs Monitor CBC intermittently Transfuse per usual ICU guidelines  INFECTIOUS A:  Elevated WBC Doubt acute infectious process P:   BCx2  8/7 >>   Cefepime 8/17 >> 8/19 Vanc 8/17 >> 8/18  ENDOCRINE A:   Hyperglycemia, resolved P:   Monitor glu on chem panels Consider SSI if glu > 200  NEUROLOGIC A:   Hx of dementia and CVA. Anxiety Adult FTT P:   Palliative care input appreciated PRN morphine ordered Goals of care discussed again with daughters   TODAY'S SUMMARY:    Merton Border, MD ; [facility] service Mobile [phone].  After 5:30 PM or weekends, call [phone]   12/28/2013, 10:51 AM

## 2013-12-28 NOTE — Progress Notes (Signed)
Pt was off Bipap and on 4 LPM nasal cannula upon arrival. Pt is tolerating well at this time. No distress noted. Pt vitals are stable. RT made pt aware that if she became SOB or had any distress to call.

## 2013-12-29 ENCOUNTER — Inpatient Hospital Stay (HOSPITAL_COMMUNITY): Payer: Medicare Other

## 2013-12-29 DIAGNOSIS — I509 Heart failure, unspecified: Secondary | ICD-10-CM

## 2013-12-29 DIAGNOSIS — I5033 Acute on chronic diastolic (congestive) heart failure: Principal | ICD-10-CM

## 2013-12-29 DIAGNOSIS — J441 Chronic obstructive pulmonary disease with (acute) exacerbation: Secondary | ICD-10-CM

## 2013-12-29 LAB — BASIC METABOLIC PANEL
ANION GAP: 14 (ref 5–15)
BUN: 14 mg/dL (ref 6–23)
CALCIUM: 9 mg/dL (ref 8.4–10.5)
CO2: 26 mEq/L (ref 19–32)
CREATININE: 0.77 mg/dL (ref 0.50–1.10)
Chloride: 102 mEq/L (ref 96–112)
GFR, EST NON AFRICAN AMERICAN: 79 mL/min — AB (ref >90)
Glucose, Bld: 111 mg/dL — ABNORMAL HIGH (ref 70–99)
Potassium: 3.6 mEq/L — ABNORMAL LOW (ref 3.7–5.3)
SODIUM: 142 meq/L (ref 137–147)

## 2013-12-29 LAB — URINE CULTURE
CULTURE: NO GROWTH
Colony Count: NO GROWTH

## 2013-12-29 MED ORDER — POTASSIUM CHLORIDE CRYS ER 20 MEQ PO TBCR
40.0000 meq | EXTENDED_RELEASE_TABLET | Freq: Once | ORAL | Status: AC
  Administered 2013-12-29: 40 meq via ORAL
  Filled 2013-12-29: qty 2

## 2013-12-29 MED ORDER — POTASSIUM CHLORIDE CRYS ER 20 MEQ PO TBCR: 40.0000 meq | EXTENDED_RELEASE_TABLET | Freq: Once | ORAL | Status: DC

## 2013-12-29 MED ORDER — ALBUTEROL SULFATE (2.5 MG/3ML) 0.083% IN NEBU
2.5000 mg | INHALATION_SOLUTION | RESPIRATORY_TRACT | Status: DC | PRN
  Filled 2013-12-29: qty 3

## 2013-12-29 MED ORDER — FUROSEMIDE 10 MG/ML IJ SOLN
40.0000 mg | Freq: Once | INTRAMUSCULAR | Status: AC
  Administered 2013-12-29: 40 mg via INTRAVENOUS

## 2013-12-29 MED ORDER — AMIODARONE HCL 200 MG PO TABS
400.0000 mg | ORAL_TABLET | Freq: Two times a day (BID) | ORAL | Status: DC
  Administered 2013-12-29 – 2014-01-02 (×7): 400 mg via ORAL
  Filled 2013-12-29 (×12): qty 2

## 2013-12-29 NOTE — Progress Notes (Signed)
Patient VO:[id number] B Shadd      DOB: 11/19/1933      FGH:[id number]   Palliative Medicine Team at [facility] Progress Note    Subjective:  Met with Melinda Mora and her Daughter. Dream is progressing well. She is now on [place].  During sleep she remains with significant accessory muscle use of her belly muscles to breath.  She states she is doing well.  I asked her what she was hearing from the [place]. She stated that her lungs were not doing well and she was surprised at that diagnosis and prognosis.  Her daughter related that Melinda Mora had asked if she would have this trouble breathing.  I tried to gently explain that her lungs would not get much better and that her heart was now bothering her breathing as well. We tried to keep this up beat.  She related that of course that she wanted to live to be with her grand niece Melinda Mora and and two grandsons.  We were honest with her that this was a life limiting illness and that things may be changing to the point were we may need to focus on her two goals but also be realistic about how to rescue her if she does not progress or gets exacerbated again.  I stop short of introducing the concept of hospice as I don't believe that she was quire ready to hear that yet .  Her daughter is aware of this option.  She was so happy to be able to eat even if it was [place].     Filed Vitals:   12/29/13 0800  BP: 144/69  Pulse: 75  Temp: 97.8 F (36.6 C)  Resp: 20   Physical exam:  General : no acute distress PERRL, EOMI, anicteric, mmm Chest decreased with no wheezing, no rhonchi CVS: irreg, S1, S2 ABd Obese , soft not tender Ext warm, no edema Neuro: Awake, alert oriented, CN II-XII intact  Assessment and plan: 80 yr old with Stage VI COPD admitted with respiratory failure requiring bipap. Course complicated by afib with RVR and Diastolic heart failure.  1.  DNR/ DNI.   Will need to address patient's desire to use bipap during future exacerbations. Timing was not right  to get into this topic but I believe that we have laid the ground work with her memory will allow continuation of the conversation that we had today. I was honest with her and said that while we want her to be able to live a full life, we have to face that this illness will cause her death at some time.  I asked her to talk with her daughter and her doctors about what that part of the journey should look like.  She was able to hear that without too much distress or anxiety and stated she would think about it. She likened her situation to a friend that had COPD and so we tried to draw on the meaning that her friends journey was having with her time now.    2.  CHF- rate control afib and diuresis per primary service  3.  Afib: agree with amiodarone.   Total Time:  100-145   Melinda Dara L. Lovena Le, MD MBA The Palliative Medicine Team at [facility] Phone: [phone] Pager: [phone] ( Use team phone after hours)

## 2013-12-29 NOTE — Progress Notes (Signed)
Utilization review completed.  

## 2013-12-29 NOTE — Progress Notes (Signed)
Occupational Therapy Evaluation Patient Details Name: Melinda Mora MRN: [id number] DOB: Aug 16, 1933 Today's Date: 12/29/2013    History of Present Illness Melinda Mora is a 80 year old white female with a history of COPD, coronary artery disease status post PCI who presents to the emergency department with complaint of shortness of breath.  Chest x-ray shows possible bilateral pneumonia.    Clinical Impression   PTA, pt at [facility] for rehab. Until @ 2 months ago, pt lived in [place] at [employer] and had assist as needed for ADL. Discussed plan of care with daughter and pt, who states that they want to participate with OT as tolerated. Pt states she would like to be able to feed herself and at this time she is so weak, that this is difficult for her to complete. Pt will need to return to [facility] for rehab. Will follow acutely to focus on increasing independence with basic self care to increase quality of life goals. Will attempt OOB to recliner tomorrow as pt tolerates.     Follow Up Recommendations  SNF;Supervision/Assistance - 24 hour    Equipment Recommendations  None recommended by OT    Recommendations for Other Services       Precautions / Restrictions Precautions Precautions: Fall;Other (comment) (very thin skin. at risk for skin tears) Restrictions Weight Bearing Restrictions: No      Mobility Bed Mobility Overal bed mobility: Needs Assistance Bed Mobility: Rolling Rolling: Mod assist         General bed mobility comments: Pt able to assist with bed mobility but desats to 90 O2 5L with rolling.  Transfers                 General transfer comment: not attempted today.    Balance                                            ADL Overall ADL's : Needs assistance/impaired                                     Functional mobility during ADLs:  (+2 ) General ADL Comments: total A at this time. Extremely limited by  respiratory status and fatigue     Vision                     Perception     Praxis      Pertinent Vitals/Pain Pain Assessment: No/denies pain     Hand Dominance Right   Extremity/Trunk Assessment Upper Extremity Assessment Upper Extremity Assessment: Generalized weakness (RUE stronger than left. Appears to have L shoulder weakness.)   Lower Extremity Assessment Lower Extremity Assessment: Generalized weakness   Cervical / Trunk Assessment Cervical / Trunk Assessment: Normal   Communication Communication Communication: No difficulties   Cognition Arousal/Alertness: Awake/alert Behavior During Therapy: WFL for tasks assessed/performed Overall Cognitive Status: History of cognitive impairments - at baseline                     General Comments   O2 sats 93 5L upright in bed SOB with assessing BUE ROM    Exercises Exercises: Other exercises Other Exercises Other Exercises: Educated daughter on bed level exercises with BUE below heart level.  Other Exercises: Educated daugher/pt on importance of keeping  BUE elevated on 2 pillows and complete composite flexion to decrease dependent edema. Other Exercises: Educated on ankle pumps and floating B heels to prevent prressure areas   Shoulder Instructions      Home Living Family/patient expects to be discharged to:: Skilled nursing facility                                        Prior Functioning/Environment Level of Independence: Needs assistance  Gait / Transfers Assistance Needed: staff assisted with transfers. used w/c. walking short distances ADL's / Homemaking Assistance Needed: staff assisted with ADL as needed        OT Diagnosis: Generalized weakness   OT Problem List: Decreased strength;Decreased activity tolerance;Cardiopulmonary status limiting activity;Obesity;Impaired UE functional use;Increased edema   OT Treatment/Interventions: Self-care/ADL training;Therapeutic  exercise;DME and/or AE instruction;Therapeutic activities;Patient/family education;Balance training    OT Goals(Current goals can be found in the care plan section) Acute Rehab OT Goals Patient Stated Goal: to get stronger OT Goal Formulation: With patient/family Time For Goal Achievement: 01/12/14 Potential to Achieve Goals: Good  OT Frequency: Min 2X/week   Barriers to D/C:            Co-evaluation              End of Session Equipment Utilized During Treatment: Oxygen (5L) Nurse Communication: Mobility status  Activity Tolerance: Patient limited by fatigue Patient left: in bed;with call bell/phone within reach;with family/visitor present   Time: 7829-5621 OT Time Calculation (min): 18 min Charges:  OT General Charges $OT Visit: 1 Procedure OT Evaluation $Initial OT Evaluation Tier I: 1 Procedure OT Treatments $Therapeutic Activity: 8-22 mins G-Codes:    Cashawn Yanko,HILLARY 01/16/14, 4:03 PM   [facility], OTR/L  [phone] 2014-01-16

## 2013-12-29 NOTE — Progress Notes (Signed)
PULMONARY / CRITICAL CARE MEDICINE   Name: Melinda Mora MRN: [id number] DOB: 25-Jul-1933    ADMISSION DATE:  12/26/2013   REFERRING MD :  EDP  CHIEF COMPLAINT:  SOB  INITIAL PRESENTATION: Hypotensive, hypoxic, hypercarbic. Edema pattern on CXR. NPPV initiated in ED    SIGNIFICANT EVENTS/STUDIES: 8/18 Rough day with severe resp distress off BiPAP.  8/18 Echocardiogram: LVEF 55-60%. Evidence of diastolic dysfunction - not graded 8/18 Palliative Care consultation obtained for goals of care 8/19 Paroxysmal AFRVR. Diltiazem gtt ordered 8/19 AFRVR <> sinus with PACs. Rate poorly controlled when in AF. Amiodarone initiated. PRN morphine ordered. Further diuresis with Lasix ordered 8/20 in nsr on amio drip  SUBJECTIVE:  More interactive , off bipap  VITAL SIGNS: Temp:  [97.7 F (36.5 C)-98.7 F (37.1 C)] 97.8 F (36.6 C) (08/20 0700) Pulse Rate:  [67-144] 72 (08/20 0419) Resp:  [18-25] 18 (08/20 0419) BP: (111-143)/(48-85) 143/65 mmHg (08/20 0419) SpO2:  [84 %-96 %] 96 % (08/20 0730) FiO2 (%):  [40 %-50 %] 45 % (08/20 0730) Weight:  [188 lb 0.8 oz (85.3 kg)] 188 lb 0.8 oz (85.3 kg) (08/20 0419) HEMODYNAMICS:   VENTILATOR SETTINGS: Vent Mode:  [-]  FiO2 (%):  [40 %-50 %] 45 % INTAKE / OUTPUT:  Intake/Output Summary (Last 24 hours) at 12/29/13 3976 Last data filed at 12/29/13 0700  Gross per 24 hour  Intake 427.25 ml  Output   1700 ml  Net -1272.75 ml    PHYSICAL EXAMINATION: General:  NAD on nasal cannula Neuro: no focal deficits, more interactive HEENT:  No JVD/LAN Cardiovascular:  HSD RRR Lungs: faint bibasilar crackles, no wheezes Abdomen: Obese + bs Ext: no edema, cool  LABS: I have reviewed all of today's lab results. Relevant abnormalities are discussed in the A/P section   ASSESSMENT / PLAN:  PULMONARY  A: Hypercarbic/hypoxic resp failure acute on chronic. Doubt infectious etiology ES COPD P:   Cont PRN NPPV Cont supp O2 PRN morphine  ordered Cont diuresis Cont BD's and nebulized steroids DC'd empiric abx 8/19  CARDIOVASCULAR  A:  Hypotension, resolved CAD with hx of MI Elevated BNP Paroxysmal AFRVR > NSR P:  Holding antihypertensives Change amio to PO - cont load  RENAL A:   No acute issue P:   Monitor BMET intermittently Monitor I/Os Correct electrolytes as indicated   GASTROINTESTINAL A:   No acute issue P:   PPI Cont diet as tolerated  HEMATOLOGIC A:   No acute issue P:  DVT px: SCDs Monitor CBC intermittently Transfuse per usual ICU guidelines  INFECTIOUS A:  Elevated WBC Doubt acute infectious process P:   BCx2  8/7 >>  UC 8.17 n>>neg Cefepime 8/17 >> 8/19 Vanc 8/17 >> 8/18  ENDOCRINE A:   Hyperglycemia, resolved P:   Monitor glu on chem panels Consider SSI if glu > 200  NEUROLOGIC A:   Hx of dementia and CVA. Anxiety Adult FTT P:   Palliative care input appreciated PRN morphine ordered Goals of care discussed again with daughters   TODAY'S SUMMARY:  More interactive. Neg i/o. Repleted K+. Repeat lasix x 1 today.   Richardson Landry Minor ACNP Maryanna Shape PCCM Pager [phone] till 3 pm If no answer page [phone] 12/29/2013, 8:25 AM   PCCM ATTENDING Looks much more comfortable. In good spirits. CXR somewhat improved.  Watch in SDU X 24 hrs more Change amiodarone to PO - continue load Advance diet Advance activity as tolerated Input from Palliative Care and Dr Virgina Jock much  appreciated  Merton Border, MD ; [facility] service Mobile [phone].  After 5:30 PM or weekends, call [phone]

## 2013-12-30 DIAGNOSIS — K5900 Constipation, unspecified: Secondary | ICD-10-CM

## 2013-12-30 DIAGNOSIS — R0989 Other specified symptoms and signs involving the circulatory and respiratory systems: Secondary | ICD-10-CM

## 2013-12-30 DIAGNOSIS — R0609 Other forms of dyspnea: Secondary | ICD-10-CM

## 2013-12-30 LAB — BASIC METABOLIC PANEL
ANION GAP: 11 (ref 5–15)
BUN: 13 mg/dL (ref 6–23)
CALCIUM: 9.3 mg/dL (ref 8.4–10.5)
CO2: 29 mEq/L (ref 19–32)
CREATININE: 0.89 mg/dL (ref 0.50–1.10)
Chloride: 104 mEq/L (ref 96–112)
GFR, EST AFRICAN AMERICAN: 71 mL/min — AB (ref >90)
GFR, EST NON AFRICAN AMERICAN: 61 mL/min — AB (ref >90)
Glucose, Bld: 116 mg/dL — ABNORMAL HIGH (ref 70–99)
Potassium: 4 mEq/L (ref 3.7–5.3)
SODIUM: 144 meq/L (ref 137–147)

## 2013-12-30 MED ORDER — ATORVASTATIN CALCIUM 40 MG PO TABS
40.0000 mg | ORAL_TABLET | Freq: Every day | ORAL | Status: DC
  Filled 2013-12-30 (×2): qty 1

## 2013-12-30 MED ORDER — METOPROLOL SUCCINATE ER 50 MG PO TB24
50.0000 mg | ORAL_TABLET | Freq: Every day | ORAL | Status: DC
  Administered 2013-12-30 – 2013-12-31 (×2): 50 mg via ORAL
  Filled 2013-12-30 (×5): qty 1

## 2013-12-30 MED ORDER — HYDROCODONE-ACETAMINOPHEN 7.5-325 MG/15ML PO SOLN
10.0000 mL | Freq: Four times a day (QID) | ORAL | Status: DC | PRN
Start: 2013-12-30 — End: 2013-12-31

## 2013-12-30 MED ORDER — ASPIRIN 81 MG PO CHEW
81.0000 mg | CHEWABLE_TABLET | Freq: Every day | ORAL | Status: DC
  Administered 2013-12-30: 81 mg via ORAL
  Filled 2013-12-30: qty 1

## 2013-12-30 MED ORDER — ALPRAZOLAM 0.25 MG PO TABS
0.2500 mg | ORAL_TABLET | Freq: Three times a day (TID) | ORAL | Status: DC | PRN
  Administered 2014-01-02 – 2014-01-03 (×2): 0.25 mg via ORAL
  Filled 2013-12-30 (×3): qty 1

## 2013-12-30 MED ORDER — DONEPEZIL HCL 5 MG PO TABS
5.0000 mg | ORAL_TABLET | Freq: Every day | ORAL | Status: DC
  Filled 2013-12-30 (×2): qty 1

## 2013-12-30 MED ORDER — TIOTROPIUM BROMIDE MONOHYDRATE 18 MCG IN CAPS
18.0000 ug | ORAL_CAPSULE | Freq: Every day | RESPIRATORY_TRACT | Status: DC
  Filled 2013-12-30: qty 5

## 2013-12-30 MED ORDER — CLOPIDOGREL BISULFATE 75 MG PO TABS
75.0000 mg | ORAL_TABLET | Freq: Every day | ORAL | Status: DC
  Administered 2013-12-30: 75 mg via ORAL
  Filled 2013-12-30 (×2): qty 1

## 2013-12-30 MED ORDER — ESCITALOPRAM OXALATE 20 MG PO TABS
20.0000 mg | ORAL_TABLET | Freq: Every day | ORAL | Status: DC
  Filled 2013-12-30 (×2): qty 1

## 2013-12-30 MED ORDER — SENNOSIDES-DOCUSATE SODIUM 8.6-50 MG PO TABS
1.0000 | ORAL_TABLET | Freq: Every day | ORAL | Status: DC
  Administered 2013-12-31 – 2014-01-02 (×3): 1 via ORAL
  Filled 2013-12-30 (×5): qty 1

## 2013-12-30 MED ORDER — BUDESONIDE-FORMOTEROL FUMARATE 160-4.5 MCG/ACT IN AERO
2.0000 | INHALATION_SPRAY | Freq: Two times a day (BID) | RESPIRATORY_TRACT | Status: DC
  Filled 2013-12-30: qty 6

## 2013-12-30 NOTE — Progress Notes (Signed)
Pt increasingly confused and agitated, refusing full skin assessment/pericare this AM. Stating that her "butt is private!" Daughter at bedside tried explaining along with nursing staff but pt still adamantly refused. Informed daughter that will will continue to make attempts throughout the day, voiced understanding. Foley care performed but unable to visualize bottom or change bedding

## 2013-12-30 NOTE — Evaluation (Signed)
Physical Therapy Evaluation Patient Details Name: Melinda Mora MRN: [id number] DOB: 1934/02/26 Today's Date: 12/30/2013   History of Present Illness  Ms. Godsil is a 80 year old white female with a history of COPD, coronary artery disease status post PCI who presents to the emergency department with complaint of shortness of breath.  Chest x-ray shows possible bilateral pneumonia.   Clinical Impression  Pt agreeable to mobility, but limited by feeling easily fatigued.  Pt's O2 sats remained 91-94% on 6L O2.  Will continue to follow along to decrease burden of care prior to returning to SNF.      Follow Up Recommendations SNF    Equipment Recommendations  None recommended by PT    Recommendations for Other Services       Precautions / Restrictions Precautions Precautions: Fall Precaution Comments: pt with thin skin and tears easily.   Restrictions Weight Bearing Restrictions: No      Mobility  Bed Mobility Overal bed mobility: Needs Assistance Bed Mobility: Supine to Sit;Sit to Supine     Supine to sit: Mod assist;+2 for physical assistance Sit to supine: Max assist;+2 for physical assistance   General bed mobility comments: cues for sequencing and encouragement.    Transfers Overall transfer level: Needs assistance Equipment used: 2 person hand held assist Transfers: Sit to/from Stand Sit to Stand: Mod assist;+2 physical assistance         General transfer comment: pt needs Bil feet blocked as she tends to slide feet forward and leans posteriorly.    Ambulation/Gait                Stairs            Wheelchair Mobility    Modified Rankin (Stroke Patients Only)       Balance Overall balance assessment: Needs assistance Sitting-balance support: Bilateral upper extremity supported;Feet supported Sitting balance-Leahy Scale: Poor Sitting balance - Comments: pt tends to lean to L side and requires between Min and ModA to maintain balance.    Postural control: Left lateral lean Standing balance support: During functional activity Standing balance-Leahy Scale: Zero                               Pertinent Vitals/Pain Pain Assessment: No/denies pain    Home Living Family/patient expects to be discharged to:: Skilled nursing facility                      Prior Function Level of Independence: Needs assistance   Gait / Transfers Assistance Needed: staff assisted with transfers. used w/c. walking short distances  ADL's / Homemaking Assistance Needed: staff assisted with ADL as needed        Hand Dominance   Dominant Hand: Right    Extremity/Trunk Assessment   Upper Extremity Assessment: Defer to OT evaluation           Lower Extremity Assessment: Generalized weakness         Communication   Communication: No difficulties  Cognition Arousal/Alertness: Awake/alert Behavior During Therapy: WFL for tasks assessed/performed Overall Cognitive Status: History of cognitive impairments - at baseline                      General Comments      Exercises        Assessment/Plan    PT Assessment Patient needs continued PT services  PT Diagnosis Difficulty walking;Generalized weakness  PT Problem List Decreased strength;Decreased activity tolerance;Decreased balance;Decreased mobility;Decreased knowledge of use of DME;Cardiopulmonary status limiting activity  PT Treatment Interventions DME instruction;Gait training;Functional mobility training;Therapeutic activities;Therapeutic exercise;Balance training;Patient/family education   PT Goals (Current goals can be found in the Care Plan section) Acute Rehab PT Goals Patient Stated Goal: to get stronger PT Goal Formulation: With patient Time For Goal Achievement: 01/13/14 Potential to Achieve Goals: Fair    Frequency Min 2X/week   Barriers to discharge        Co-evaluation               End of Session Equipment  Utilized During Treatment: Gait belt;Oxygen Activity Tolerance: Patient limited by fatigue Patient left: in bed;with call bell/phone within reach;with family/visitor present Nurse Communication: Mobility status         Time: 2536-6440 PT Time Calculation (min): 19 min   Charges:   PT Evaluation $Initial PT Evaluation Tier I: 1 Procedure PT Treatments $Therapeutic Activity: 8-22 mins   PT G CodesCatarina Hartshorn, [place] 12/30/2013, 2:48 PM

## 2013-12-30 NOTE — Significant Event (Signed)
Rapid Response Event Note  Overview: Time Called: 1629 Arrival Time: 1630 Event Type: Respiratory  Initial Focused Assessment:  Called by primary RN for patient with sudden respiratory distress.  Oxygen sats 70's on nasal cannula, placed on NRB, sats increased to mid 90's.  Patient is diaphoretic, sitting up in bed.  RR 38, using accessory muscles. HR 150's    Interventions:  Placed on NRB, gave morphine and ativan.  Spoke with Dr. Anne Fu is comfort.  Spoke with family who have been discussing what the goals should be.  Family asked patient if she wanted to be put back on bipap machine and patient stated no.  HR 105, RR 33 still with some accessory muscle use, Sats 95% on NR BP 155/122.   Event Summary:  Family updated and we discussed options-- family thinking it through.     at      at          [facility] [place] [place]

## 2013-12-30 NOTE — Progress Notes (Signed)
PULMONARY / CRITICAL CARE MEDICINE   Name: Melinda Mora MRN: [id number] DOB: [birth year]    ADMISSION DATE:  12/26/2013   REFERRING MD :  EDP  CHIEF COMPLAINT:  SOB  INITIAL PRESENTATION: Hypotensive, hypoxic, hypercarbic. Edema pattern on CXR. NPPV initiated in ED    SIGNIFICANT EVENTS/STUDIES: 8/18 Rough day with severe resp distress off BiPAP.  8/18 Echocardiogram: LVEF 55-60%. Evidence of diastolic dysfunction - not graded 8/18 Palliative Care consultation obtained for goals of care 8/19 Paroxysmal AFRVR. Diltiazem gtt ordered 8/19 AFRVR <> sinus with PACs. Rate poorly controlled when in AF. Amiodarone initiated. PRN morphine ordered. Further diuresis with Lasix ordered 8/20 in NSR on amio drip. Transitioned to PO 8/21 Much improved. Transferred to telemetry  SUBJECTIVE:  No new complaints. NAD  VITAL SIGNS: Temp:  [97.5 F (36.4 C)-98.1 F (36.7 C)] 98.1 F (36.7 C) (08/21 0756) Pulse Rate:  [90-98] 98 (08/21 0400) Resp:  [21-26] 21 (08/21 0400) BP: (121-133)/(52-74) 121/74 mmHg (08/21 0400) SpO2:  [91 %-95 %] 93 % (08/21 0756) Weight:  [80.604 kg (177 lb 11.2 oz)] 80.604 kg (177 lb 11.2 oz) (08/21 0529) HEMODYNAMICS:   VENTILATOR SETTINGS:   INTAKE / OUTPUT:  Intake/Output Summary (Last 24 hours) at 12/30/13 1317 Last data filed at 12/30/13 0830  Gross per 24 hour  Intake    360 ml  Output   2025 ml  Net  -1665 ml    PHYSICAL EXAMINATION: General:  NAD on nasal cannula Neuro: no focal deficits, more interactive HEENT:  No JVD/LAN Cardiovascular:  HSD RRR Lungs: faint bibasilar crackles, no wheezes Abdomen: Obese + bs Ext: no edema, cool  LABS: I have reviewed all of today's lab results. Relevant abnormalities are discussed in the A/P section  CXR: NNF  ASSESSMENT / PLAN:  PULMONARY  A: Acute hypercarbic/hypoxic resp failure, resolved Chronic resp failure due to COPD P:   Cont supp O2 Cont PRN morphine ordered Change nebs to  inhalers  CARDIOVASCULAR  A:  Hypotension, resolved Hx of MI Chronic diastolic CHF with elevated BNP Paroxysmal AFRVR > NSR P:  Holding losartan and HCTZ Resume metoprolol 8/21 Cont PO amio load Transfer to telemetry  RENAL A:   No acute issue P:   Monitor BMET intermittently Monitor I/Os Correct electrolytes as indicated   GASTROINTESTINAL A:   Chronic PPI use P:   Cont PPI Cont diet as tolerated  HEMATOLOGIC A:   No acute issue P:  DVT px: SCDs Monitor CBC intermittently Transfuse per usual ICU guidelines  INFECTIOUS A:  Elevated WBC Doubt acute infectious process P:   Blood  8/17 >> NEG Urine 8/17 n>>NEG  Cefepime 8/17 >> 8/19 Vanc 8/17 >> 8/18  ENDOCRINE A:   Hyperglycemia, resolved P:   Monitor glu on chem panels Consider SSI if glu > 200  NEUROLOGIC A:   Hx of dementia and CVA Anxiety Adult FTT P:   Palliative care input appreciated Cont PRN morphine as ordered Low dose PRN alprazolam  TODAY'S SUMMARY:  Discussed with Dr Virgina Jock. He is out of town for [employer]. If pt not ready for discharge 8/24, he will take on his service  Merton Border, MD ; [facility] service Mobile [phone].  After 5:30 PM or weekends, call [phone]  12/30/2013, 1:17 PM

## 2013-12-30 NOTE — Clinical Documentation Improvement (Signed)
"  Diastolic congestion heart failure" in notes but no acuity, please document acuity if known.  Thank you  Possible Clinical Conditions?  Chronic Diastolic Congestive Heart Failure  Acute Diastolic Congestive Heart Failure  Acute on Chronic Diastolic Congestive Heart Failure  Other Condition  Cannot Clinically Determine  Diagnostics: 2 D- Echo Treatment: IV Lasix  Thank You, Ree Kida ,RN Clinical Documentation Specialist:  [facility] Information Management

## 2013-12-30 NOTE — Progress Notes (Signed)
Patient called me into the room stating that "I can't breath, can you help me". I repositioned her to sitting straight up and increased her O2 to 6L. I checked her pulse ox and it was 75%. I called respiratory and rapid response to assist as patient was having labored breathing and lips were turning blue. Respiratory arrived and placed patient on a NR mask. Rapid advised me to call the MD. Called Dr. Alva Garnet and he advised me to give her morphine. Gave 2 mg of morphine as ordered and ativan as ordered. O2 saturations are 95% on NR mask and HR that was as high as 149 is down to 105. Patient's BP is still elevated at 155/122. Patient states that she does not want to go back on a BIPAP machine and family is still undecided regarding comfort care for patient. Will continue to monitor closely. Glade Nurse, RN

## 2013-12-30 NOTE — Progress Notes (Signed)
Spoke with Dr. Deitra Mayo via phone, he has spoken with family members via telephone and has agreed with them to make pt comfort care. Instructed to give morphine and ativan as needed for comfort, change 100%nrb to 6Liters [place], titrate to comfort.  Monitor VS q day and may hold  po medications. Family agrees with treatment plan at this time.

## 2013-12-30 NOTE — Progress Notes (Signed)
OT Cancellation Note  Patient Details Name: NONNA RENNINGER MRN: [id number] DOB: Sep 18, 1933   Cancelled Treatment:    Reason Eval/Treat Not Completed: Other (comment) (Pt being transferred to floor. Noted discontinuation order. )Please reorder OT if needed. Thank you.  Reynolds, OTR/L  [phone] 12/30/2013 12/30/2013, 3:57 PM

## 2013-12-30 NOTE — Progress Notes (Signed)
Patient Melinda Mora      DOB: [birth year]      EKC:[id number]   Palliative Medicine Team at [facility] Progress Note    Subjective: Breathing feeling better. Transitioned to nasal cannula.  Appetite improving and ordering lunch this afternoon. Has not had BM since admission.  When she turns in bed, she is nto feeling SOB.  Has not got out of bed to chair yet.      Filed Vitals:   12/30/13 0756  BP:   Pulse:   Temp: 98.1 F (36.7 C)  Resp:    Physical exam: GEN: Alert, NAD HEENT: [place], sclera anicteric CV: RRR LUNGS: few scattered coarse sounds, symm expansion ABD: soft, NT, +BS   Assessment and plan:  80 yo female with end stage COPD, pAfib admitted with acute hypoxic/hypercarbic resp failure.   1. Code status- DNR  2. Goals of Care-  Was able to discuss more extensively today. She was able to discuss some of prior conversations about her lungs and even about dying.  She reflects on how difficult the thought is of not being here for her daughter or being able to see her grandchildren.  Has comfort in her spirituality and being able to see them again in future if she passes away.  Feeling much better today and hopeful to go back to nursing facility where she has also made some friends. With daughter at bedside, we discussed option of hospice care as a means to focus on quality of life and not having to come back to hospital.  Utilizing hospice facility if symptoms can not be controlled at facility. We also discussed that this may not be option 2/2 insurance issues with nursing home coverage and hospice care.  Could engage palliative care services to identify bridge to hospice in this scenario and still focus on QOL and comfort, not having to return to hospital. Ultimately she needs to think about this further.  Daughter expressed concerns that she has not had much control or quality to her life recently and wants to do what she can to make her time remaining as good as possible.  I  suspect that when she is ready for discharge, best bet will be to engage outpatient palliative care services.  I am available over weekend if urgent needs arise, but otherwise will plan on FU Monday.    3.  Constipation- No BM since admit. Will start doc/senna  4. Dyspnea- improving. Has PRN morphine but not needing.   Time In: 1100 Time Out: 1130 Total Time: 30 minutes  Doran Clay D.O. Palliative Medicine Team at [facility]  Pager: [phone] Team Phone: [phone]

## 2013-12-31 DIAGNOSIS — Z66 Do not resuscitate: Secondary | ICD-10-CM

## 2013-12-31 MED ORDER — MORPHINE SULFATE 2 MG/ML IJ SOLN
1.0000 mg | INTRAMUSCULAR | Status: DC | PRN
  Administered 2013-12-31: 1 mg via INTRAVENOUS
  Filled 2013-12-31: qty 1

## 2013-12-31 MED ORDER — DEXTROSE 5 % IV SOLN
0.5000 mg/h | INTRAVENOUS | Status: DC
  Administered 2013-12-31: 0.5 mg/h via INTRAVENOUS
  Filled 2013-12-31: qty 10

## 2013-12-31 NOTE — Progress Notes (Signed)
PULMONARY / CRITICAL CARE MEDICINE   Name: Melinda Mora MRN: [id number] DOB: 1933/11/20    ADMISSION DATE:  12/26/2013   REFERRING MD :  EDP  CHIEF COMPLAINT:  SOB  INITIAL PRESENTATION: Hypotensive, hypoxic, hypercarbic. Edema pattern on CXR. NPPV initiated in ED    SIGNIFICANT EVENTS/STUDIES: 8/18 Rough day with severe resp distress off BiPAP.  8/18 Echocardiogram: LVEF 55-60%. Evidence of diastolic dysfunction - not graded 8/18 Palliative Care consultation obtained for goals of care 8/19 Paroxysmal AFRVR. Diltiazem gtt ordered 8/19 AFRVR <> sinus with PACs. Rate poorly controlled when in AF. Amiodarone initiated. PRN morphine ordered. Further diuresis with Lasix ordered 8/20 in NSR on amio drip. Transitioned to PO 8/21 Much improved. Transferred to telemetry  SUBJECTIVE:  No new complaints. NAD. Has needed morphine for dyspnea. Comfort care. Family in room denies additional needs.  VITAL SIGNS: Temp:  [97.6 F (36.4 C)-98.7 F (37.1 C)] 97.6 F (36.4 C) (08/22 0645) Pulse Rate:  [71-130] 71 (08/22 0645) Resp:  [18-24] 18 (08/22 0645) BP: (106-163)/(55-90) 121/72 mmHg (08/22 0645) SpO2:  [75 %-96 %] 96 % (08/22 0645) HEMODYNAMICS:   VENTILATOR SETTINGS:   INTAKE / OUTPUT: No intake or output data in the 24 hours ending 12/31/13 0945  PHYSICAL EXAMINATION: General:  NAD on nasal cannula Neuro: no focal deficits, more interactive. Passive supine HEENT:  No JVD/LAN Cardiovascular:  HSD RRR Lungs: very distant faint bibasilar crackles, no wheezes Abdomen: Obese + bs Ext: no edema, cool  LABS: No new labs  CXR: NNF  ASSESSMENT / PLAN:  PULMONARY  A: Acute hypercarbic/hypoxic resp failure, resolved Chronic resp failure due to COPD P:   Cont supp O2 Cont PRN morphine ordered Change nebs to inhalers  CARDIOVASCULAR  A:  Hypotension, resolved Hx of MI Chronic diastolic CHF with elevated BNP Paroxysmal AFRVR > NSR P:  Holding losartan and  HCTZ Resume metoprolol 8/21 Cont PO amio load Transfered to telemetry  RENAL A:   No acute issue P:   Monitor BMET intermittently Monitor I/Os Correct electrolytes as indicated   GASTROINTESTINAL A:   Chronic PPI use P:   Cont PPI Cont diet as tolerated  HEMATOLOGIC A:   No acute issue P:  DVT px: SCDs Monitor CBC intermittently Transfuse per usual ICU guidelines  INFECTIOUS A:  Elevated WBC Doubt acute infectious process P:   Blood  8/17 >> NEG Urine 8/17 n>>NEG  Cefepime 8/17 >> 8/19 Vanc 8/17 >> 8/18  ENDOCRINE A:   Hyperglycemia, resolved P:   Monitor glu on chem panels Consider SSI if glu > 200  NEUROLOGIC A:   Hx of dementia and CVA Anxiety Adult FTT P:   Palliative care input appreciated- exploring possibility of [place] [facility] Cont PRN morphine as ordered Low dose PRN alprazolam  TODAY'S SUMMARY:  Discussed with Dr Virgina Jock. He is out of town for [employer]. If pt not ready for discharge 8/24, he will take on his service. Prompted for DVT prophy. Saw mention of SCDs, but not ordered. Have ordered for now despite comfort care status  CD Annamaria Boots, MD ; [facility] service Mobile [phone]. p [phone] After 5:30 PM or weekends, call [phone]  12/31/2013, 9:45 AM

## 2013-12-31 NOTE — Progress Notes (Signed)
Pt sleeping, arouses to verbal stimuli, answer appropriately, continues on 6 liters [place], NSR on telemetry with 6 beat run VT. Pt has been medicated with prn as needed and family request for labored respirations.  Pt is comfort care and DNR. Family members remain at bedside.

## 2013-12-31 NOTE — ED Provider Notes (Signed)
CSN: [id number]     Arrival date & time 12/26/13  6256 History   First MD Initiated Contact with Patient 12/26/13 [phone]     Chief Complaint  Patient presents with  . Respiratory Distress     (Consider location/radiation/quality/duration/timing/severity/associated sxs/prior Treatment) HPI Comments: LEVEL 5 CAVEAT FOR SEVERE RESPIRATORY DISTRESS  PT with hx of advanced COPD on home O2 continuously, CAD, no known CHF,  HTN, HL comes in with cc of DIB. PT is DNR/DNI. Reportedly, pt got acutely dyspneic about an hour ago at her nursing home. PT had hypoxia, and so nebs started and EMS was called, who were unable to get O2 sats above 80s despite tx and CPAP. PT arrives to the ER in resp distress, slight diaphoresis. She denies chest pain, cough. No hx of PE.  The history is provided by medical records and the EMS personnel. The history is limited by the condition of the patient.    Past Medical History  Diagnosis Date  . Hyperlipidemia   . Hypertension   . Depression   . Osteoarthritis   . Allergic rhinitis   . Raynaud disease   . Adenomatous polyp 03/2004  . Diverticulosis   . Syncope Jan 2013    NSVT noted on event monitor  . COPD (chronic obstructive pulmonary disease) with emphysema   . Pulmonary nodule, right x2  middle and lower lobes--  stable per follow-ip ct  jan 2012  . S/P primary angioplasty with coronary stent   . History of acute inferior wall myocardial infarction SEPT 2008      S/P STENTING PROXIMAL TO MID RCA  . Endometrial hyperplasia   . History of breast cancer 2011--  ONCOLOGIST- DR Mariea Clonts    S/P LEFT PARTIAL MASTECTOMY AND RADIATION---  NO RECURRENCE  . Dyspnea   . Anxiety   . CAD (coronary artery disease) CARDIOLOGIST-- DR Cristopher Peru--- LAST VISIT 08-16-2011  NOTE IN EPIC    Remote inferior MI in 2008. Extensive stenting of the RCA, s/p repeat stenting of the RCA in Dec 2010 due to restenosis  . Dry eyes, bilateral   . Heart attack   . Carotid  stenosis, asymptomatic BILATERAL ICA --  0-39%  PER  DUPLEX 05-22-2011  . Clotting disorder   . Closed dislocation of right ankle, with complex fracture 03/16/2012  . Cancer     breast (lumpectomy on left breast 2 years ago)   Past Surgical History  Procedure Laterality Date  . Tympanostomy tube placement    . Colonoscopy  JULY 2013  . US echocardiography  05-22-2011    LVSF NORMAL/ EF 60-65%  . Left partial mastectomy w/ bx axillary lymph node   09-04-2009    INVASIVE MAMMARY CARCINOMA/  STAGE T1b NO  . Coronary angioplasty with stent placement  04-19-2009  DR PETER Martinique    SINGLE VESSEL OBSTRUCTIVE DISEASE/  STENTING OF IN-STENT RESTENOSIS MID RCA/ NORMAL LVF  . Coronary angioplasty with stent placement  SEPT 2008--  [place]    STENTING PROXIMAL TO MID RCA  . Cardiovascular stress test  05-28-2011    LOW RISK STUDY  . Bilateral breast lumpectomy  BEFORE 2008  . Dilation and curettage of uterus  10/10/11  . Cataract extraction w/ intraocular lens  implant, bilateral    . Intrauterine device insertion    . Orif ankle fracture  03/16/2012    RIGHT ANKLE  . Orif ankle fracture  03/16/2012    Procedure: OPEN REDUCTION INTERNAL FIXATION (ORIF)  ANKLE FRACTURE;  Surgeon: Johnny Bridge, MD;  Location: [place];  Service: Orthopedics;  Laterality: Right;   Family History  Problem Relation Age of Onset  . Coronary artery disease Father     & Mother  . Heart attack Father   . Gout Father   . Osteoarthritis Mother   . Heart disease Mother     congestive heart failure  . Hypertension Sister   . Heart disease Sister   . Cancer Sister     breast  . Breast cancer      Aunts  . Uterine cancer Sister   . Colon cancer Neg Hx    History  Substance Use Topics  . Smoking status: Former Smoker -- 2.00 packs/day for 50 years    Types: Cigarettes    Quit date: 01/11/2007  . Smokeless tobacco: Never Used  . Alcohol Use: 3.6 oz/week    6 Glasses of wine per week      Comment: OCCASSIONAL   OB History   Grav Para Term Preterm Abortions TAB SAB Ect Mult Living   1 1 1       1      Review of Systems  Unable to perform ROS: Severe respiratory distress      Allergies  Codeine; Penicillins; and Shellfish allergy  Home Medications   Prior to Admission medications   Medication Sig Start Date End Date Taking? Authorizing Provider  ALPRAZolam (XANAX) 0.25 MG tablet Take 0.25 mg by mouth 2 (two) times daily.   Yes Historical Provider, MD  Amino Acids-Protein Hydrolys (FEEDING SUPPLEMENT, PRO-STAT SUGAR FREE 64,) LIQD Take 30 mLs by mouth daily.   Yes Historical Provider, MD  aspirin EC 81 MG tablet Take 81 mg by mouth daily.   Yes Historical Provider, MD  atorvastatin (LIPITOR) 40 MG tablet Take 1 tablet (40 mg total) by mouth daily. 06/11/11  Yes Peter M Martinique, MD  budesonide-formoterol [facility]) 160-4.5 MCG/ACT inhaler Inhale 2 puffs into the lungs 2 (two) times daily.   Yes Historical Provider, MD  Calcium Carbonate (OS-CAL PO) Take 500 mg by mouth daily.   Yes Historical Provider, MD  cilostazol (PLETAL) 50 MG tablet Take 50 mg by mouth 2 (two) times daily.    Yes Historical Provider, MD  clopidogrel (PLAVIX) 75 MG tablet Take 75 mg by mouth daily with breakfast.   Yes Historical Provider, MD  donepezil (ARICEPT) 5 MG tablet Take 5 mg by mouth at bedtime.   Yes Historical Provider, MD  escitalopram (LEXAPRO) 20 MG tablet Take 20 mg by mouth daily.   Yes Historical Provider, MD  hydrochlorothiazide (HYDRODIURIL) 25 MG tablet Take 6.25 mg by mouth daily.   Yes Historical Provider, MD  HYDROcodone-acetaminophen (NORCO) 10-325 MG per tablet Take 1-2 tablets by mouth every 6 (six) hours as needed for severe pain.   Yes Historical Provider, MD  ipratropium-albuterol (DUONEB) 0.5-2.5 (3) MG/3ML SOLN Take 3 mLs by nebulization 3 (three) times daily until doing well then convert to TID prn 10/10/13  Yes Precious Reel, MD  losartan (COZAAR) 50 MG tablet Take 50 mg  by mouth 2 (two) times daily.   Yes Historical Provider, MD  metoprolol succinate (TOPROL-XL) 50 MG 24 hr tablet Take 50 mg by mouth daily. Take with or immediately following a meal.   Yes Historical Provider, MD  Omega-3 Fatty Acids (FISH OIL) 1200 MG CAPS Take 1 capsule by mouth daily.   Yes Historical Provider, MD  omeprazole (PRILOSEC) 40 MG capsule Take  40 mg by mouth daily.   Yes Historical Provider, MD  saccharomyces boulardii (FLORASTOR) 250 MG capsule Take 1 capsule (250 mg total) by mouth 2 (two) times daily. 10/10/13  Yes Precious Reel, MD  tamsulosin (FLOMAX) 0.4 MG CAPS capsule Take 0.4 mg by mouth daily.   Yes Historical Provider, MD  tiotropium (SPIRIVA) 18 MCG inhalation capsule Place 18 mcg into inhaler and inhale every morning.    Yes Historical Provider, MD  acetaminophen (TYLENOL) 500 MG tablet Take 1,000 mg by mouth 2 (two) times daily as needed for mild pain or fever.    Historical Provider, MD  albuterol (VENTOLIN HFA) 108 (90 BASE) MCG/ACT inhaler Inhale 2 puffs into the lungs every 6 (six) hours as needed for shortness of breath.     Historical Provider, MD  docusate sodium (COLACE) 100 MG capsule Take 100 mg by mouth 2 (two) times daily as needed for mild constipation.    Historical Provider, MD  guaiFENesin (MUCINEX) 600 MG 12 hr tablet Take 1 tablet (600 mg total) by mouth 2 (two) times daily as needed for cough. 10/10/13   Precious Reel, MD  nitroGLYCERIN (NITROSTAT) 0.4 MG SL tablet Place 0.4 mg under the tongue every 5 (five) minutes as needed for chest pain.     Historical Provider, MD  polyethylene glycol (MIRALAX / GLYCOLAX) packet Take 17 g by mouth daily as needed for mild constipation.    Historical Provider, MD  promethazine (PHENERGAN) 25 MG tablet Take 25 mg by mouth 2 (two) times daily as needed for nausea or vomiting.    Historical Provider, MD  senna (SENOKOT) 8.6 MG TABS tablet Take 1 tablet by mouth daily as needed for mild constipation.    Historical Provider, MD    BP 121/72  Pulse 71  Temp(Src) 97.6 F (36.4 C) (Oral)  Resp 18  Ht 5' (1.524 m)  Wt 177 lb 11.2 oz (80.604 kg)  BMI 34.70 kg/m2  SpO2 96% Physical Exam  Nursing note and vitals reviewed. Constitutional: She appears well-nourished.  HENT:  Head: Atraumatic.  Eyes: Conjunctivae are normal.  Neck: Neck supple.  Cardiovascular: Exam reveals no gallop and no friction rub.   tachycardic  Pulmonary/Chest: She is in respiratory distress. She has wheezes. She has rales.  Abdominal: Soft.  Neurological: She is alert.  Skin: Skin is warm.    ED Course  Procedures (including critical care time) Labs Review Labs Reviewed  CBC WITH DIFFERENTIAL - Abnormal; Notable for the following:    WBC 17.6 (*)    RDW 16.9 (*)    Neutrophils Relative % 83 (*)    Neutro Abs 14.6 (*)    Lymphocytes Relative 11 (*)    All other components within normal limits  COMPREHENSIVE METABOLIC PANEL - Abnormal; Notable for the following:    Glucose, Bld 164 (*)    Albumin 2.9 (*)    Alkaline Phosphatase 126 (*)    GFR calc non Af Amer 59 (*)    GFR calc Af Amer 69 (*)    All other components within normal limits  PRO B NATRIURETIC PEPTIDE - Abnormal; Notable for the following:    Pro B Natriuretic peptide (BNP) 1952.0 (*)    All other components within normal limits  LACTIC ACID, PLASMA - Abnormal; Notable for the following:    Lactic Acid, Venous 2.3 (*)    All other components within normal limits  CBC - Abnormal; Notable for the following:    RBC 3.84 (*)  Hemoglobin 10.6 (*)    HCT 35.0 (*)    RDW 17.0 (*)    All other components within normal limits  BASIC METABOLIC PANEL - Abnormal; Notable for the following:    Glucose, Bld 115 (*)    GFR calc non Af Amer 81 (*)    All other components within normal limits  PRO B NATRIURETIC PEPTIDE - Abnormal; Notable for the following:    Pro B Natriuretic peptide (BNP) 3762.0 (*)    All other components within normal limits  GLUCOSE, CAPILLARY -  Abnormal; Notable for the following:    Glucose-Capillary 122 (*)    All other components within normal limits  GLUCOSE, CAPILLARY - Abnormal; Notable for the following:    Glucose-Capillary 121 (*)    All other components within normal limits  GLUCOSE, CAPILLARY - Abnormal; Notable for the following:    Glucose-Capillary 104 (*)    All other components within normal limits  GLUCOSE, CAPILLARY - Abnormal; Notable for the following:    Glucose-Capillary 107 (*)    All other components within normal limits  GLUCOSE, CAPILLARY - Abnormal; Notable for the following:    Glucose-Capillary 100 (*)    All other components within normal limits  BASIC METABOLIC PANEL - Abnormal; Notable for the following:    GFR calc non Af Amer 54 (*)    GFR calc Af Amer 63 (*)    All other components within normal limits  PRO B NATRIURETIC PEPTIDE - Abnormal; Notable for the following:    Pro B Natriuretic peptide (BNP) 6008.0 (*)    All other components within normal limits  BASIC METABOLIC PANEL - Abnormal; Notable for the following:    Potassium 3.6 (*)    Glucose, Bld 111 (*)    GFR calc non Af Amer 79 (*)    All other components within normal limits  BASIC METABOLIC PANEL - Abnormal; Notable for the following:    Glucose, Bld 116 (*)    GFR calc non Af Amer 61 (*)    GFR calc Af Amer 71 (*)    All other components within normal limits  I-STAT ARTERIAL BLOOD GAS, ED - Abnormal; Notable for the following:    pH, Arterial 7.302 (*)    pCO2 arterial 52.9 (*)    Bicarbonate 26.1 (*)    All other components within normal limits  CBG MONITORING, ED - Abnormal; Notable for the following:    Glucose-Capillary 162 (*)    All other components within normal limits  CULTURE, BLOOD (ROUTINE X 2)  CULTURE, BLOOD (ROUTINE X 2)  URINE CULTURE  MRSA PCR SCREENING  MAGNESIUM  PHOSPHORUS  PROCALCITONIN  CORTISOL  PROTIME-INR  APTT  STREP PNEUMONIAE URINARY ANTIGEN  LEGIONELLA ANTIGEN, URINE  TROPONIN I   PHOSPHORUS  MAGNESIUM  PROCALCITONIN  I-STAT TROPOININ, ED  I-STAT CG4 LACTIC ACID, ED    Imaging Review No results found.   EKG Interpretation   Date/Time:  Monday December 26 2013 06:34:19 EDT Ventricular Rate:  113 PR Interval:  168 QRS Duration: 77 QT Interval:  362 QTC Calculation: 496 R Axis:   46 Text Interpretation:  Sinus tachycardia Multiple premature complexes, vent   Borderline prolonged QT interval Baseline wander in lead(s) III V1 V4 ED  PHYSICIAN INTERPRETATION AVAILABLE IN [facility] Confirmed by TEST,  Record ([id number]) on 12/28/2013 7:20:35 AM      MDM   Final diagnoses:  Acute respiratory failure with hypoxia  Acute pulmonary edema  COPD exacerbation  CRITICAL CARE Performed by: Varney Biles   Total critical care time: 50 minutes  Critical care time was exclusive of separately billable procedures and treating other patients.  Critical care was necessary to treat or prevent imminent or life-threatening deterioration.  Critical care was time spent personally by me on the following activities: development of treatment plan with patient and/or surrogate as well as nursing, discussions with consultants, evaluation of patient's response to treatment, examination of patient, obtaining history from patient or surrogate, ordering and performing treatments and interventions, ordering and review of laboratory studies, ordering and review of radiographic studies, pulse oximetry and re-evaluation of patient's condition.   PT comes in respiratory failure hypoxia She is noted to have wheezing, diffuse with some basilar rales and CXR showing pulm edema. Lactate is < 3, mild leukocytosis, and hx of advanced COPD. Possible PNA on CXR along with the pulm edema.  Thin sx are predominantly from CHF/Pulm edema, but given no hx of CHF, ALI from PNA also possible, with of course COPD contributing to her status. DNR/DNI confirmed with daughter.  Broad spectrum  AB given, BIPAP and breathing tx continued with COPD tx provided. CCM to admit.  Varney Biles, MD 12/31/13 [phone]

## 2013-12-31 NOTE — Progress Notes (Signed)
Patient OI:BBCWU B [name]      DOB: 12-10-1933      GQB:[id number]   Palliative Medicine Team at [facility] Progress Note    Subjective: Called last night as Melinda Mora went into respiratory distress.  Reportedly breathing around 30x/min and very labored. Started on IV morphine PRN yesterday evening.  Breathing much more controlled this morning but still very labored. Melinda Mora is more confused today but able to converse some and pleasant.  She denies any pain or dyspnea though breathing clearly still labored. Family reports that few hours after morphine injection, her breathing becomes much more labored.     Filed Vitals:   12/31/13 0645  BP: 121/72  Pulse: 71  Temp: 97.6 F (36.4 C)  Resp: 18   Physical exam: GEN: alert, labored breathing HEENT: [place], sclera anicteric CV: RRR LUNGS: scattered coarse sounds with decreased air movement. Using accessory muscles of breathing.  ABD: soft, ND, NT EXT: no cyanosis   Assessment and plan: 80 yo female with end stage COPD, pAfib admitted with acute hypoxic/hypercarbic resp failure. Respiratory distress overnight and goals switched to comfort.   1. Code status- DNR   2. Goals of Care- Switched to comfort last night with worsening respiratory failure in setting of end stage COPD.  She refused to go back on BiPAP, tachypnic, labored breathing. Her breathing is more controlled with morphine but still using accessory muscles. Family wishes to continue comfort care. Will simplify med list.  Talked about transfer to residential hospice and they would be open to this as long as they could stay locally.  I think she would be stable to transfer, but if it takes time to find bed it is possible she could become too unstable to make safe transfer.  I will place social work consult for residential hospice referral.  She has OOH DNR in chart.   3. Dyspnea-breathing more controlled this morning but still having some accessory muscle use.  Used 12mg  IV morphine  since yesterday evening. Family reports that few hours after morphine given, her breathing starts to become much more labored again.  Will go ahead and start a low dose morphine infusion at 0.5mg /hr.  Bolus dosing to continue PRN.     Time In: 0810 Time Out: 0835 Total Time:25 minutes

## 2014-01-01 DIAGNOSIS — F411 Generalized anxiety disorder: Secondary | ICD-10-CM

## 2014-01-01 DIAGNOSIS — I2583 Coronary atherosclerosis due to lipid rich plaque: Secondary | ICD-10-CM

## 2014-01-01 DIAGNOSIS — I2510 Atherosclerotic heart disease of native coronary artery without angina pectoris: Secondary | ICD-10-CM

## 2014-01-01 DIAGNOSIS — J438 Other emphysema: Secondary | ICD-10-CM

## 2014-01-01 LAB — CULTURE, BLOOD (ROUTINE X 2)
CULTURE: NO GROWTH
CULTURE: NO GROWTH

## 2014-01-01 NOTE — Progress Notes (Signed)
Patient Melinda Mora      DOB: [birth year]      YFV:[id number]   Palliative Medicine Team at [facility] Progress Note    Subjective: More conversive today. Daughter at bedside. Breathing controlled and not having dyspnea sensation.   Denies pain. Eating some breakfast this morning. No N/V.       Filed Vitals:   01/01/14 0614  BP: 119/57  Pulse: 83  Temp: 98 F (36.7 C)  Resp: 20   Physical exam: GEN: Alert, NAD HEENT: , sclera anicteric, mmm CV: RRR LUNGS: scattered coarse sounds, decreased breath sounds bilaterally, accessory muscle use ABD: soft, NT, ND EXT: no c/c/e SKIN: warm/dry   Assessment and plan: 80 yo female with end stage COPD, pAfib admitted with acute hypoxic/hypercarbic resp failure. Respiratory distress overnight on 8/21 and goals switched to comfort.   1. Code status- DNR   2. Goals of Care- Switched to comfort 8/21 with worsening respiratory failure in setting of end stage COPD. She refused to go back on BiPAP, tachypnic, labored breathing. Morphine drip is controlling her breathing but still using accessory muscles and suspect she would still tire out in next days to weeks.  As she is more conversational and alert today, daughter and I gently discussed her situation and talked about dying with her COPD.  We discussed hospice home and comfort care and a little about what this would potenitally look like. She is in agreement with looking into residential hospice facility. Consult placed with SW. Suspect this may be difficult to arrange over weekend.   3. Dyspnea-rate controlled and feels comfortable. Still using accessory muscles of breathing. Would continue morphine infusion at 0.5mg /hr with PRN bolus for acute symptoms.    4. Anxiety- controlled. PRN benzo's  Time In: 812 Time Out: 8437 Total Time: 25 minutes  Doran Clay D.O. Palliative Medicine Team at [facility]  Pager: [phone] Team Phone: [phone]

## 2014-01-01 NOTE — Progress Notes (Signed)
Clinical Social Work Department BRIEF PSYCHOSOCIAL ASSESSMENT 01/01/2014  Patient:  Melinda Mora, Melinda Mora     Account Number:  [id number]     Admit date:  12/26/2013  Clinical Social Worker:  Rolinda Roan  Date/Time:  01/01/2014 05:19 PM  Referred by:  Physician  Date Referred:  12/31/2013 Referred for  Residential hospice placement   Other Referral:   Interview type:  Family Other interview type:    PSYCHOSOCIAL DATA Living Status:  ALONE Admitted from facility:   Level of care:   Primary support name:  Melinda Mora [phone] Primary support relationship to patient:  CHILD, ADULT Degree of support available:   Strong support at bedside.    CURRENT CONCERNS  Other Concerns:    SOCIAL WORK ASSESSMENT / PLAN Clinical Social Worker (CSW) met with patient and her daughter Melinda Mora at bedside. CSW introduced self and explained role of social work. Daughter reported that they are interested in residential hospice placement and prefer [facility]. CSW encouraged daughter to think about a second choice in case [facility] does not have a bed. [name] reported that [facility] is her second choice.   Assessment/plan status:  Psychosocial Support/Ongoing Assessment of Needs Other assessment/ plan:   Information/referral to community resources:   CSW made referral to [place] social worker and [place] liaison.    PATIENT'S/FAMILY'S RESPONSE TO PLAN OF CARE: Patient and daughter thanked CSW for visit and assisting with placement process.

## 2014-01-01 NOTE — Progress Notes (Signed)
PULMONARY / CRITICAL CARE MEDICINE   Name: Melinda Mora MRN: [id number] DOB: Nov 06, 1933    ADMISSION DATE:  12/26/2013   REFERRING MD :  EDP  CHIEF COMPLAINT:  SOB  INITIAL PRESENTATION: Hypotensive, hypoxic, hypercarbic. Edema pattern on CXR. NPPV initiated in ED    SIGNIFICANT EVENTS/STUDIES: 8/18 Rough day with severe resp distress off BiPAP.  8/18 Echocardiogram: LVEF 55-60%. Evidence of diastolic dysfunction - not graded 8/18 Palliative Care consultation obtained for goals of care 8/19 Paroxysmal AFRVR. Diltiazem gtt ordered 8/19 AFRVR <> sinus with PACs. Rate poorly controlled when in AF. Amiodarone initiated. PRN morphine ordered. Further diuresis with Lasix ordered 8/20 in NSR on amio drip. Transitioned to PO 8/21 Much improved. Transferred to telemetry  SUBJECTIVE:  No new complaints. NAD now and feels "some better". Has needed morphine for dyspnea. Comfort care. We discussed her conversation with palliative care.  VITAL SIGNS: Temp:  [97.3 F (36.3 C)-98 F (36.7 C)] 98 F (36.7 C) (08/23 0614) Pulse Rate:  [68-83] 83 (08/23 0614) Resp:  [17-24] 20 (08/23 0614) BP: (119-127)/(57-70) 119/57 mmHg (08/23 0614) SpO2:  [96 %-100 %] 96 % (08/23 0614) HEMODYNAMICS:   VENTILATOR SETTINGS:   INTAKE / OUTPUT:  Intake/Output Summary (Last 24 hours) at 01/01/14 0948 Last data filed at 12/31/13 1300  Gross per 24 hour  Intake    120 ml  Output      0 ml  Net    120 ml    PHYSICAL EXAMINATION: General:  NAD on nasal cannula. Seems brighter today Neuro: no focal deficits, more interactive. Passive supine, oriented HEENT:  No JVD/LAN Cardiovascular:  HSD RRR Lungs: very distant faint bibasilar crackles, no wheezes Abdomen: Obese + bs Ext: no edema, cool  LABS: No new labs  CXR: NNF  ASSESSMENT / PLAN:  PULMONARY  A: Acute hypercarbic/hypoxic resp failure, resolved Chronic resp failure due to COPD P:   Cont supp O2 Cont PRN morphine ordered Change  nebs to inhalers  CARDIOVASCULAR  A:  Hypotension, resolved Hx of MI Chronic diastolic CHF with elevated BNP Paroxysmal AFRVR > NSR P:  Holding losartan and HCTZ Resumed metoprolol 8/21 Cont PO amio load   RENAL A:   No acute issue P:   Monitor BMET intermittently Monitor I/Os Correct electrolytes as indicated   GASTROINTESTINAL A:   Chronic PPI use P:   Cont PPI Cont diet as tolerated  HEMATOLOGIC A:   No acute issue P:  DVT px: SCDs Monitor CBC intermittently   INFECTIOUS A:  Elevated WBC Doubt acute infectious process P:   Blood  8/17 >> NEG Urine 8/17 n>>NEG  Cefepime 8/17 >> 8/19 Vanc 8/17 >> 8/18  ENDOCRINE A:   Hyperglycemia, resolved P:   Monitor glu on chem panels Consider SSI if glu > 200  NEUROLOGIC A:   Hx of dementia and CVA Anxiety Adult FTT P:   Palliative care input appreciated- exploring possibility of [facility] Cont PRN morphine as ordered Low dose PRN alprazolam  TODAY'S SUMMARY:  Has discussed residential hospice with Palliative care. Anticipate Dr Virgina Jock taking on his service Monday - will need to be contacted again to confirm  CD Annamaria Boots, MD ; [facility] [phone]. p [phone] After 5:30 PM or weekends, call [phone]  01/01/2014, 9:48 AM

## 2014-01-02 MED ORDER — POLYETHYLENE GLYCOL 3350 17 G PO PACK
17.0000 g | PACK | Freq: Every day | ORAL | Status: DC
  Administered 2014-01-02: 17 g via ORAL
  Filled 2014-01-02 (×2): qty 1

## 2014-01-02 NOTE — Clinical Social Work Note (Signed)
Clinical Social Worker continuing to follow patient and family for support and discharge planning needs.  CSW spoke with patient at bedside who states that she is understanding of residential hospice placement, however her daughter makes all primary decisions on her behalf.  CSW spoke with patient daughter over the phone who states that patient was a resident at [facility] prior to admission.  Patient daughter informed that there is no bed availability at [facility].  Patient daughter states that she was under the impression that patient would remain hospitalized until a bed became available.  CSW politely explained the process and need for alternative placement.  Patient daughter does not agree for patient to be evaluated for [facility] at this time and does not feel that patient is medically stable to return to [facility] with Hospice following.    Patient daughter requested confirmation from patient PCP (Dr. Virgina Jock) or Palliative medicine that patient is medically appropriate to return to [facility] with hospice following while she awaits bed availability at [facility].  CSW left a message with Dr. Keane Police RN to communicate with patient daughter regarding patient disposition and readiness for discharge.  CSW remains available for support and to facilitate patient discharge needs once medically ready.  Barbette Or, [place]

## 2014-01-02 NOTE — Progress Notes (Addendum)
Patient Melinda Mora      DOB: November 14, 1933      [id number]   Palliative Medicine Team at [facility] Progress Note    Subjective: Patient seen and examined. Resting comfortably without distress. Noted events over the weekend. Patient's daughter not at bedside at this time.Patient required the initiation of Morphine drip over the weekend which is low dose but necessary.  Noted disposition question from SW. If Hospice feels they can manage the patient with morphine drip at [facility] then this should be offered and explained to the patient and family.  Patient may do ok with scheduled Roxanol orally, the dilemma is that she gets into trouble quickly when she gets into distress which may be related to her underlying cardiac status.   Filed Vitals:   01/02/14 1114  BP: 114/62  Pulse: 84  Temp: 97.8 F (36.6 C)  Resp:    Physical exam:  General: no acute distress at this time. No use of accessory muscles.  Patient slightly confused but pleasant Chest decreased but clear CVS: irreg , S1, s2 Abd obese , not tender Ext: warm, trace not pitting edema Neuro: pleasantly confused.    Assessment and plan: 79 yr old white female with advanced COPD compounded by heart failure and afib with RVR.  Friday evening patient developed acute distress requiring initiation of morphine drip.  Patient at this time comfortable but status changes quickly.  1.  DNR  2.  COPD with exacerbation : holding her own on Morphine 0.5 mg/hr able to be interactive at that rate. No boluses yesterday or today noted. Patient has opted to not use bipap again for rescue. Continue aggressive pulmonary toilet.  3.  Plan to transition if bed available, to hospice facility setting.  4.  Afib with RVR: continue amiodarone and metoprolol  5.  Obstipation: no bowel movement since the 17th. Senna added will add miralax.  6.  Failure to thrive; patient eating only 5-10 % of meals   Total Time:  1200-1220  Melinda Kinslow L.  Lovena Le, MD MBA The Palliative Medicine Team at [facility] Phone: [phone] Pager: [phone] ( Use team phone after hours)   3.

## 2014-01-02 NOTE — Consult Note (Signed)
[facility] Liaison: Received message referral from CSW at 5:16 pm Sunday, 01/01/14. Unfortunately no [facility] availability today 01/02/14. Will update CSW if availability changes. Thank you. Erling Conte, [place]

## 2014-01-02 NOTE — Progress Notes (Signed)
Subjective: ESCOPD now Hospice Care/Comfort Measures. SOB and wheezing this am. Looking for fan. Mentally better than Wednesday. She says she is comfortable and not anxious despite appearing that way  Objective: Vital signs in last 24 hours: Temp:  [97.8 F (36.6 C)-98.3 F (36.8 C)] 97.8 F (36.6 C) (08/24 0504) Pulse Rate:  [74-83] 79 (08/24 0504) Resp:  [18-20] 20 (08/24 0504) BP: (118-132)/(56-66) 130/66 mmHg (08/24 0504) SpO2:  [99 %-100 %] 100 % (08/24 0504) Weight change:  Last BM Date:  (pt can not recall)  CBG (last 3)  No results found for this basename: GLUCAP,  in the last 72 hours  Intake/Output from previous day:  Intake/Output Summary (Last 24 hours) at 01/02/14 0656 Last data filed at 01/01/14 1433  Gross per 24 hour  Intake    170 ml  Output      0 ml  Net    170 ml   08/23 0701 - 08/24 0700 In: 170 [P.O.:170] Out: -    Physical Exam  General appearance: Tachypnic and accessory muscles to breathe Throat: oropharynx moist without erythema Resp: Tachypnic and accessory muscles to breathe Cardio: Irreg, m GI: Distended Extremities: no clubbing, cyanosis or edema   Lab Results: No results found for this basename: NA, K, CL, CO2, GLUCOSE, BUN, CREATININE, CALCIUM, MG, PHOS,  in the last 72 hours  No results found for this basename: AST, ALT, ALKPHOS, BILITOT, PROT, ALBUMIN,  in the last 72 hours  No results found for this basename: WBC, NEUTROABS, HGB, HCT, MCV, PLT,  in the last 72 hours  Lab Results  Component Value Date   INR 1.11 12/26/2013    No results found for this basename: CKTOTAL, CKMB, CKMBINDEX, TROPONINI,  in the last 72 hours  No results found for this basename: TSH, T4TOTAL, FREET3, T3FREE, THYROIDAB,  in the last 72 hours  No results found for this basename: VITAMINB12, FOLATE, FERRITIN, TIBC, IRON, RETICCTPCT,  in the last 72 hours  Micro Results: Recent Results (from the past 240 hour(s))  CULTURE, BLOOD (ROUTINE X 2)      Status: None   Collection Time    12/26/13  9:30 AM      Result Value Ref Range Status   Specimen Description BLOOD LEFT ANTECUBITAL   Final   Special Requests BOTTLES DRAWN AEROBIC AND ANAEROBIC 10CC   Final   Culture  Setup Time     Final   Value: 12/26/2013 15:07     Performed at [place]     Final   Value: NO GROWTH 5 DAYS     Performed at [facility]   Report Status 01/01/2014 FINAL   Final  CULTURE, BLOOD (ROUTINE X 2)     Status: None   Collection Time    12/26/13  9:45 AM      Result Value Ref Range Status   Specimen Description BLOOD LEFT ARM   Final   Special Requests BOTTLES DRAWN AEROBIC AND ANAEROBIC 10CC   Final   Culture  Setup Time     Final   Value: 12/26/2013 15:07     Performed at [place]     Final   Value: NO GROWTH 5 DAYS     Performed at [facility]   Report Status 01/01/2014 FINAL   Final  MRSA PCR SCREENING     Status: None   Collection Time    12/26/13  2:48 PM  Result Value Ref Range Status   MRSA by PCR NEGATIVE  NEGATIVE Final   Comment:            The GeneXpert MRSA Assay (FDA     approved for NASAL specimens     only), is one component of a     comprehensive MRSA colonization     surveillance program. It is not     intended to diagnose MRSA     infection nor to guide or     monitor treatment for     MRSA infections.  URINE CULTURE     Status: None   Collection Time    12/27/13  8:58 PM      Result Value Ref Range Status   Specimen Description URINE, CATHETERIZED   Final   Special Requests NONE   Final   Culture  Setup Time     Final   Value: 12/27/2013 21:45     Performed at [place]     Final   Value: NO GROWTH     Performed at [facility]   Culture     Final   Value: NO GROWTH     Performed at [facility]   Report Status 12/29/2013 FINAL   Final     Studies/Results: No results found.   Medications:  Scheduled: . amiodarone  400 mg Oral BID  . antiseptic oral rinse  7 mL Mouth Rinse q12n4p  . chlorhexidine  15 mL Mouth Rinse BID  . metoprolol succinate  50 mg Oral Daily  . pantoprazole  40 mg Oral Q1200  . potassium chloride  40 mEq Oral Once  . senna-docusate  1 tablet Oral QHS   Continuous: . sodium chloride Stopped (01/01/14 0740)  . morphine 0.5 mg/hr (12/31/13 3818)     Assessment/Plan: Principal Problem:   Acute respiratory failure with hypoxemia Active Problems:   BREAST CANCER   PULMONARY NODULE   COPD (chronic obstructive pulmonary disease) Gold B Oxygen dependent   CAD (coronary artery disease)   Respiratory failure   Hypotension   Failure to thrive (0-17)   DNR (do not resuscitate)   Diastolic CHF, acute on chronic   Atrial fibrillation with RVR   Pulmonary edema  ESCOPD - For [employer] [facility] if/when bed available.  DNR. Comfort measures. Afib - Amio/BB If pt to remain in hospital past today - Dr Alva Garnet to put pt on my service.    ID -  Anti-infectives   Start     Dose/Rate Route Frequency Ordered Stop   12/27/13 1000  ceFEPIme (MAXIPIME) 2 g in dextrose 5 % 50 mL IVPB  Status:  Discontinued     2 g 100 mL/hr over 30 Minutes Intravenous Every 24 hours 12/26/13 0936 12/28/13 1103   12/26/13 2300  vancomycin (VANCOCIN) IVPB 750 mg/150 ml premix  Status:  Discontinued     750 mg 150 mL/hr over 60 Minutes Intravenous Every 12 hours 12/26/13 0933 12/27/13 0944   12/26/13 1000  ceFEPIme (MAXIPIME) 2 g in dextrose 5 % 50 mL IVPB     2 g 100 mL/hr over 30 Minutes Intravenous  Once 12/26/13 0935 12/26/13 1103   12/26/13 0945  vancomycin (VANCOCIN) IVPB 1000 mg/200 mL premix     1,000 mg 200 mL/hr over 60 Minutes Intravenous  Once 12/26/13 0933 12/26/13 1233     LOS: 7 days   Darden Flemister M 01/02/2014, 6:56 AM

## 2014-01-02 NOTE — Clinical Social Work Note (Signed)
Clinical Social Worker continuing to follow patient and family for support and discharge planning needs.  CSW spoke with patient daughter over the phone this afternoon regarding disposition plans.  Patient daughter is now agreeable with [facility] coming to assess patient.  CSW to follow up with [facility] in the morning and facilitate patient discharge needs.  Barbette Or, [place]

## 2014-01-03 MED ORDER — MORPHINE SULFATE 2 MG/ML IJ SOLN: 0.5000 mg | Freq: Once | INTRAMUSCULAR | Status: DC

## 2014-01-03 MED ORDER — MORPHINE SULFATE 10 MG/ML IJ SOLN: 0.5000 mg/h | INTRAVENOUS | Status: AC

## 2014-01-03 MED ORDER — MORPHINE BOLUS VIA INFUSION
0.5000 mg | Freq: Once | INTRAVENOUS | Status: AC
  Administered 2014-01-03: 0.5 mg via INTRAVENOUS

## 2014-01-03 MED ORDER — ALPRAZOLAM 0.25 MG PO TABS: 0.2500 mg | ORAL_TABLET | Freq: Three times a day (TID) | ORAL | Status: AC | PRN

## 2014-01-03 MED ORDER — ALBUTEROL SULFATE (2.5 MG/3ML) 0.083% IN NEBU
2.5000 mg | INHALATION_SOLUTION | RESPIRATORY_TRACT | Status: AC | PRN
Start: 2014-01-03 — End: ?

## 2014-01-03 MED ORDER — SODIUM CHLORIDE 0.9 % IV SOLN: INTRAVENOUS | Status: AC

## 2014-01-03 MED ORDER — IPRATROPIUM-ALBUTEROL 0.5-2.5 (3) MG/3ML IN SOLN: RESPIRATORY_TRACT | Status: AC

## 2014-01-03 MED ORDER — AMIODARONE HCL 400 MG PO TABS: 200.0000 mg | ORAL_TABLET | Freq: Every day | ORAL | Status: AC

## 2014-01-03 MED ORDER — CHLORHEXIDINE GLUCONATE 0.12 % MT SOLN: 15.0000 mL | Freq: Two times a day (BID) | OROMUCOSAL | Status: AC

## 2014-01-03 MED ORDER — CETYLPYRIDINIUM CHLORIDE 0.05 % MT LIQD: 7.0000 mL | Freq: Two times a day (BID) | OROMUCOSAL | Status: AC

## 2014-01-03 MED ORDER — SENNOSIDES-DOCUSATE SODIUM 8.6-50 MG PO TABS: 1.0000 | ORAL_TABLET | Freq: Every day | ORAL | Status: AC

## 2014-01-03 MED ORDER — MORPHINE SULFATE 2 MG/ML IJ SOLN: 1.0000 mg | INTRAMUSCULAR | Status: AC | PRN

## 2014-01-03 MED ORDER — LORAZEPAM 2 MG/ML IJ SOLN: 0.5000 mg | INTRAMUSCULAR | Status: AC | PRN

## 2014-01-03 NOTE — Discharge Summary (Signed)
Physician Discharge Summary  DISCHARGE SUMMARY   Patient ID: Melinda Mora MR#: [id number] DOB/AGE: 01/19/1934 80 y.o.   Attending 55 M  Patient's DGU:YQIHK,VQQV M, MD  Consults:Treatment Team:  Palliative [facility]**  Admit date: 12/26/2013 Discharge date: 01/03/2014  Discharge Diagnoses:  Principal Problem:   Acute respiratory failure with hypoxemia Active Problems:   BREAST CANCER   PULMONARY NODULE   COPD (chronic obstructive pulmonary disease) Gold B Oxygen dependent   CAD (coronary artery disease)   Respiratory failure   Hypotension   Failure to thrive (0-17)   DNR (do not resuscitate)   Diastolic CHF, acute on chronic   Atrial fibrillation with RVR   Pulmonary edema   Patient Active Problem List   Diagnosis Date Noted  . Atrial fibrillation with RVR 12/28/2013  . Pulmonary edema 12/28/2013  . Diastolic CHF, acute on chronic 12/27/2013  . Respiratory failure 12/26/2013  . Hypotension 12/26/2013  . Failure to thrive (0-17) 12/26/2013  . DNR (do not resuscitate) 12/26/2013  . Acute respiratory failure with hypoxemia 12/26/2013  . CAP (community acquired pneumonia) 10/06/2013  . COPD exacerbation 10/06/2013  . Closed dislocation of right ankle, with complex fracture 03/16/2012  . Endometrial ca 10/28/2011  . Ventricular tachycardia 07/16/2011  . Syncope 05/22/2011  . COPD (chronic obstructive pulmonary disease) Gold B Oxygen dependent   . CAD (coronary artery disease)   . BREAST CANCER 11/16/2009  . HYPERLIPIDEMIA 11/16/2009  . HYPERTENSION 11/16/2009  . MI 11/16/2009  . ALLERGIC RHINITIS 11/16/2009  . EMPHYSEMA 11/16/2009  . PULMONARY NODULE 11/16/2009   Past Medical History  Diagnosis Date  . Hyperlipidemia   . Hypertension   . Depression   . Osteoarthritis   . Allergic rhinitis   . Raynaud disease   . Adenomatous polyp 03/2004  . Diverticulosis   . Syncope Jan 2013    NSVT noted on event monitor  . COPD (chronic  obstructive pulmonary disease) with emphysema   . Pulmonary nodule, right x2  middle and lower lobes--  stable per follow-ip ct  jan 2012  . S/P primary angioplasty with coronary stent   . History of acute inferior wall myocardial infarction SEPT 2008      S/P STENTING PROXIMAL TO MID RCA  . Endometrial hyperplasia   . History of breast cancer 2011--  ONCOLOGIST- DR Mariea Clonts    S/P LEFT PARTIAL MASTECTOMY AND RADIATION---  NO RECURRENCE  . Dyspnea   . Anxiety   . CAD (coronary artery disease) CARDIOLOGIST-- DR Cristopher Peru--- LAST VISIT 08-16-2011  NOTE IN EPIC    Remote inferior MI in 2008. Extensive stenting of the RCA, s/p repeat stenting of the RCA in Dec 2010 due to restenosis  . Dry eyes, bilateral   . Heart attack   . Carotid stenosis, asymptomatic BILATERAL ICA --  0-39%  PER  DUPLEX 05-22-2011  . Clotting disorder   . Closed dislocation of right ankle, with complex fracture 03/16/2012  . Cancer     breast (lumpectomy on left breast 2 years ago)    Discharged Condition: terminal; hospice.   Discharge Medications:   Medication List    STOP taking these medications       aspirin EC 81 MG tablet     atorvastatin 40 MG tablet  Commonly known as:  LIPITOR     cilostazol 50 MG tablet  Commonly known as:  PLETAL     clopidogrel 75 MG tablet  Commonly known as:  PLAVIX     docusate sodium  100 MG capsule  Commonly known as:  COLACE     donepezil 5 MG tablet  Commonly known as:  ARICEPT     feeding supplement (PRO-STAT SUGAR FREE 64) Liqd     Fish Oil 1200 MG Caps     hydrochlorothiazide 25 MG tablet  Commonly known as:  HYDRODIURIL     losartan 50 MG tablet  Commonly known as:  COZAAR     omeprazole 40 MG capsule  Commonly known as:  PRILOSEC     OS-CAL PO     saccharomyces boulardii 250 MG capsule  Commonly known as:  FLORASTOR     senna 8.6 MG Tabs tablet  Commonly known as:  SENOKOT     tamsulosin 0.4 MG Caps capsule  Commonly known as:  FLOMAX      VENTOLIN HFA 108 (90 BASE) MCG/ACT inhaler  Generic drug:  albuterol  Replaced by:  albuterol (2.5 MG/3ML) 0.083% nebulizer solution      TAKE these medications       acetaminophen 500 MG tablet  Commonly known as:  TYLENOL  Take 1,000 mg by mouth 2 (two) times daily as needed for mild pain or fever.     albuterol (2.5 MG/3ML) 0.083% nebulizer solution  Commonly known as:  PROVENTIL  Take 3 mLs (2.5 mg total) by nebulization every 3 (three) hours as needed for wheezing or shortness of breath.     ALPRAZolam 0.25 MG tablet  Commonly known as:  XANAX  Take 1 tablet (0.25 mg total) by mouth 3 (three) times daily as needed for anxiety.     amiodarone 400 MG tablet  Commonly known as:  PACERONE  Take 0.5 tablets (200 mg total) by mouth daily.     antiseptic oral rinse 0.05 % Liqd solution  Commonly known as:  CPC / CETYLPYRIDINIUM CHLORIDE 0.05%  7 mLs by Mouth Rinse route 2 times daily at 12 noon and 4 pm.     budesonide-formoterol 160-4.5 MCG/ACT inhaler  Commonly known as:  SYMBICORT  Inhale 2 puffs into the lungs 2 (two) times daily.     chlorhexidine 0.12 % solution  Commonly known as:  PERIDEX  15 mLs by Mouth Rinse route 2 (two) times daily.     escitalopram 20 MG tablet  Commonly known as:  LEXAPRO  Take 20 mg by mouth daily.     guaiFENesin 600 MG 12 hr tablet  Commonly known as:  MUCINEX  Take 1 tablet (600 mg total) by mouth 2 (two) times daily as needed for cough.     HYDROcodone-acetaminophen 10-325 MG per tablet  Commonly known as:  NORCO  Take 1-2 tablets by mouth every 6 (six) hours as needed for severe pain.     ipratropium-albuterol 0.5-2.5 (3) MG/3ML Soln  Commonly known as:  DUONEB  Take 3 mLs by nebulization 3 (three) times prn SOb/Wheezing     LORazepam 2 MG/ML injection  Commonly known as:  ATIVAN  Inject 0.25 mLs (0.5 mg total) into the vein every 4 (four) hours as needed for anxiety (dyspnea).     metoprolol succinate 50 MG 24 hr tablet   Commonly known as:  TOPROL-XL  Take 50 mg by mouth daily. Take with or immediately following a meal.     morphine 100 mg in dextrose 5 % 100 mL  Inject 0.5 mg/hr into the vein continuous.     morphine 2 MG/ML injection  Inject 0.5-1 mLs (1-2 mg total) into the vein every hour as  needed (or dyspnea).     nitroGLYCERIN 0.4 MG SL tablet  Commonly known as:  NITROSTAT  Place 0.4 mg under the tongue every 5 (five) minutes as needed for chest pain.     polyethylene glycol packet  Commonly known as:  MIRALAX / GLYCOLAX  Take 17 g by mouth daily as needed for mild constipation.     promethazine 25 MG tablet  Commonly known as:  PHENERGAN  Take 25 mg by mouth 2 (two) times daily as needed for nausea or vomiting.     senna-docusate 8.6-50 MG per tablet  Commonly known as:  Senokot-S  Take 1 tablet by mouth at bedtime.     sodium chloride 0.9 % infusion  10 cc/Hr  Inject into the vein continuous.     tiotropium 18 MCG inhalation capsule  Commonly known as:  SPIRIVA  Place 18 mcg into inhaler and inhale every morning.        Hospital Procedures: Dg Chest Port 1 View  12/29/2013   CLINICAL DATA:  Respiratory failure.  EXAM: PORTABLE CHEST - 1 VIEW  COMPARISON:  12/28/2013  FINDINGS: Lungs are adequately inflated with continued indistinctness of the perihilar regions and infrahilar regions left worse than right with slight interval improvement suggesting improving mild interstitial edema/ vascular congestion. Cannot exclude a small amount of left pleural fluid. There is mild stable cardiomegaly. There is moderate calcified plaque involving the thoracic and abdominal aorta. Remainder the exam is unchanged.  IMPRESSION: Findings likely representing mild interstitial edema/vascular congestion with slight interval improvement. Possible small amount of left pleural fluid.  Mild stable cardiomegaly.   Electronically Signed   By: Marin Olp M.D.   On: 12/29/2013 08:06   Dg Chest Port 1  View  12/28/2013   CLINICAL DATA:  Respiratory failure.  EXAM: PORTABLE CHEST - 1 VIEW  COMPARISON:  December 27, 2013.  FINDINGS: Stable cardiomediastinal silhouette. Central pulmonary vascular congestion and bilateral pulmonary edema is again noted and unchanged compared to prior exam. No pneumothorax is noted. Minimal left pleural effusion is noted. Bony thorax appears intact.  IMPRESSION: Stable central pulmonary vascular congestion and bilateral pulmonary edema.   Electronically Signed   By: Sabino Dick M.D.   On: 12/28/2013 07:51   Dg Chest Port 1 View  12/27/2013   CLINICAL DATA:  Followup respiratory failure  EXAM: PORTABLE CHEST - 1 VIEW  COMPARISON:  Portable chest x-ray of 12/26/2013  FINDINGS: There is little change in probable pulmonary vascular congestion and possible small effusions. Aeration may have improved minimally. Cardiomegaly is stable. No bony abnormality is seen.  IMPRESSION: Little change in probable moderate pulmonary vascular congestion with cardiomegaly.   Electronically Signed   By: Ivar Drape M.D.   On: 12/27/2013 08:05   Dg Chest Portable 1 View  12/26/2013   CLINICAL DATA:  Respiratory distress  EXAM: PORTABLE CHEST - 1 VIEW  COMPARISON:  10/06/2013  FINDINGS: There is borderline cardiomegaly. Negative upper mediastinal contours for technique. Diffuse interstitial opacity with airspace densities at both bases. No definitive effusion. No pneumothorax.  Surgical clips in the left chest from lumpectomy.  IMPRESSION: Diffuse lung opacity, pattern favoring pulmonary edema over multi focal infection.   Electronically Signed   By: Jorje Guild M.D.   On: 12/26/2013 06:55    History of Present Illness: 89 F c MMP and ESCOPD was hospitalized may c PNA and COPD exacerbation and was D/ced to [facility] where she has resided since then.  Her QOL  has deteriorated in the last few yrs.  She has been O2 dependent for awhile and has been more recently wheelchair bound. On August 17 she  was transported to ED c Hypotensive, hypoxic-hypercarbic respiratory failure, Volume overload/pulmonary edema pattern on CXR. NPPV initiated in ED and she was [facility]   Hospital Course: Admitted 8/17 c Hypoxic/hypercapnic Resp Faiulre secondary to COPD exacerbation and and Vol Overload.  She was hypotensive and looked aweful.  She was started on BiPAP and admitted to ICU.  DNR confirmed.  She was medically resuscitated.  8/18 Rough day with severe resp distress off BiPAP.  8/18 Echocardiogram: LVEF 55-60%. Evidence of diastolic dysfunction - not graded  8/18 Palliative Care consultation obtained for goals of care  8/19 Paroxysmal AFRVR. Diltiazem gtt ordered  8/19 AFRVR <> sinus with PACs. Rate poorly controlled when in AF. Amiodarone initiated. PRN morphine ordered. Further diuresis with Lasix ordered  8/20 in NSR on amio drip. Transitioned to PO  8/21 some improved. Transferred to telemetry  Over the next 4 days she was seen by Hospice/Palliative care and ICU team.  She was transferred to my care on 8/24th.  She has remained on Nebs, O2, anxiolytics and morphine gtt.  Any movement causes her resp rate to go to 30-40 and use sig respiratory muscles to breathe.  She is using the morphine to fight air hunger.  It was determined that she would be a good candidate for residential hospice as her life expectancy is very short.  Unfortunately no [facility] availability 01/02/14.  It is now 8/25th and we will look at residential hospice throughout [place]. She cannot return to SNF while on MSO4 gtt.  I and Dr Lovena Le have thought about change to Roxinol but she is doing well on current meds and changing if we do not have to is less than ideal.  Dr Forde Dandy note on 8/24 summarizes issues: 80 yr old white female with advanced COPD compounded by heart failure and afib with RVR. Friday evening patient developed acute distress requiring initiation of morphine drip. Patient at this time  comfortable but status changes quickly.  1. DNR  2. COPD with exacerbation : holding her own on Morphine 0.5 mg/hr able to be interactive at that rate. No boluses yesterday or today noted. Patient has opted to not use bipap again for rescue. Continue aggressive pulmonary toilet.  3. Plan to transition if bed available, to hospice facility setting.  4. Afib with RVR: continue amiodarone and metoprolol  5. Obstipation: no bowel movement since the 17th. Senna added will add miralax.  6. Failure to thrive; patient eating only 5-10 % of meals   Barbette Or LCSW spoke c pt and family late 8/24th and Clinical Social Worker continuing to follow patient and family for support and discharge planning needs. CSW spoke with patient daughter over the phone this afternoon regarding disposition plans. Patient daughter is now agreeable with [facility] coming to assess patient. CSW to follow up with [place] in the morning and facilitate patient discharge needs.    ESCOPD now Hospice Care/Comfort Measures.   Not eating.  No BM.  No Pain.  No new C/o.  She is awake and conversant.  Remains on 6L [place]+ FIO2. SOB and wheezing noted  Her fan helps  Mentally better than Wednesday but has in and out  She says she is comfortable and not anxious despite appearing that way  Day of Discharge Exam BP 123/55  Pulse 75  Temp(Src) 98.2 F (36.8 C) (Oral)  Resp 18  Ht 5' (1.524 m)  Wt 85.095 kg (187 lb 9.6 oz)  BMI 36.64 kg/m2  SpO2 95%  Physical Exam: General appearance: Tachypnic and accessory muscles to breathe  Throat: dry Resp: Tachypnic and accessory muscles to breathe  Cardio: Irreg, m  GI: Distended  Extremities: no clubbing, cyanosis or edema  Discharge Labs: No results found for this basename: NA, K, CL, CO2, GLUCOSE, BUN, CREATININE, CALCIUM, MG, PHOS,  in the last 72 hours No results found for this basename: AST, ALT, ALKPHOS, BILITOT, PROT,  ALBUMIN,  in the last 72 hours No results found for this basename: WBC, NEUTROABS, HGB, HCT, MCV, PLT,  in the last 72 hours No results found for this basename: CKTOTAL, CKMB, CKMBINDEX, TROPONINI,  in the last 72 hours No results found for this basename: TSH, T4TOTAL, FREET3, T3FREE, THYROIDAB,  in the last 72 hours No results found for this basename: VITAMINB12, FOLATE, FERRITIN, TIBC, IRON, RETICCTPCT,  in the last 72 hours Lab Results  Component Value Date   INR 1.11 12/26/2013       Discharge instructions:  03-Skilled Nursing Facility    Disposition: Hospice home  Follow-up Appts: None  Condition on Discharge: terminal  Tests Needing Follow-up: none  Time spent in discharge (includes decision making & examination of pt): 45 min  Signed: Milta Croson M 01/03/2014, 7:54 AM

## 2014-01-03 NOTE — Progress Notes (Signed)
01/03/2014 2:24 PM Nursing note Report called to [facility], questions addressed. Iv site left in place per request due to resumption of morphine drip upon arrival to facility. Pt. Non-tele. Pt. Family at bedside. Pt. D/c [facility] per orders. Whitney Hillegass, Arville Lime

## 2014-01-03 NOTE — Progress Notes (Addendum)
01/03/2014 2:15 PM Nursing note  0.5 mg morphine bolus given upon arrival of EMS prior to d/c [facility] per prior order Dr. Virgina Jock. This was to last until resumption of iv morphine drip could be started upon arrival to facility. Verified with Landis Martins RN. Pt. Tolerated well.  Mourad Cwikla, Arville Lime

## 2014-01-03 NOTE — Clinical Social Work Note (Signed)
Clinical Social Worker facilitated patient discharge including contacting patient family and facility to confirm patient discharge plans.  Clinical information faxed to facility by facility liaison and family agreeable with plan.  CSW arranged ambulance transport via [facility] to [place].  RN to call report prior to discharge.  Clinical Social Worker will sign off for now as social work intervention is no longer needed. Please consult [name] again if new need arises.  Barbette Or, [place]

## 2014-01-04 NOTE — Care Management Note (Signed)
    Page 1 of 1   01/04/2014     3:50:33 PM CARE MANAGEMENT NOTE 01/04/2014  Patient:  Melinda Mora, Melinda Mora   Account Number:  [id number]  Date Initiated:  12/26/2013  Documentation initiated by:  Marvetta Gibbons  Subjective/Objective Assessment:   Pt admitted with resp. distress     Action/Plan:   PTA pt lived at [place] consulted   Anticipated DC Date:  01/03/2014   Anticipated DC Plan:  [place]  In-house referral  Clinical Social Worker      DC Planning Services  CM consult      Choice offered to / List presented to:             Status of service:  Completed, signed off Medicare Important Message given?  YES (If response is "NO", the following Medicare IM given date fields will be blank) Date Medicare IM given:  01/02/2014 Medicare IM given by:  Danell Verno Date Additional Medicare IM given:   Additional Medicare IM given by:    Discharge Disposition:  [place]  Per UR Regulation:  Reviewed for med. necessity/level of care/duration of stay  If discussed at [place] of Stay Meetings, dates discussed:   01/04/2014    Comments:  01/03/14 Ellan Lambert, RN, BSN [phone] Pt discharged to [place] on 01/03/14, per CSW arrangements.

## 2014-01-10 ENCOUNTER — Telehealth: Payer: Self-pay

## 2014-01-10 NOTE — Telephone Encounter (Signed)
Patient died per Obit[facility] °

## 2014-01-10 DEATH — deceased

## 2014-03-13 ENCOUNTER — Encounter (HOSPITAL_COMMUNITY): Payer: Self-pay | Admitting: Emergency Medicine

## 2014-06-09 IMAGING — US US PELVIS COMPLETE
1 series · 14 of 25 positions shown · non-contrast
Comparison: None

CLINICAL DATA: Bleeding, recent hysteroscopy and D&C.

TRANSABDOMINAL AND TRANSVAGINAL ULTRASOUND OF PELVIS
TECHNIQUE: Both transabdominal and transvaginal ultrasound
examinations of the pelvis were performed. Transabdominal technique
was performed for global imaging of the pelvis including uterus,
ovaries, adnexal regions, and pelvic cul-de-sac.

[Series 1: us transvaginal non-ob · 40 acquisitions, 14 frames shown]
[im 1/40]
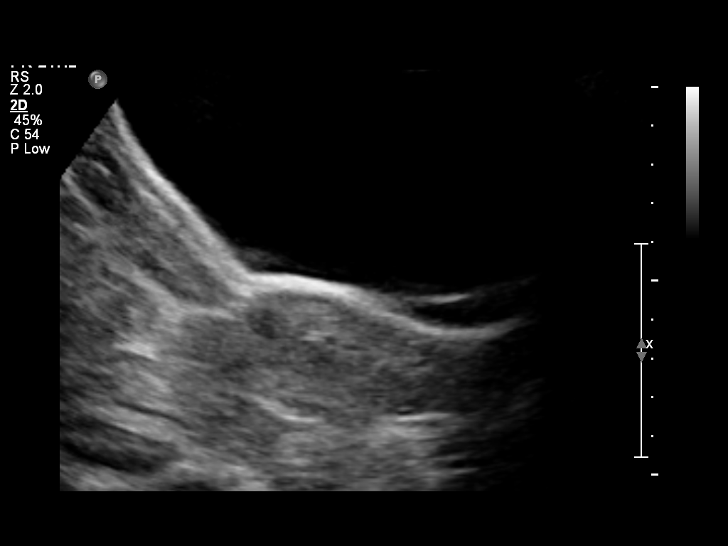
[im 4/40]
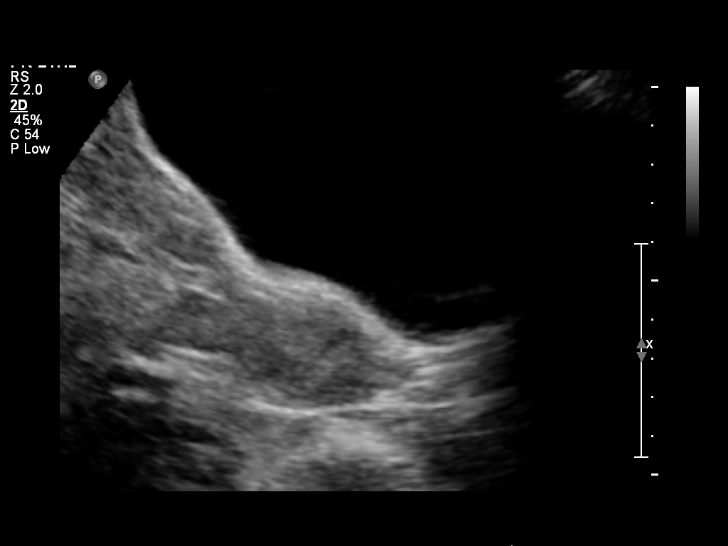
[im 7/40]
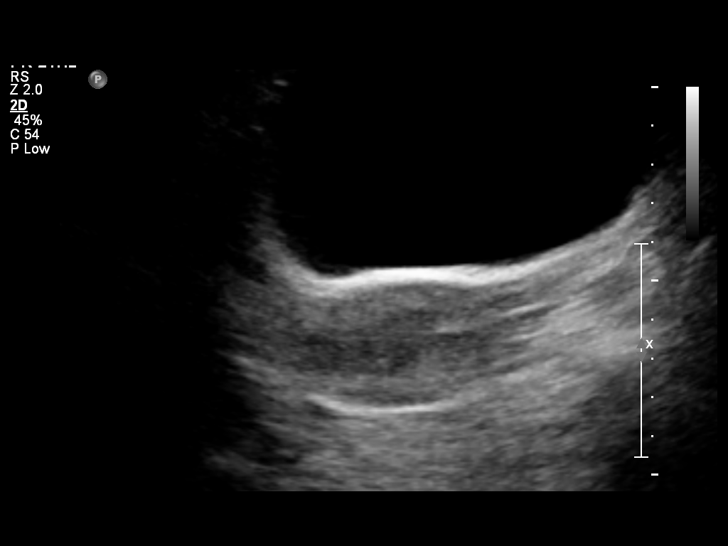
[im 10/40]
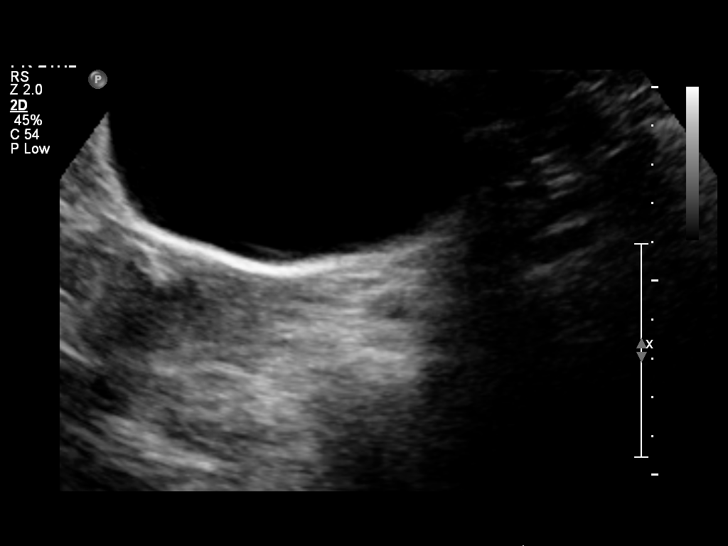
[im 14/40]
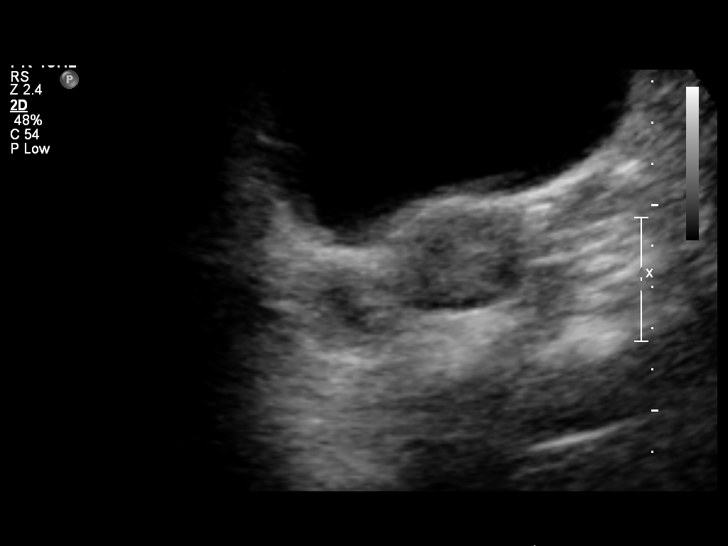
[im 15/40]
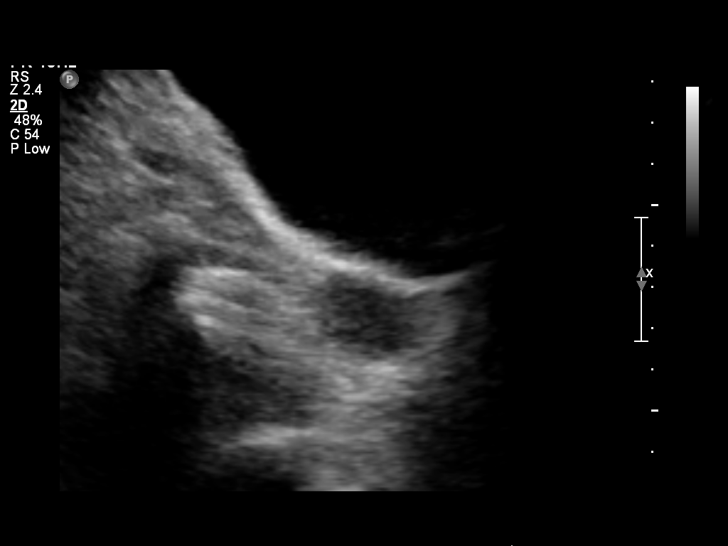
[im 18/40]
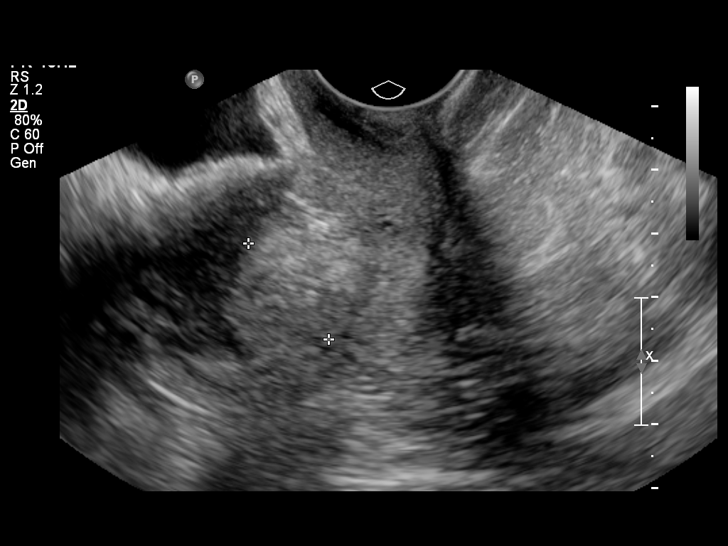
[im 22/40]
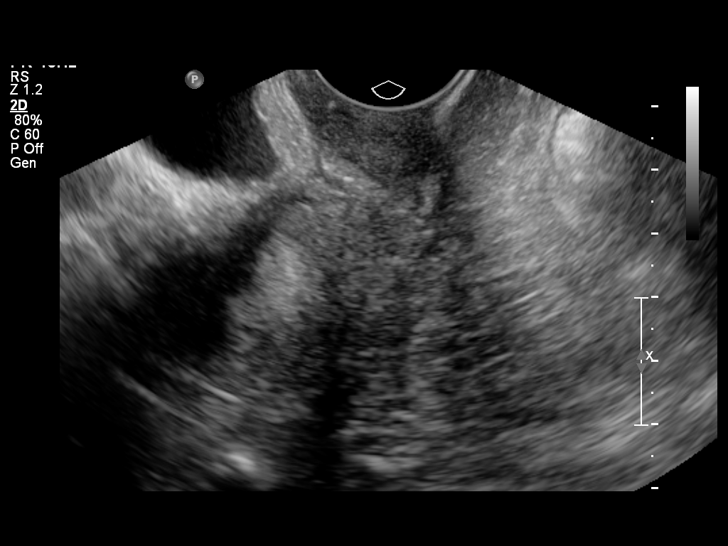
[im 25/40]
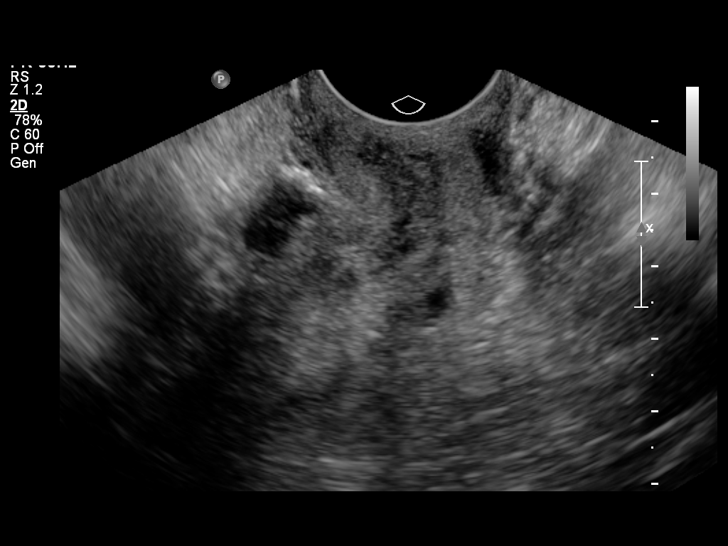
[im 27/40]
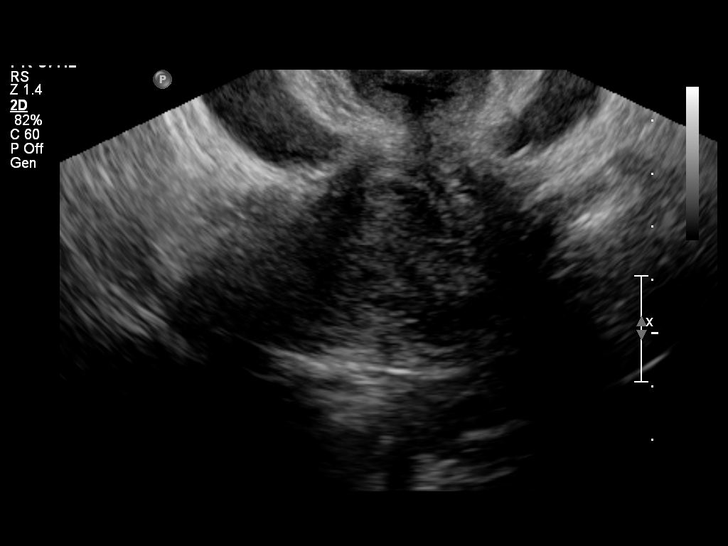
[im 30/40]
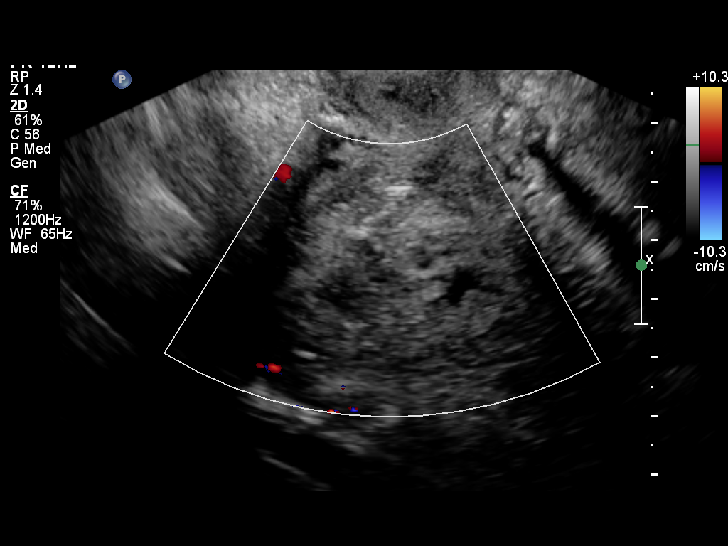
[im 33/40]
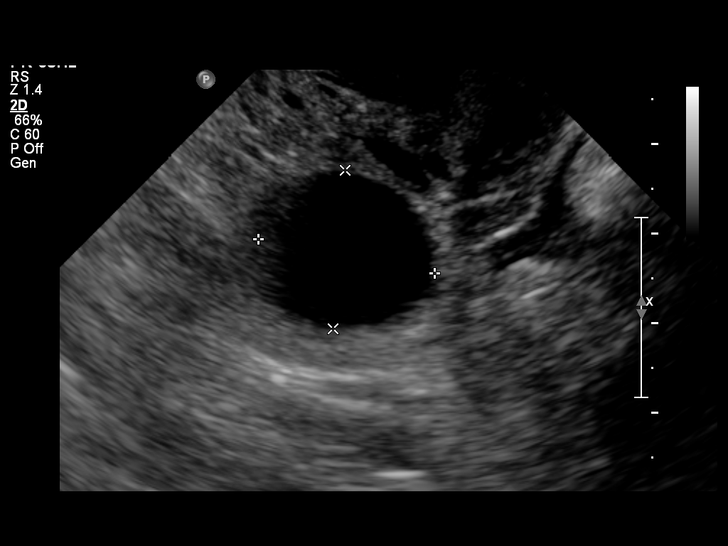
[im 36/40]
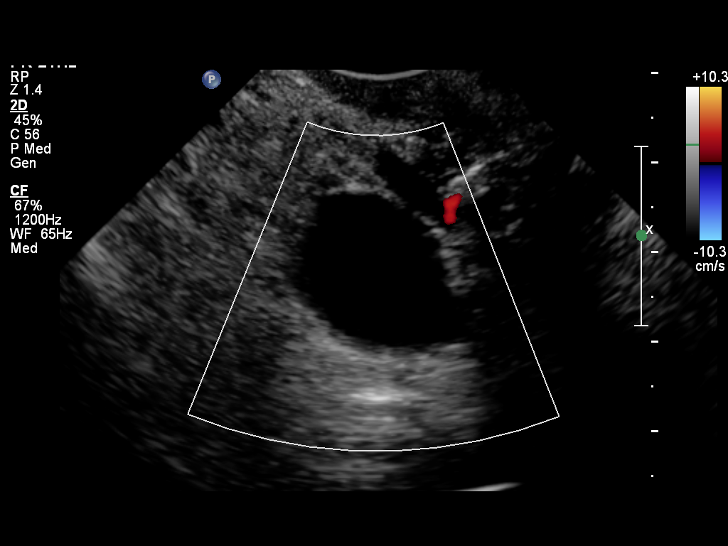
[im 40/40]
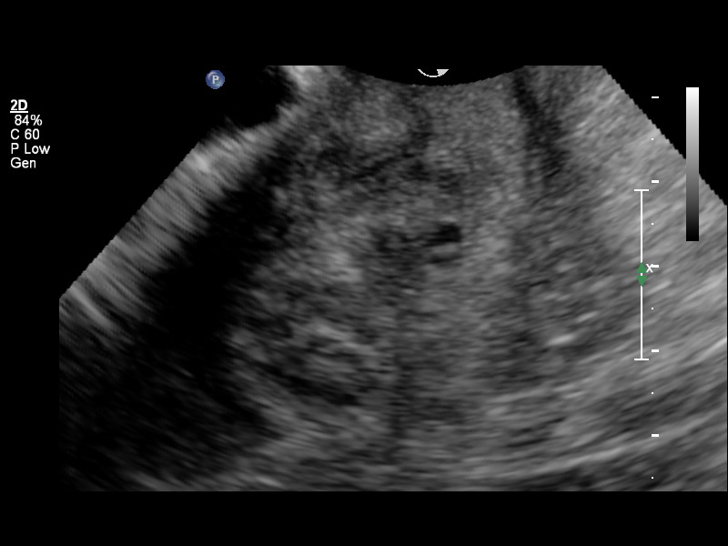

[14 of 25 positions shown; findings below may reference images not displayed]

It was necessary to proceed with endovaginal exam following the
transabdominal exam to visualize the endometrium and adnexa.
FINDINGS: Uterus: Normal in size and appearance, measuring 6.1 x 3.5 x
cm.

Endometrium: Thickened and heterogeneous in echogenicity, measuring
19.7 mm.

Right ovary:  Not visualized.  There is a simple appearing right
adnexal cyst measuring 2.0 x 1.8 x 1.6 cm.

Left ovary: Not visualized

Other findings: No free fluid
IMPRESSION: Thickened/heterogeneous appearance to the endometrium is
nonspecific. Given the recent history of hysteroscopy, may in part
represent blood clot with underlying hyperplasia or mass.
Recommend correlation with pathology results from recent procedure.

## 2016-08-18 IMAGING — CR DG CHEST 1V PORT
1 series · 1 of 1 positions shown · non-contrast
Comparison: December 27, 2013.

CLINICAL DATA: Respiratory failure.

EXAM:
PORTABLE CHEST - 1 VIEW

[AP]
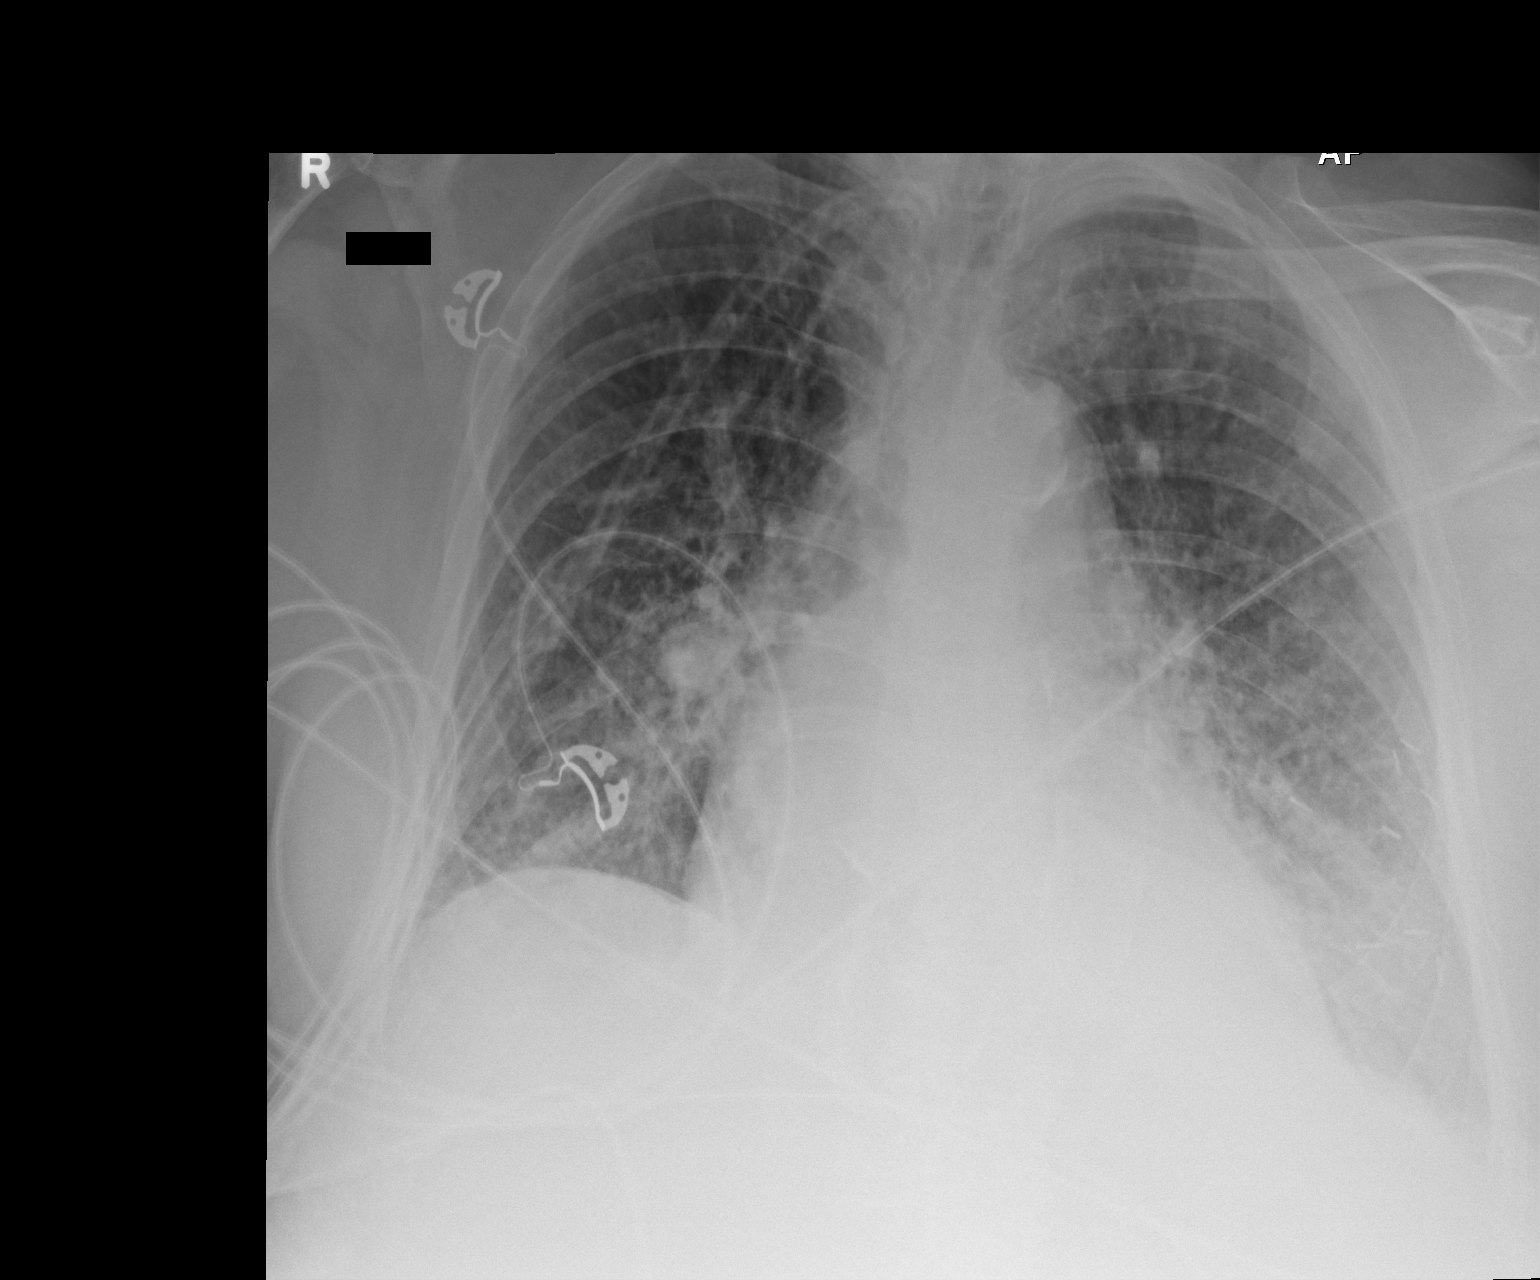

[1 of 1 positions shown; findings below may reference images not displayed]

FINDINGS: Stable cardiomediastinal silhouette. Central pulmonary vascular
congestion and bilateral pulmonary edema is again noted and
unchanged compared to prior exam. No pneumothorax is noted. Minimal
left pleural effusion is noted. Bony thorax appears intact.
IMPRESSION: Stable central pulmonary vascular congestion and bilateral pulmonary
edema.

## 2016-08-19 IMAGING — CR DG CHEST 1V PORT
1 series · 1 of 1 positions shown · non-contrast
Comparison: 12/28/2013

CLINICAL DATA: Respiratory failure.

EXAM:
PORTABLE CHEST - 1 VIEW

[AP]
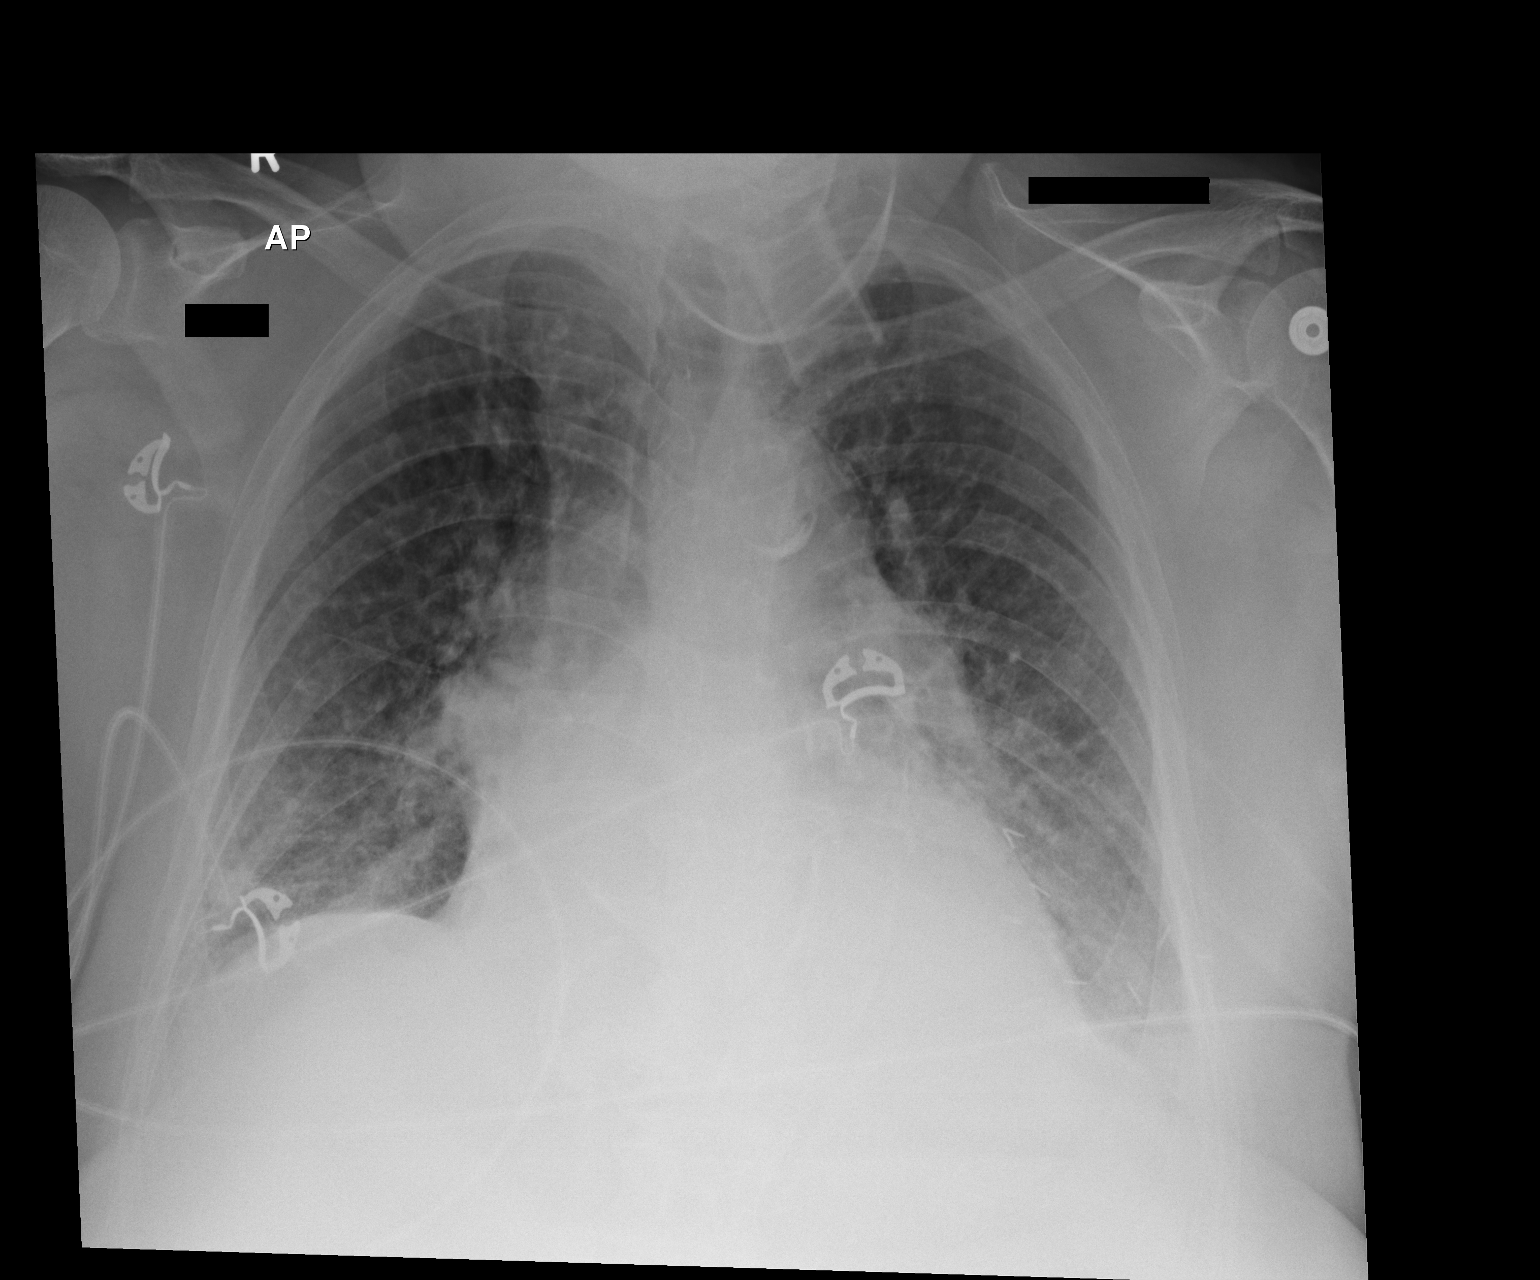

[1 of 1 positions shown; findings below may reference images not displayed]

FINDINGS: Lungs are adequately inflated with continued indistinctness of the
perihilar regions and infrahilar regions left worse than right with
slight interval improvement suggesting improving mild interstitial
edema/ vascular congestion. Cannot exclude a small amount of left
pleural fluid. There is mild stable cardiomegaly. There is moderate
calcified plaque involving the thoracic and abdominal aorta.
Remainder the exam is unchanged.
IMPRESSION: Findings likely representing mild interstitial edema/vascular
congestion with slight interval improvement. Possible small amount
of left pleural fluid.

Mild stable cardiomegaly.
